# Patient Record
Sex: Female | Born: 1970 | Race: White | Hispanic: Yes | Marital: Married | Smoking: Never smoker
Health system: Southern US, Community
[De-identification: ages and names within clinical notes are randomized; demographics above are authoritative.]

## PROBLEM LIST (undated history)

## (undated) DIAGNOSIS — J45909 Unspecified asthma, uncomplicated: Secondary | ICD-10-CM

## (undated) DIAGNOSIS — T8859XA Other complications of anesthesia, initial encounter: Secondary | ICD-10-CM

## (undated) DIAGNOSIS — J40 Bronchitis, not specified as acute or chronic: Secondary | ICD-10-CM

## (undated) DIAGNOSIS — N882 Stricture and stenosis of cervix uteri: Secondary | ICD-10-CM

## (undated) DIAGNOSIS — N950 Postmenopausal bleeding: Secondary | ICD-10-CM

## (undated) DIAGNOSIS — R7303 Prediabetes: Secondary | ICD-10-CM

## (undated) DIAGNOSIS — I9581 Postprocedural hypotension: Secondary | ICD-10-CM

## (undated) DIAGNOSIS — D259 Leiomyoma of uterus, unspecified: Secondary | ICD-10-CM

## (undated) DIAGNOSIS — N939 Abnormal uterine and vaginal bleeding, unspecified: Secondary | ICD-10-CM

## (undated) DIAGNOSIS — E559 Vitamin D deficiency, unspecified: Secondary | ICD-10-CM

## (undated) DIAGNOSIS — K2970 Gastritis, unspecified, without bleeding: Secondary | ICD-10-CM

## (undated) DIAGNOSIS — E538 Deficiency of other specified B group vitamins: Secondary | ICD-10-CM

## (undated) DIAGNOSIS — D649 Anemia, unspecified: Secondary | ICD-10-CM

## (undated) HISTORY — PX: TONSILLECTOMY: SUR1361

## (undated) HISTORY — PX: KNEE SURGERY: SHX244

## (undated) HISTORY — PX: CHOLECYSTECTOMY: SHX55

## (undated) HISTORY — PX: OVARY SURGERY: SHX727

## (undated) HISTORY — DX: Vitamin D deficiency, unspecified: E55.9

## (undated) HISTORY — PX: LAPAROSCOPIC APPENDECTOMY: SHX408

## (undated) HISTORY — DX: Gastritis, unspecified, without bleeding: K29.70

## (undated) HISTORY — DX: Anemia, unspecified: D64.9

## (undated) HISTORY — PX: BREAST SURGERY: SHX581

## (undated) HISTORY — DX: Deficiency of other specified B group vitamins: E53.8

---

## 1993-04-02 HISTORY — PX: TONSILLECTOMY: SUR1361

## 1996-04-02 HISTORY — PX: EXCISION OF BREAST BIOPSY: SHX5822

## 1998-07-05 ENCOUNTER — Ambulatory Visit (HOSPITAL_COMMUNITY): Admission: RE | Admit: 1998-07-05 | Discharge: 1998-07-05 | Payer: Self-pay | Admitting: Family Medicine

## 1998-07-27 ENCOUNTER — Ambulatory Visit (HOSPITAL_COMMUNITY): Admission: RE | Admit: 1998-07-27 | Discharge: 1998-07-27 | Payer: Self-pay | Admitting: *Deleted

## 2000-02-19 ENCOUNTER — Ambulatory Visit (HOSPITAL_COMMUNITY): Admission: RE | Admit: 2000-02-19 | Discharge: 2000-02-19 | Payer: Self-pay | Admitting: *Deleted

## 2000-02-19 ENCOUNTER — Encounter: Payer: Self-pay | Admitting: *Deleted

## 2000-06-06 ENCOUNTER — Encounter: Payer: Self-pay | Admitting: Family Medicine

## 2000-06-06 ENCOUNTER — Encounter: Admission: RE | Admit: 2000-06-06 | Discharge: 2000-06-06 | Payer: Self-pay | Admitting: Family Medicine

## 2001-01-02 ENCOUNTER — Emergency Department (HOSPITAL_COMMUNITY): Admission: EM | Admit: 2001-01-02 | Discharge: 2001-01-02 | Payer: Self-pay | Admitting: Emergency Medicine

## 2001-06-06 ENCOUNTER — Encounter: Payer: Self-pay | Admitting: Family Medicine

## 2001-06-06 ENCOUNTER — Ambulatory Visit (HOSPITAL_COMMUNITY): Admission: RE | Admit: 2001-06-06 | Discharge: 2001-06-06 | Payer: Self-pay | Admitting: Family Medicine

## 2001-07-10 LAB — CONVERTED CEMR LAB: Pap Smear: NORMAL

## 2002-09-24 ENCOUNTER — Ambulatory Visit: Admission: EM | Admit: 2002-09-24 | Discharge: 2002-09-24 | Payer: Self-pay | Admitting: Emergency Medicine

## 2003-05-25 ENCOUNTER — Ambulatory Visit (HOSPITAL_COMMUNITY): Admission: RE | Admit: 2003-05-25 | Discharge: 2003-05-25 | Payer: Self-pay | Admitting: Orthopedic Surgery

## 2003-05-25 HISTORY — PX: KNEE ARTHROSCOPY: SUR90

## 2003-08-20 ENCOUNTER — Ambulatory Visit (HOSPITAL_COMMUNITY): Admission: AD | Admit: 2003-08-20 | Discharge: 2003-08-22 | Payer: Self-pay | Admitting: Orthopedic Surgery

## 2003-08-20 HISTORY — PX: KNEE ARTHROSCOPY W/ SYNOVECTOMY: SHX1887

## 2004-06-19 ENCOUNTER — Ambulatory Visit: Payer: Self-pay | Admitting: Family Medicine

## 2004-06-20 ENCOUNTER — Ambulatory Visit: Payer: Self-pay | Admitting: *Deleted

## 2004-07-07 ENCOUNTER — Ambulatory Visit: Payer: Self-pay | Admitting: Family Medicine

## 2004-07-25 ENCOUNTER — Ambulatory Visit: Payer: Self-pay | Admitting: Family Medicine

## 2004-09-12 ENCOUNTER — Ambulatory Visit: Payer: Self-pay | Admitting: Family Medicine

## 2004-12-20 ENCOUNTER — Ambulatory Visit: Payer: Self-pay | Admitting: Family Medicine

## 2005-05-04 ENCOUNTER — Ambulatory Visit: Payer: Self-pay | Admitting: Family Medicine

## 2005-06-04 ENCOUNTER — Ambulatory Visit: Payer: Self-pay | Admitting: Family Medicine

## 2005-06-06 ENCOUNTER — Ambulatory Visit: Payer: Self-pay | Admitting: Family Medicine

## 2005-06-07 ENCOUNTER — Ambulatory Visit (HOSPITAL_COMMUNITY): Admission: RE | Admit: 2005-06-07 | Discharge: 2005-06-07 | Payer: Self-pay | Admitting: Family Medicine

## 2005-11-01 ENCOUNTER — Ambulatory Visit: Payer: Self-pay | Admitting: Family Medicine

## 2005-11-06 ENCOUNTER — Encounter: Admission: RE | Admit: 2005-11-06 | Discharge: 2005-11-06 | Payer: Self-pay | Admitting: Family Medicine

## 2005-11-06 DIAGNOSIS — N63 Unspecified lump in unspecified breast: Secondary | ICD-10-CM

## 2006-06-18 ENCOUNTER — Ambulatory Visit: Payer: Self-pay | Admitting: Internal Medicine

## 2006-08-02 ENCOUNTER — Ambulatory Visit: Payer: Self-pay | Admitting: Internal Medicine

## 2006-08-09 ENCOUNTER — Ambulatory Visit (HOSPITAL_COMMUNITY): Admission: RE | Admit: 2006-08-09 | Discharge: 2006-08-09 | Payer: Self-pay | Admitting: Internal Medicine

## 2006-10-18 ENCOUNTER — Encounter (INDEPENDENT_AMBULATORY_CARE_PROVIDER_SITE_OTHER): Payer: Self-pay | Admitting: General Surgery

## 2006-10-18 ENCOUNTER — Ambulatory Visit (HOSPITAL_COMMUNITY): Admission: RE | Admit: 2006-10-18 | Discharge: 2006-10-19 | Payer: Self-pay | Admitting: General Surgery

## 2006-10-18 HISTORY — PX: CHOLECYSTECTOMY, LAPAROSCOPIC: SHX56

## 2006-12-18 ENCOUNTER — Encounter (INDEPENDENT_AMBULATORY_CARE_PROVIDER_SITE_OTHER): Payer: Self-pay | Admitting: *Deleted

## 2006-12-24 ENCOUNTER — Telehealth (INDEPENDENT_AMBULATORY_CARE_PROVIDER_SITE_OTHER): Payer: Self-pay | Admitting: *Deleted

## 2006-12-30 DIAGNOSIS — J45909 Unspecified asthma, uncomplicated: Secondary | ICD-10-CM

## 2006-12-30 DIAGNOSIS — M25569 Pain in unspecified knee: Secondary | ICD-10-CM

## 2006-12-30 DIAGNOSIS — K219 Gastro-esophageal reflux disease without esophagitis: Secondary | ICD-10-CM

## 2007-01-17 ENCOUNTER — Encounter (INDEPENDENT_AMBULATORY_CARE_PROVIDER_SITE_OTHER): Payer: Self-pay | Admitting: Internal Medicine

## 2007-01-17 ENCOUNTER — Ambulatory Visit: Payer: Self-pay | Admitting: Internal Medicine

## 2007-01-17 DIAGNOSIS — J309 Allergic rhinitis, unspecified: Secondary | ICD-10-CM | POA: Insufficient documentation

## 2007-01-17 LAB — CONVERTED CEMR LAB
Bilirubin Urine: NEGATIVE
Blood in Urine, dipstick: NEGATIVE
Glucose, Urine, Semiquant: NEGATIVE
Ketones, urine, test strip: NEGATIVE
pH: 7

## 2007-01-23 ENCOUNTER — Encounter (INDEPENDENT_AMBULATORY_CARE_PROVIDER_SITE_OTHER): Payer: Self-pay | Admitting: Internal Medicine

## 2007-01-23 ENCOUNTER — Encounter (INDEPENDENT_AMBULATORY_CARE_PROVIDER_SITE_OTHER): Payer: Self-pay | Admitting: *Deleted

## 2007-01-23 LAB — CONVERTED CEMR LAB
CO2: 22 meq/L (ref 19–32)
Chlamydia, DNA Probe: NEGATIVE
Cholesterol: 89 mg/dL (ref 0–200)
GC Probe Amp, Genital: NEGATIVE
Glucose, Bld: 84 mg/dL (ref 70–99)
HDL: 31 mg/dL — ABNORMAL LOW (ref >39)
Lymphocytes Relative: 42 % (ref 12–46)
Lymphs Abs: 1.8 10*3/uL (ref 0.7–3.3)
Neutrophils Relative %: 37 % — ABNORMAL LOW (ref 43–77)
Platelets: 260 10*3/uL (ref 150–400)
Potassium: 4.5 meq/L (ref 3.5–5.3)
Sodium: 140 meq/L (ref 135–145)
Total CHOL/HDL Ratio: 2.9
VLDL: 12 mg/dL (ref 0–40)
WBC: 4.2 10*3/uL (ref 4.0–10.5)

## 2007-02-02 DIAGNOSIS — D509 Iron deficiency anemia, unspecified: Secondary | ICD-10-CM

## 2007-02-06 LAB — CONVERTED CEMR LAB
Iron: 26 ug/dL — ABNORMAL LOW (ref 42–145)
UIBC: 388 ug/dL

## 2007-08-12 ENCOUNTER — Ambulatory Visit: Payer: Self-pay | Admitting: Internal Medicine

## 2007-08-12 DIAGNOSIS — R5383 Other fatigue: Secondary | ICD-10-CM

## 2007-08-12 DIAGNOSIS — R5381 Other malaise: Secondary | ICD-10-CM

## 2007-08-17 LAB — CONVERTED CEMR LAB
AST: 17 units/L (ref 0–37)
Alkaline Phosphatase: 51 units/L (ref 39–117)
BUN: 12 mg/dL (ref 6–23)
Basophils Relative: 1 % (ref 0–1)
Creatinine, Ser: 0.51 mg/dL (ref 0.40–1.20)
Eosinophils Absolute: 0.2 10*3/uL (ref 0.0–0.7)
Eosinophils Relative: 5 % (ref 0–5)
Glucose, Bld: 99 mg/dL (ref 70–99)
HCT: 41 % (ref 36.0–46.0)
MCHC: 31.2 g/dL (ref 30.0–36.0)
MCV: 92.1 fL (ref 78.0–100.0)
Monocytes Relative: 14 % — ABNORMAL HIGH (ref 3–12)
Neutrophils Relative %: 41 % — ABNORMAL LOW (ref 43–77)
Potassium: 4.4 meq/L (ref 3.5–5.3)
RBC: 4.45 M/uL (ref 3.87–5.11)
Total Bilirubin: 0.6 mg/dL (ref 0.3–1.2)

## 2007-08-18 ENCOUNTER — Encounter (INDEPENDENT_AMBULATORY_CARE_PROVIDER_SITE_OTHER): Payer: Self-pay | Admitting: *Deleted

## 2007-09-02 ENCOUNTER — Telehealth (INDEPENDENT_AMBULATORY_CARE_PROVIDER_SITE_OTHER): Payer: Self-pay | Admitting: Internal Medicine

## 2007-11-18 ENCOUNTER — Telehealth (INDEPENDENT_AMBULATORY_CARE_PROVIDER_SITE_OTHER): Payer: Self-pay | Admitting: Internal Medicine

## 2007-11-19 ENCOUNTER — Ambulatory Visit: Payer: Self-pay | Admitting: Family Medicine

## 2007-11-19 DIAGNOSIS — M940 Chondrocostal junction syndrome [Tietze]: Secondary | ICD-10-CM

## 2008-05-24 ENCOUNTER — Telehealth (INDEPENDENT_AMBULATORY_CARE_PROVIDER_SITE_OTHER): Payer: Self-pay | Admitting: Internal Medicine

## 2008-05-25 ENCOUNTER — Ambulatory Visit: Payer: Self-pay | Admitting: Internal Medicine

## 2008-05-25 DIAGNOSIS — J069 Acute upper respiratory infection, unspecified: Secondary | ICD-10-CM | POA: Insufficient documentation

## 2008-08-11 ENCOUNTER — Ambulatory Visit: Payer: Self-pay | Admitting: Nurse Practitioner

## 2008-08-11 DIAGNOSIS — J020 Streptococcal pharyngitis: Secondary | ICD-10-CM | POA: Insufficient documentation

## 2008-08-11 LAB — CONVERTED CEMR LAB: Rapid Strep: POSITIVE

## 2009-04-22 ENCOUNTER — Emergency Department (HOSPITAL_COMMUNITY): Admission: EM | Admit: 2009-04-22 | Discharge: 2009-04-22 | Payer: Self-pay | Admitting: Emergency Medicine

## 2009-04-26 ENCOUNTER — Ambulatory Visit: Payer: Self-pay | Admitting: Internal Medicine

## 2009-05-26 ENCOUNTER — Ambulatory Visit: Payer: Self-pay | Admitting: Internal Medicine

## 2009-05-26 DIAGNOSIS — L0293 Carbuncle, unspecified: Secondary | ICD-10-CM

## 2009-05-26 DIAGNOSIS — L0292 Furuncle, unspecified: Secondary | ICD-10-CM | POA: Insufficient documentation

## 2009-05-26 LAB — CONVERTED CEMR LAB
Bilirubin Urine: NEGATIVE
Glucose, Urine, Semiquant: NEGATIVE
KOH Prep: NEGATIVE
Ketones, urine, test strip: NEGATIVE
Pap Smear: NEGATIVE
Protein, U semiquant: NEGATIVE
Whiff Test: NEGATIVE
pH: 7.5

## 2009-06-04 LAB — CONVERTED CEMR LAB
AST: 17 units/L (ref 0–37)
Alkaline Phosphatase: 51 units/L (ref 39–117)
BUN: 11 mg/dL (ref 6–23)
Basophils Absolute: 0 10*3/uL (ref 0.0–0.1)
Basophils Relative: 1 % (ref 0–1)
Chlamydia, DNA Probe: NEGATIVE
Eosinophils Relative: 3 % (ref 0–5)
GC Probe Amp, Genital: NEGATIVE
Glucose, Bld: 86 mg/dL (ref 70–99)
Hemoglobin: 10.8 g/dL — ABNORMAL LOW (ref 12.0–15.0)
Lymphocytes Relative: 42 % (ref 12–46)
MCHC: 30.3 g/dL (ref 30.0–36.0)
Monocytes Absolute: 0.7 10*3/uL (ref 0.1–1.0)
Neutro Abs: 2.3 10*3/uL (ref 1.7–7.7)
Platelets: 287 10*3/uL (ref 150–400)
Potassium: 4.3 meq/L (ref 3.5–5.3)
RDW: 14.2 % (ref 11.5–15.5)
Sodium: 139 meq/L (ref 135–145)
Total Bilirubin: 0.4 mg/dL (ref 0.3–1.2)

## 2009-12-19 ENCOUNTER — Ambulatory Visit: Payer: Self-pay | Admitting: Internal Medicine

## 2009-12-19 DIAGNOSIS — R21 Rash and other nonspecific skin eruption: Secondary | ICD-10-CM

## 2009-12-30 ENCOUNTER — Ambulatory Visit: Payer: Self-pay | Admitting: Internal Medicine

## 2010-05-02 NOTE — Progress Notes (Signed)
Summary: Office Visit/DEPRESSION SCREENING  Office Visit/DEPRESSION SCREENING   Imported By: Arta Bruce 07/19/2009 14:20:20  _____________________________________________________________________  External Attachment:    Type:   Image     Comment:   External Document

## 2010-05-02 NOTE — Assessment & Plan Note (Signed)
Summary: rash///cns   Vital Signs:  Patient profile:   40 year old female Menstrual status:  regular LMP:     12/05/2009 Weight:      182.6 pounds Temp:     97.6 degrees F Pulse rate:   78 / minute BP sitting:   114 / 78  (left arm) Cuff size:   large  Vitals Entered By: Michelle Nasuti (December 30, 2009 11:55 AM) CC: rash on breast. seen last friday. pt states rash is doing much better. c/o L leg pain Pain Assessment Patient in pain? yes      Intensity: 8 LMP (date): 12/05/2009 LMP - Character: heavy Menarche (age onset years): 15   Menses interval (days): 28 Menstrual flow (days): 5 Enter LMP: 12/05/2009 Last PAP Result NEGATIVE FOR INTRAEPITHELIAL LESIONS OR MALIGNANCY.   Primary Care Silvester Reierson:  Julieanne Manson MD  CC:  rash on breast. seen last friday. pt states rash is doing much better. c/o L leg pain.  History of Present Illness: 1.  Rash:  much better.  Was much improved after 2-3 days of treatment.  2.  Left leg pain:  Started 3 days ago.  Feels like the pain is in her muscle.  Sore to press on quadriceps area and when she walks.  Does believe she overdid with squatting down, bending too much before started.  Also has some discomfort in gluteal and lateral thigh area as well.  Not really making her change her gait.  Took Naproxen -- 2 of OTC brand.  Helped only a bit.  No back pain.    Allergies: No Known Drug Allergies  Physical Exam  Extremities:  Full ROM of hip and knee, but some anterior discomfort with knee flexion.  Quadriceps group tender on palpation.  No overlying swelling, erythema or significant contusion.  No palpable abnormality. Minimal tenderness over greater trochanter  Skin:  Rash about chest resolved   Impression & Recommendations:  Problem # 1:  SKIN RASH (ICD-782.1) Resolved  Problem # 2:  QUADRICEPS STRAIN (ICD-844.8)-left Warm packs and gentle stretching. Naproxen 500 mg two times a day with food as needed   Complete  Medication List: 1)  Albuterol Sulfate (2.5 Mg/13ml) 0.083% Nebu (Albuterol sulfate) .... 2 puffs q4 h as needed wheezing 2)  Advair Diskus 100-50 Mcg/dose Misc (Fluticasone-salmeterol) .Marland Kitchen.. 1 inhalation two times a day 3)  Nasacort Aq 55 Mcg/act Aers (Triamcinolone acetonide(nasal)) .... 2 sprays each nostril daily 4)  Singulair 10 Mg Tabs (Montelukast sodium) .Marland Kitchen.. 1 tab by mouth daily 5)  Ferrous Sulfate 325 (65 Fe) Mg Tabs (Ferrous sulfate) .Marland Kitchen.. 1 tab by mouth daily 6)  Fexofenadine Hcl 180 Mg Tabs (Fexofenadine hcl) .Marland Kitchen.. 1 tab by mouth daily as needed allergies. 7)  Naproxen 500 Mg Tabs (Naproxen) .Marland Kitchen.. 1 tab by mouth two times a day with food as needed pain 8)  Hydroxyzine Hcl 25 Mg Tabs (Hydroxyzine hcl) .... Take 1 tablet by mouth two times a day as needed for severe itching  Patient Instructions: 1)  Warm compresses or soaks for 20 minutes two times a day to leg 2)  Call if it does not improve in next week--gentle stretching as we discussed Prescriptions: NAPROXEN 500 MG TABS (NAPROXEN) 1 tab by mouth two times a day with food as needed pain  #60 x 1   Entered and Authorized by:   Julieanne Manson MD   Signed by:   Julieanne Manson MD on 12/30/2009   Method used:   Electronically to  [facility] [address] [phone]* (retail)       [address]       [place], [place]  [address]       Ph: [phone]       Fax: [phone]   RxID:   [id number]

## 2010-05-02 NOTE — Assessment & Plan Note (Signed)
Summary: BLISTER UNDER LEFT FOOT///KT   Vital Signs:  Patient profile:   40 year old female Menstrual status:  regular Weight:      185.2 pounds BMI:     31.90 Temp:     97.9 degrees F oral Pulse rate:   71 / minute Pulse rhythm:   regular Resp:     18 per minute BP sitting:   116 / 81  (left arm) Cuff size:   regular  Vitals Entered By: Geanie Cooley (April 26, 2009 4:27 PM) CC: Wart under left big toe for about 2 months smaller one forming on same foot. Is Patient Diabetic? No Pain Assessment Patient in pain? yes     Location: foot Intensity: 10 Type: stinging Onset of pain  two months  Does patient need assistance? Functional Status Self care Ambulation Normal   CC:  Wart under left big toe for about 2 months smaller one forming on same foot..  Allergies (verified): No Known Drug Allergies  Physical Exam  Skin:  1 cm calloused area with depressed center and black speckling in center on plantar aspect of left great toe.  Smaller similar lesion on ball of foot .   Impression & Recommendations:  Problem # 1:  PLANTAR WARTS, LEFT (ICD-078.12) Salicylic acid plasters--may use up to 12 weeks.  Complete Medication List: 1)  Albuterol Sulfate (2.5 Mg/35ml) 0.083% Nebu (Albuterol sulfate) .... 2 puffs q4 h as needed wheezing 2)  Advair Diskus 100-50 Mcg/dose Misc (Fluticasone-salmeterol) .Marland Kitchen.. 1 inhalation two times a day 3)  Nasacort Aq 55 Mcg/act Aers (Triamcinolone acetonide(nasal)) .... 2 sprays each nostril daily 4)  Singulair 10 Mg Tabs (Montelukast sodium) .Marland Kitchen.. 1 tab by mouth daily 5)  Ferrous Sulfate 325 (65 Fe) Mg Tabs (Ferrous sulfate) .Marland Kitchen.. 1 tab by mouth two times a day 6)  Fexofenadine Hcl 180 Mg Tabs (Fexofenadine hcl) .Marland Kitchen.. 1 tab by mouth daily as needed allergies. 7)  Naprosyn 500 Mg Tabs (Naproxen) .... Take one tablet every 12 hours for chest wall pain. take with food  Patient Instructions: 1)  Salicylic acid 40% plasters--not the discs on  bandaids--cut to fit wart--replace each day after shower and pumice stone to remove the soft white skin 2)  Pt. brought up heavy periods on way out--she will make appt. for CPP next available.  Prevention & Chronic Care Immunizations   Influenza vaccine: Fluvax 3+  (01/17/2007)    Tetanus booster: 06/18/2006: Td    Pneumococcal vaccine: Pneumovax  (01/17/2007)  Other Screening   Pap smear: normal  (07/10/2001)   Smoking status: never  (01/17/2007)  Lipids   Total Cholesterol: 89  (01/17/2007)   LDL: 46  (01/17/2007)   LDL Direct: Not documented   HDL: 31  (01/17/2007)   Triglycerides: 58  (01/17/2007)    Vital Signs:  Patient profile:   40 year old female Menstrual status:  regular Weight:      185.2 pounds BMI:     31.90 Temp:     97.9 degrees F oral Pulse rate:   71 / minute Pulse rhythm:   regular Resp:     18 per minute BP sitting:   116 / 81  (left arm) Cuff size:   regular  Vitals Entered By: Geanie Cooley (April 26, 2009 4:27 PM)

## 2010-05-02 NOTE — Assessment & Plan Note (Signed)
Summary: CPP/////RJP   Vital Signs:  Patient profile:   40 year old female Menstrual status:  regular LMP:     05/20/2009 Weight:      187.3 pounds Temp:     97.8 degrees F oral Pulse rate:   75 / minute Pulse rhythm:   regular Resp:     18 per minute BP sitting:   106 / 71  (left arm) Cuff size:   regular  Vitals Entered By: Geanie Cooley  (May 26, 2009 11:45 AM) CC: Pt here for CPP.  Pt states she has a big pimple under left butt cheek. Pt states she still has  pain under her left foot from the wart. Is Patient Diabetic? No Pain Assessment Patient in pain? yes     Location: bottom of left foot Intensity: 8 Type: stinging  Does patient need assistance? Functional Status Self care Ambulation Normal LMP (date): 05/20/2009 LMP - Character: heavy Menarche (age onset years): 15   Menses interval (days): 28 Menstrual flow (days): 5 Enter LMP: 05/20/2009 Last PAP Result normal                                          Primary Care Provider:  nd no SOB.  CC:  Pt here for CPP.  Pt states she has a big pimple under left butt cheek. Pt states she still has  pain under her left foot from the wart..  History of Present Illness: 40 yo female here for CPP  Concerns:  1.  Pimple on left buttock--has gradually enlarged over 5 days time.  3 days ago, she expressed a lot of pus from it and has enlarged and with much more discomfort.  Expressed pus again today in the shower.  2.  Plantar Wart:  Did get the plasters, but is leaving the wart open to air at times instead of reapplying a new plaster--discussed it would take longer.  Habits & Providers  Alcohol-Tobacco-Diet     Alcohol drinks/day: 0     Tobacco Status: never  Exercise-Depression-Behavior     Drug Use: never  Allergies (verified): No Known Drug Allergies  Past History:  Past Medical History: Reviewed history from 01/17/2007 and no changes required. ALLERGIC RHINITIS (ICD-477.9) ANEMIA, IRON  DEFICIENCY, HX OF (ICD-V12.3) WELL ADULT EXAM (ICD-V70.0) POSITIVE PPD (ICD-795.5) LUMP OR MASS IN BREAST (ICD-611.72) PATELLO-FEMORAL SYNDROME (ICD-719.46) GERD (ICD-530.81) ASTHMA (ICD-493.90)  Past Surgical History: Reviewed history from 01/17/2007 and no changes required. 1. 1997 (last) Csection  x 3 2.  1995  Tonsillectomy 3.  Age 13   Benign ovarian tumor resection--[place] 4.  1998  Left breast biopsy-benign--[place] 5.  1999 Left breast biopsy --benign 6.  05/2003  Tendon Repair Right Knee 7.  09/2003  Arthroscopic surgery right Knee. 8.  10/18/06  Laparoscopic Cholecystectomy.  Family History: Mother, 46, hypertension Father, 54, BPH 8 Sisters--one with Diabetes 4 Brothers--all healthy 3 children, ages 25, 12,13--19 yo son with allergies and asthma  Social History: Lives at home with 3 sons and husband. Works at [employer] job Moved to [place]. last in 2001Drug Use:  never  Review of Systems General:  Most of the time okay.  . Eyes:  Denies blurring. ENT:  Denies decreased hearing. CV:  Denies chest pain or discomfort and palpitations. Resp:  Occasionally problems with allergies and breathing. GI:  Denies bloody stools and dark tarry stools; Epigastric  pain sometimes:  Feels bloated.  If belches or passes flatus or stool, feels better.  Does have constipation and is definitely worse when she has the abdominal pain--Uses Metamucil and helps, but does not use daily.. GU:  Denies discharge, dysuria, and urinary frequency. MS:  Denies joint pain, joint redness, and joint swelling. Derm:  Denies lesion(s) and rash; Other than what noted in HPI. Neuro:  Denies numbness, tingling, and weakness. Psych:  Denies anxiety, depression, and suicidal thoughts/plans; Depression questionairre scored a "0".  Physical Exam  General:  overweight ,in no acute distress; alert,appropriate and cooperative throughout examination Head:  Normocephalic and atraumatic without  obvious abnormalities. No apparent alopecia or balding. Eyes:  No corneal or conjunctival inflammation noted. EOMI. Perrla. Funduscopic exam benign, without hemorrhages, exudates or papilledema. Vision grossly normal. Ears:  External ear exam shows no significant lesions or deformities.  Otoscopic examination reveals clear canals, tympanic membranes are intact bilaterally without bulging, retraction, inflammation or discharge. Hearing is grossly normal bilaterally. Nose:  External nasal examination shows no deformity or inflammation. Nasal mucosa are pink and moist without lesions or exudates. Mouth:  Oral mucosa and oropharynx without lesions or exudates.  Teeth in good repair. Neck:  No deformities, masses, or tenderness noted. Breasts:  No mass, nodules, thickening, tenderness, bulging, retraction, inflamation, nipple discharge or skin changes noted.   Lungs:  Normal respiratory effort, chest expands symmetrically. Lungs are clear to auscultation, no crackles or wheezes. Heart:  Normal rate and regular rhythm. S1 and S2 normal without gallop, murmur, click, rub or other extra sounds. Abdomen:  Bowel sounds positive,abdomen soft and non-tender without masses, organomegaly or hernias noted.  Surgical scars well healed Genitalia:  Pelvic Exam:        External: normal female genitalia without lesions or masses        Vagina: normal without lesions or masses        Cervix: normal without lesions or masses        Adnexa: normal bimanual exam without masses or fullness        Uterus: normal by palpation        Pap smear: performed Msk:  No deformity or scoliosis noted of thoracic or lumbar spine.   Pulses:  R and L carotid,radial,femoral,dorsalis pedis and posterior tibial pulses are full and equal bilaterally Extremities:  No clubbing, cyanosis, edema, or deformity noted with normal full range of motion of all joints.   Plantar aspect of left great toe with softening, white appearance about wart  where has been applying salicylic acid plaster. Neurologic:  No cranial nerve deficits noted. Station and gait are normal. Plantar reflexes are down-going bilaterally. DTRs are symmetrical throughout. Sensory, motor and coordinative functions appear intact. Skin:  small furuncles, left buttock--no current fluctuance. Cervical Nodes:  No lymphadenopathy noted Axillary Nodes:  No palpable lymphadenopathy Inguinal Nodes:  No significant adenopathy Psych:  Cognition and judgment appear intact. Alert and cooperative with normal attention span and concentration. No apparent delusions, illusions, hallucinations   Impression & Recommendations:  Problem # 1:  ROUTINE GYNECOLOGICAL EXAMINATION (ICD-V72.31)  Orders: T- GC Chlamydia ([address]109) T-HIV Antibody  (Reflex) ([id number]) T-Pap Smear, Thin Prep (91478) T-Syphilis Test (RPR) [phone]) KOH/ WET Mount [phone]) UA Dipstick w/o Micro (manual) (81002) Pap Smear, Thin Prep ( Collection of) (N6295)  Problem # 2:  HEALTH MAINTENANCE EXAM (ICD-V70.0) flu shot today Orders: T-Comprehensive Metabolic Panel ([id number])  Problem # 3:  PLANTAR WARTS, LEFT (ICD-078.12) To use salicylic acid plasters continuously untlil can  remove wart--no longer than 12 weeks.  Problem # 4:  FURUNCLE (ICD-680.9) warm packs--call if worsen--avoid squeezing. Cephalexin for short course.  Complete Medication List: 1)  Albuterol Sulfate (2.5 Mg/72ml) 0.083% Nebu (Albuterol sulfate) .... 2 puffs q4 h as needed wheezing 2)  Advair Diskus 100-50 Mcg/dose Misc (Fluticasone-salmeterol) .Marland Kitchen.. 1 inhalation two times a day 3)  Nasacort Aq 55 Mcg/act Aers (Triamcinolone acetonide(nasal)) .... 2 sprays each nostril daily 4)  Singulair 10 Mg Tabs (Montelukast sodium) .Marland Kitchen.. 1 tab by mouth daily 5)  Ferrous Sulfate 325 (65 Fe) Mg Tabs (Ferrous sulfate) .Marland Kitchen.. 1 tab by mouth daily 6)  Fexofenadine Hcl 180 Mg Tabs (Fexofenadine hcl) .Marland Kitchen.. 1 tab by mouth daily as needed allergies. 7)   Naprosyn 500 Mg Tabs (Naproxen) .... Take one tablet every 12 hours for chest wall pain. take with food  Other Orders: T-CBC w/Diff ([id number])  Patient Instructions: 1)  1 year for CPP with Dr. Smitty Pluck for appt. 2)  Get your flu shot in October or November of each year Prescriptions: CEPHALEXIN 250 MG CAPS (CEPHALEXIN) 1 cap by mouth 4 times daily for 5 days  #20 x 0   Entered and Authorized by:   Julieanne Manson MD   Signed by:   Julieanne Manson MD on 05/26/2009   Method used:   Print then Give to Patient   RxID:   [id number]    Preventive Care Screening  Pap Smear:    Date:  01/17/2007    Results:  normal   Prior Values:    Pap Smear:  normal (07/10/2001)    Last Tetanus Booster:  Td (06/18/2006)    Last Flu Shot:  Fluvax 3+ (01/17/2007)    Last Pneumovax:  Pneumovax (01/17/2007)     Immunizations:  has not had flu vaccine Guiac Cards/Colonoscopy:  never Mammogram:  has had three times before--left breast mass--benign SBE:  never. LMP:  05/20/09 Osteoprevention:  2 servings of dairy daily.  No exercise.   Vital Signs:  Patient profile:   40 year old female Menstrual status:  regular LMP:     05/20/2009 Weight:      187.3 pounds Temp:     97.8 degrees F oral Pulse rate:   75 / minute Pulse rhythm:   regular Resp:     18 per minute BP sitting:   106 / 71  (left arm) Cuff size:   regular  Vitals Entered By: Geanie Cooley  (May 26, 2009 11:45 AM)   Laboratory Results   Urine Tests  Date/Time Received: May 26, 2009 12:10 PM   Routine Urinalysis   Color: lt. yellow Glucose: negative   (Normal Range: Negative) Bilirubin: negative   (Normal Range: Negative) Ketone: negative   (Normal Range: Negative) Spec. Gravity: <1.005   (Normal Range: 1.003-1.035) Blood: trace-lysed   (Normal Range: Negative) pH: 7.5   (Normal Range: 5.0-8.0) Protein: negative   (Normal Range: Negative) Urobilinogen: 0.2   (Normal Range: 0-1) Nitrite:  negative   (Normal Range: Negative) Leukocyte Esterace: negative   (Normal Range: Negative)      Wet Mount Source: vaginal WBC/hpf: 1-5 Bacteria/hpf: 1+ Clue cells/hpf: none  Negative whiff Yeast/hpf: none Wet Mount KOH: Negative Trichomonas/hpf: none

## 2010-05-02 NOTE — Assessment & Plan Note (Signed)
Summary: Rash   Vital Signs:  Patient profile:   40 year old female Menstrual status:  regular Height:      64 inches Weight:      186 pounds BMI:     32.04 Temp:     98.0 degrees F oral Pulse rate:   72 / minute Pulse rhythm:   regular Resp:     18 per minute BP sitting:   107 / 72  (left arm)  Vitals Entered By: CMA Student Linzie Collin  CC: office visit for rash in left breast area, itches,onset five days, patient has tried OTC Calamin topical solution, wal-zyr for allergy and Cortaid,medications verified  patient not currently taking any prescribed medications Is Patient Diabetic? No Pain Assessment Patient in pain? no       Does patient need assistance? Functional Status Self care Ambulation Normal   Primary Care Provider:  Julieanne Manson MD  CC:  office visit for rash in left breast area, itches, onset five days, patient has tried OTC Calamin topical solution, wal-zyr for allergy and Cortaid, and medications verified  patient not currently taking any prescribed medications.  History of Present Illness: Rash under left breast x 5 days.   +pruritic Not spreading. No contact with plants or new detergent.  No new soap.  No new lotions or perfumes. Has never had before. Taking calamine lotion and generic zyrtec.  Helps a little.  Has gotten smaller with it. No fevers or chills. No n/v/d.  Has a cough from allergies. Admits she tried on some new clothes the day before she broke out.  No new medicines.  No other new clothes.  Has worked at [employer] for 1 year.  Notes there is a heater on her left side.   Problems Prior to Update: 1)  Furuncle  (ICD-680.9) 2)  Routine Gynecological Examination  (ICD-V72.31) 3)  Health Maintenance Exam  (ICD-V70.0) 4)  Plantar Warts, Left  (ICD-078.12) 5)  Pharyngitis, Streptococcal  (ICD-034.0) 6)  Viral Uri  (ICD-465.9) 7)  Costochondritis, Left  (ICD-733.6) 8)  Fatigue  (ICD-780.79) 9)  Anemia, Iron Deficiency   (ICD-280.9) 10)  Allergic Rhinitis  (ICD-477.9) 11)  Well Adult Exam  (ICD-V70.0) 12)  Positive Ppd  (ICD-795.5) 13)  Lump or Mass in Breast  (ICD-611.72) 14)  Patello-femoral Syndrome  (ICD-719.46) 15)  Gerd  (ICD-530.81) 16)  Asthma  (ICD-493.90)  Allergies (verified): No Known Drug Allergies  Past History:  Past Medical History: Last updated: 01/17/2007 ALLERGIC RHINITIS (ICD-477.9) ANEMIA, IRON DEFICIENCY, HX OF (ICD-V12.3) WELL ADULT EXAM (ICD-V70.0) POSITIVE PPD (ICD-795.5) LUMP OR MASS IN BREAST (ICD-611.72) PATELLO-FEMORAL SYNDROME (ICD-719.46) GERD (ICD-530.81) ASTHMA (ICD-493.90)  Past Surgical History: Last updated: 01/17/2007 1. 1997 (last) Csection  x 3 2.  1995  Tonsillectomy 3.  Age 26   Benign ovarian tumor resection--[place] 4.  1998  Left breast biopsy-benign--[place] 5.  1999 Left breast biopsy --benign 6.  05/2003  Tendon Repair Right Knee 7.  09/2003  Arthroscopic surgery right Knee. 8.  10/18/06  Laparoscopic Cholecystectomy.  Physical Exam  General:  alert, well-developed, and well-nourished.   Head:  normocephalic and atraumatic.   Neck:  supple.   Breasts:  left breast without masses, nipple d/c and no axillary adenopathy Lungs:  normal breath sounds.   Heart:  normal rate and regular rhythm.   Neurologic:  alert & oriented X3 and cranial nerves II-XII intact.   Skin:  diffuse macular rash on breast with some excoriation noted has some spreading to  chest above and below and has some spreading to sternal area  Psych:  normally interactive.     Impression & Recommendations:  Problem # 1:  SKIN RASH (ICD-782.1) contact dermatitis ? from new clothes she tried on fairly widespread on breast and fairly pruritic will give prednisone taper will give atarax as needed  follow up in one week  Complete Medication List: 1)  Albuterol Sulfate (2.5 Mg/71ml) 0.083% Nebu (Albuterol sulfate) .... 2 puffs q4 h as needed wheezing 2)  Advair Diskus 100-50  Mcg/dose Misc (Fluticasone-salmeterol) .Marland Kitchen.. 1 inhalation two times a day 3)  Nasacort Aq 55 Mcg/act Aers (Triamcinolone acetonide(nasal)) .... 2 sprays each nostril daily 4)  Singulair 10 Mg Tabs (Montelukast sodium) .Marland Kitchen.. 1 tab by mouth daily 5)  Ferrous Sulfate 325 (65 Fe) Mg Tabs (Ferrous sulfate) .Marland Kitchen.. 1 tab by mouth daily 6)  Fexofenadine Hcl 180 Mg Tabs (Fexofenadine hcl) .Marland Kitchen.. 1 tab by mouth daily as needed allergies. 7)  Naprosyn 500 Mg Tabs (Naproxen) .... Take one tablet every 12 hours for chest wall pain. take with food 8)  Prednisone 10 Mg Tabs (Prednisone) .... Take 6 tabs today, 5 on tues, 4 on wed, 3 on thurs., 2 on fri, 2 on sat, 1 on sun., 1 on monday and then stop 9)  Hydroxyzine Hcl 25 Mg Tabs (Hydroxyzine hcl) .... Take 1 tablet by mouth two times a day as needed for severe itching  Patient Instructions: 1)  You can either take the Cetirizine (the pills you bought) once daily as needed for itching OR you can get the Hydroxyzine 25 mg filled and take two times a day as needed for itching.  The hydroxyzine is much stronger and will make you drowsy.  Do not take these 2 medicines together on the same day. 2)  Take the prednisone until all gone. 3)  Schedule follow up in one week to recheck your rash with Dr. Delrae Alfred (or Lorin Picket if not available). Prescriptions: HYDROXYZINE HCL 25 MG TABS (HYDROXYZINE HCL) Take 1 tablet by mouth two times a day as needed for severe itching  #20 x 0   Entered and Authorized by:   Tereso Newcomer PA-C   Signed by:   Tereso Newcomer PA-C on 12/19/2009   Method used:   Print then Give to Patient   RxID:   [id number] PREDNISONE 10 MG TABS (PREDNISONE) Take 6 tabs today, 5 on Tues, 4 on Wed, 3 on Thurs., 2 on Fri, 2 on Sat, 1 on Sun., 1 on Monday and then stop  #24 x 0   Entered and Authorized by:   Tereso Newcomer PA-C   Signed by:   Tereso Newcomer PA-C on 12/19/2009   Method used:   Print then Give to Patient   RxID:   [id number]

## 2010-06-18 LAB — URINALYSIS, ROUTINE W REFLEX MICROSCOPIC
Glucose, UA: NEGATIVE mg/dL
Leukocytes, UA: NEGATIVE
Nitrite: NEGATIVE
Specific Gravity, Urine: 1.014 (ref 1.005–1.030)
pH: 8 (ref 5.0–8.0)

## 2010-06-18 LAB — DIFFERENTIAL
Eosinophils Absolute: 0.1 10*3/uL (ref 0.0–0.7)
Lymphs Abs: 2 10*3/uL (ref 0.7–4.0)
Monocytes Absolute: 0.9 10*3/uL (ref 0.1–1.0)
Monocytes Relative: 15 % — ABNORMAL HIGH (ref 3–12)
Neutrophils Relative %: 47 % (ref 43–77)

## 2010-06-18 LAB — WET PREP, GENITAL

## 2010-06-18 LAB — CBC
Hemoglobin: 11.5 g/dL — ABNORMAL LOW (ref 12.0–15.0)
MCV: 86 fL (ref 78.0–100.0)
RBC: 3.97 MIL/uL (ref 3.87–5.11)
WBC: 5.8 10*3/uL (ref 4.0–10.5)

## 2010-06-18 LAB — URINE MICROSCOPIC-ADD ON

## 2010-06-18 LAB — GC/CHLAMYDIA PROBE AMP, GENITAL: Chlamydia, DNA Probe: NEGATIVE

## 2010-06-18 LAB — BASIC METABOLIC PANEL
Chloride: 107 mEq/L (ref 96–112)
Creatinine, Ser: 0.42 mg/dL (ref 0.4–1.2)
GFR calc Af Amer: >60 mL/min (ref >60)
Potassium: 3.8 mEq/L (ref 3.5–5.1)
Sodium: 136 mEq/L (ref 135–145)

## 2010-08-15 NOTE — Op Note (Signed)
NAMEADI, SEALES           ACCOUNT NO.:  [id number]   MEDICAL RECORD NO.:  [id number]          PATIENT TYPE:  OIB   LOCATION:  6737                         FACILITY:  [facility]   PHYSICIAN:  Gabrielle Dare. Janee Morn, M.D.DATE OF BIRTH:  1970/09/21   DATE OF PROCEDURE:  10/18/2006  DATE OF DISCHARGE:                               OPERATIVE REPORT   PREOPERATIVE DIAGNOSIS:  Symptomatic cholelithiasis.   POSTOPERATIVE DIAGNOSIS:  Symptomatic cholelithiasis.   PROCEDURE:  Laparoscopic cholecystectomy with intraoperative  cholangiogram.   SURGEON:  Gabrielle Dare. Janee Morn, M.D.   ASSISTANT:  Clovis Pu. Cornett, M.D.   ANESTHESIA:  General.   HISTORY OF PRESENT ILLNESS:  Miss Chieffo is a 40 year old Hispanic  female, whom I evaluated in the office for episodic right upper quadrant  pain.  Workup had demonstrated a large gallstone.  She presents today  for elective cholecystectomy.   PROCEDURE IN DETAIL:  Informed consent was obtained by myself in  Spanish.  The patient received intravenous antibiotics.  She was brought  to the operating room and general anesthesia was administered by the  anesthesia staff.  Her abdomen was prepped and draped in a sterile  fashion.  Marcaine 0.25% was injected in the infraumbilical region.  An  infraumbilical incision was made.  Subcutaneous tissues were dissected  down, revealing the anterior fascia.  This was divided sharply.  The  peritoneal cavity was entered under direct vision without difficulty.  A  0 Vicryl pursestring suture was placed around the fascial opening and  the Hasson trocar was inserted into the abdomen.  The abdomen was  insufflated with carbon dioxide in the standard fashion.  Under direct  vision, an 11-mm epigastric and two 5-mm lateral ports were placed.  Marcaine 0.25% with epinephrine was used at all the port sites.  The  dome of the gallbladder was retracted superomedially.  Some filmy  adhesions from the infundibulum were  swept away.  The infundibulum was  retracted inferolaterally.  Dissection began laterally and progressed  medially, easily identifying the cystic duct.  There was a large lymph  node of Calot.  The cystic duct was  dissected until a large window was  created between the cystic duct, the infundibulum and the liver.  Once  this was done, with excellent visualization, a clip was placed on the  infundibulum-cystic duct junction.  A small nick was made in the cystic  duct, and a Reddick cholangiogram catheter was inserted.  An  intraoperative cholangiogram was obtained, demonstrating no common bile  duct filling defects and good flow of contrast into the duodenum.  There  was a good length of cystic duct present.  The cholangiogram catheter  was removed.  Three clips were placed proximally on the cystic duct and  it was divided.  Further dissection revealed the cystic artery.  This  was clipped 3 times proximally, once distally and divided.  The  gallbladder was taken off the liver bed with the Bovie cautery, getting  excellent hemostasis.  One other small vessel was clipped once  proximally and divided distally with cautery.  The gallbladder was  placed in an  EndoCatch bag.  The liver bed was cauterized to get  excellent hemostasis.  The clips were inspected and were in good  position.  The abdomen was irrigated.  The irrigation fluid returned  clear.  The gallbladder was taken out of the abdomen via the  infraumbilical port site.  The infraumbilical fascia was closed by tying  the 0 Vicryl suture under direct vision, with care not to trap any intra-  abdominal contents.  The liver bed was rechecked and remained dry.  Clips remained in good position.  The residual irrigation fluid was  evacuated and it was clear.  The ports were removed under direct vision.  The pneumoperitoneum was released.  All 4 wounds were copiously  irrigated and the skin of each was closed with a running 4-0 Vicryl   subcuticular stitch.  Sponge, needle and instrument counts were correct.  Benzoin and Steri-Strips and sterile dressings were applied.  The  patient tolerated the procedure well without apparent complication, and  was taken to the recovery room in stable condition.      Gabrielle Dare Janee Morn, M.D.  Electronically Signed     BET/MEDQ  D:  10/18/2006  T:  10/18/2006  Job:  [id number]   cc:   Marcene Duos, M.D.

## 2010-08-15 NOTE — Discharge Summary (Signed)
NAMEKRISTIANN, NOYCE           ACCOUNT NO.:  [id number]   MEDICAL RECORD NO.:  [id number]          PATIENT TYPE:  OIB   LOCATION:  6737                         FACILITY:  [facility]   PHYSICIAN:  Gabrielle Dare. Janee Morn, M.D.DATE OF BIRTH:  1970/05/24   DATE OF ADMISSION:  10/18/2006  DATE OF DISCHARGE:  10/19/2006                               DISCHARGE SUMMARY   DISCHARGE DIAGNOSIS:  Status post laparoscopic cholecystectomy with  intraoperative cholangiogram.   HISTORY OF PRESENT ILLNESS:  The patient is a 40 year old Hispanic  female who presented for elective cholecystectomy.   HOSPITAL COURSE:  The patient underwent an uncomplicated laparoscopic  cholecystectomy.  Cholangiogram was negative.  Postoperatively, she went  to short-stay but had one vagal episode, so she was admitted overnight.  Vital signs were stable throughout the night.  She tolerated liquids.  She was discharged home on postoperative day #1 in stable condition.   DISCHARGE DIET:  Low-fat.   DISCHARGE ACTIVITY:  No lifting.   DISCHARGE MEDICATIONS:  1. Percocet 5/325 one to two p.o. q.6h. p.r.n. pain.  2. Albuterol inhaler as needed.   Follow up is with myself in 3 weeks.      Gabrielle Dare Janee Morn, M.D.  Electronically Signed     BET/MEDQ  D:  10/19/2006  T:  10/20/2006  Job:  [id number]

## 2010-08-18 NOTE — Op Note (Signed)
NAME:  Brenda Bell, Brenda Bell                      ACCOUNT NO.:  [id number]   MEDICAL RECORD NO.:  [id number]                   PATIENT TYPE:  OIB   LOCATION:  5020                                 FACILITY:  [facility]   PHYSICIAN:  Madlyn Frankel. Charlann Boxer, M.D.               DATE OF BIRTH:  Aug 25, 1970   DATE OF PROCEDURE:  08/20/2003  DATE OF DISCHARGE:  08/22/2003                                 OPERATIVE REPORT   PREOPERATIVE DIAGNOSIS:  Postoperative arthrofibrosis, status post  patellofemoral repair and patellar chondroplasty.   POSTOPERATIVE DIAGNOSES:  1. Postoperative arthrofibrosis, status post patellofemoral repair and     patellar chondroplasty.  2. Initial examination under anesthesia revealed range of motion from 0 to     70 degrees.  Postoperative following arthroscopic lysis of adhesions and     manipulation range of motion improved to 120 degrees with pressure.   OPERATION PERFORMED:  1. Examination under anesthesia.  2. Manipulation under anesthesia.  3. Arthroscopic lysis of adhesions/synovectomy.   SURGEON:  Madlyn Frankel. Charlann Boxer, M.D.   ASSISTANT:  None.   ANESTHESIA:  LMA.   COMPLICATIONS:  None apparent.   INDICATIONS FOR PROCEDURE:  Brenda Bell is a 40 year old Hispanic  female who is status post a traumatic patella dislocation. After several  months of attempted rehabilitation, she failed conservative measures.  She  had continued pain and episodes of subluxation.  For that reason, she was  set up for patellofemoral ligament repair.  She underwent this successfully;  however, postoperative rehab was complicated by normal postoperative pain  which limited her rehab.  She presents with decreased range of motion.  After discussing risks and benefits of manipulation under anesthesia and  lysis of adhesions, she consents for this procedure.   DESCRIPTION OF PROCEDURE:  The patient was brought to the operating theater.  Once adequate anesthesia and preoperative  antibiotics were administered, the  patient was positioned supine on the operating table.  Initially an  examination under anesthesia revealed the above findings where she came to  full extension; however, had flexion of only about 70 to 80 degrees.  Under  anesthesia, her hip was flexed and gentle pressure applied against the  proximal tibia and the knee flexed.  Audible lysis and breaking up of  adhesions was carried out in a controlled fashion without complication.  Following this manipulation which improved the range of motion to over 100  degrees, we proceeded with arthroscopic procedure.  The right lower  extremity was then prepped and draped in sterile fashion.  Standard  __________ were utilized.  Inferomedial portal was utilized for an  introduction of a 4.2 shaver.  Extensive lysis of adhesions and synovectomy  was carried out in the suprapatellar and medial pouches.  I did switch the  portals and use the inferolateral portals, a working portal in order to  perform the lateral gutter debridement.  No lateral release was carried out.  Following an  extensive debridement of the knee, the knee was irrigated.  Please note, too, that a patellofemoral chondroplasty was carried out to  evaluate the defect.  She has a significant defect of the medial patellar  facet which was noted previously.  The patella seemed to track nicely and  had good mobility postoperatively.  Following arthroscopic evaluation, the  portals were reapproximated using a 4-0 nylon.  The knee was injected with a  combination of 0.25% Marcaine with epinephrine.  The knee was dressed  sterilely in a bulky Jones dressing and a repeat manipulation was carried  out.  Further motion was created getting it from 0 down to about 120 degrees  with applied pressure.  At this point she was awakened from anesthesia.  She  will be transferred to the recovery room where she will be placed in CPM to  work on her improved range of  motion.                                               Madlyn Frankel Charlann Boxer, M.D.    MDO/MEDQ  D:  08/20/2003  T:  08/23/2003  Job:  [id number]

## 2010-08-18 NOTE — Op Note (Signed)
NAME:  Brenda Bell, Brenda Bell                      ACCOUNT NO.:  [id number]   MEDICAL RECORD NO.:  [id number]                   PATIENT TYPE:  AMB   LOCATION:  DAY                                  [facility]AC[facility]   PHYSICIAN:  Madlyn Frankel. Charlann Boxer, M.D.               DATE OF BIRTH:  11/22/1970   DATE OF PROCEDURE:  DATE OF DISCHARGE:                                 OPERATIVE REPORT   Audio too short to transcribe (less than 5 seconds)                                               Madlyn Frankel. Charlann Boxer, M.D.    MDO/MEDQ  D:  05/25/2003  T:  05/25/2003  Job:  [id number]

## 2010-08-18 NOTE — Op Note (Signed)
NAME:  Brenda Bell, Brenda Bell                      ACCOUNT NO.:  [id number]   MEDICAL RECORD NO.:  [id number]                   PATIENT TYPE:  AMB   LOCATION:  DAY                                  FACILITY:  [facility]   PHYSICIAN:  Madlyn Frankel. Charlann Boxer, M.D.               DATE OF BIRTH:  [birth year]   DATE OF PROCEDURE:  05/25/2003  DATE OF DISCHARGE:                                 OPERATIVE REPORT   PREOPERATIVE DIAGNOSES:  Status post right knee traumatic patella  dislocation.   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   PROCEDURE:  1. Diagnostic and operative arthroscopy with patellar chondroplasty removal     of loose body.  2. Medial parapatellar arthrotomy for further patellar chondroplasty, I&D of     the knee and medial retinacular plication and primary repair of medial     patellofemoral ligament.   SURGEON:  Madlyn Frankel. Charlann Boxer, M.D.   ASSISTANT:  Clarene Reamer, P.A.-C.   ANESTHESIA:  General.   ESTIMATED BLOOD LOSS:  Minimal.   TOURNIQUET TIME:  79 minutes at 300 mmHg.   COMPLICATIONS:  None.   DRAINS:  None.   DISPOSITION:  Extubated stable to the recovery room.   INDICATIONS FOR PROCEDURE:  Brenda Bell is a 40 year old Hispanic  female who sustained a traumatic patellar dislocation at work.  This  happened in September of 2004. She had been followed conservatively in  clinics with attempt at trying to have her rehabilitate her right knee  particularly medial structures including vastus medialis plica which was  unsuccessful. The patient had persistent symptoms of swelling, pain and  periods of instability. Based on her failure of months of attempted rehab to  strengthen and an attempt to return to work, it was decided to undergo  arthroscopic surgery and open repair of the medial structures.  After  discussing the risks and benefits, she consented for the procedure.   DESCRIPTION OF PROCEDURE:  The patient was brought to the operative theatre.  Once adequate anesthesia and preop antibiotics, 1 g of Ancef were  administered, the patient was positioned supine.  A proximal thigh  tourniquet was placed. The right lower extremity was then prepped and draped  in sterile fashion.  Initially, arthroscopy was carried out. The  inferolateral, superolateral, inferomedial portals were utilized.  Diagnostic evaluation of the knee revealed a very loose knee in the  patellofemoral joint primarily.  There did not seem to be much restraint  particularly on the medial side with regards to visualization of the knee  with pump pressured fluid. There was noted to be significant chondromalacia  and large defect noted through primarily the lateral facet of the patella.  It was an area of denuded bone which corresponded to at least two large  loose bodies identified within the knee.  Further evaluation of the knee  revealed these floating loose bodies as well as an intact lateral and medial  compartments with no evidence of chondral or meniscal damage, an intact ACL.  Inferomedial portal was utilized for diagnostic purposes as well as 3.5  shaver for attempted chondroplasty.  A portion of this lateral facet was  able to be debrided using the shaver; however, there were large fragments  that were unable to be debrided with no ream curettes available to assist in  this debridement. Based on our intention to open the knee anyway for repair  of the medial structures, the decision was made to debride this area through  an open arthrotomy.   Following arthroscopic evaluation as noted above, arthoscopic instruments  were removed, previously identified medial parapatellar incision was  identified to anatomic landmarks.  Soft dissection was  carried down to the  extensor mechanism. The medial retinacular tissues were noted to be markedly  abnormal. There was no identifiable retinacular tissue basically distal to  the VMO which included the patellofemoral ligament, medial parapatellar  femoral ligament.  Based on the appearance here and the appearance of the  patella arthroscopically, a decision was made to make a medial parapatellar  arthrotomy to allow for open debridement of the patella as well as for a  more profound plication of the medial structures.  Medial parapatellar  arthrotomy was carried out and with the patella subluxated and slightly  everted in a vertical position, a 15 blade was used to debride these large  fragments of cartilage that were identified arthroscopically. The  arthroscopic shaver was then used to further debride this area to smooth out  the surface. From the gross inspection, this represented a very large defect  in the lateral facet of the patella that measured probably 5 x 8 mm.  Following this debridement, the knee was copiously irrigated out with normal  saline and with bacitracin-laden fluid. This was done in an attempt to  irrigated any potential loose fragments or cartilage.  The grossly large  fragments were easily removed.  At this point, attention was directed to the  reconstruction of the medial side.  The medial parapatellar arthrotomy was  reapproximated using a #1 Ethibond suture.  The proximal portion of the VMO  was easily repaired near anatomic with slight plication.  After the area  distal to the VMO, a structure which was just distal to the VMO was  identified at the medial patellofemoral ligament, tissue was reapproximated  to the extensor mechanism using this #1 Ethibond.  The remainder of this  retinacular tissue distal to the VMO was then reapproximated well. Retinacular tissue was repaired laterally or medially basically extending  from the VMO tissue to the medial  patellofemoral ligament with using a #1  Vicryl. At this point, the patellofemoral joint appeared to be much more  stable than the initial arthrotomy.  Following repair of this, diagnostic  evaluation of the knee was then carried out again with the arthroscope.  There was no evidence of any loose fragments of cartilage following this  debridement.  Though not available secondary to technical difficulties,  final arthroscopic pictures were obtained of the knee joint. In comparison,  there was a significant lateral tilt and instability of the patellofemoral  joint post reconstruction of the medial side. The patella tracked within the  trochlea in a near anatomic position.  The large patellofemoral defect on  the lateral facet was  noted as well and seemed to be closely associated with  the lateral femoral condyle.  Following this repeat evaluation and  diagnostic review of the knee to assure there were no further loose bodies,  the arthroscope was removed from the knee.  The medial knee incision was  reapproximated first with 2-0 Vicryl followed by 4-0 Monocryl. The 4-0  Monocryl was used to reapproximate the inferolateral and superolateral  portals. The patient's knee was cleaned, dried and wrapped thoroughly with  Steri-Strips, dressing, sponges and bulky __________ dressing.  The patient  tolerated the procedure without complications and was transferred to the  recovery room and extubated.                                               Madlyn Frankel Charlann Boxer, M.D.    MDO/MEDQ  D:  05/25/2003  T:  05/25/2003  Job:  [id number]

## 2011-01-15 LAB — CBC
HCT: 30 — ABNORMAL LOW
MCHC: 31.5
MCV: 74 — ABNORMAL LOW
Platelets: 237
RBC: 4.05
WBC: 5.8

## 2012-12-25 ENCOUNTER — Emergency Department (INDEPENDENT_AMBULATORY_CARE_PROVIDER_SITE_OTHER)
Admission: EM | Admit: 2012-12-25 | Discharge: 2012-12-25 | Disposition: A | Discharge status: Home / Self Care | Payer: Self-pay | Source: Home / Self Care | Attending: Family Medicine | Admitting: Family Medicine

## 2012-12-25 ENCOUNTER — Encounter (HOSPITAL_COMMUNITY): Payer: Self-pay | Admitting: Emergency Medicine

## 2012-12-25 DIAGNOSIS — Z23 Encounter for immunization: Secondary | ICD-10-CM

## 2012-12-25 DIAGNOSIS — H01009 Unspecified blepharitis unspecified eye, unspecified eyelid: Secondary | ICD-10-CM

## 2012-12-25 DIAGNOSIS — L03213 Periorbital cellulitis: Secondary | ICD-10-CM

## 2012-12-25 DIAGNOSIS — H01003 Unspecified blepharitis right eye, unspecified eyelid: Secondary | ICD-10-CM

## 2012-12-25 DIAGNOSIS — H00039 Abscess of eyelid unspecified eye, unspecified eyelid: Secondary | ICD-10-CM

## 2012-12-25 MED ORDER — TETANUS-DIPHTH-ACELL PERTUSSIS 5-2.5-18.5 LF-MCG/0.5 IM SUSP
INTRAMUSCULAR | Status: AC
  Filled 2012-12-25: qty 0.5

## 2012-12-25 MED ORDER — SULFAMETHOXAZOLE-TRIMETHOPRIM 800-160 MG PO TABS: 1.0000 | ORAL_TABLET | Freq: Two times a day (BID) | ORAL | Status: DC

## 2012-12-25 MED ORDER — TETANUS-DIPHTH-ACELL PERTUSSIS 5-2.5-18.5 LF-MCG/0.5 IM SUSP
0.5000 mL | Freq: Once | INTRAMUSCULAR | Status: AC
  Administered 2012-12-25: 0.5 mL via INTRAMUSCULAR

## 2012-12-25 MED ORDER — POLYMYXIN B-TRIMETHOPRIM 10000-0.1 UNIT/ML-% OP SOLN: 1.0000 [drp] | OPHTHALMIC | Status: DC

## 2012-12-25 NOTE — ED Provider Notes (Signed)
CSN: [id number]     Arrival date & time 12/25/12  4098 History   First MD Initiated Contact with Patient 12/25/12 1031     Chief Complaint  Patient presents with  . Facial Swelling   (Consider location/radiation/quality/duration/timing/severity/associated sxs/prior Treatment) HPI Comments: 42 year old female presents complaining of right upper eyelid swelling for the past 2 days. She has had consistently worsening swelling and redness as well as pain and tenderness in the right upper eyelid. She denies any discharge, crusting of the, or blurry vision. Then no pain with extraocular movements. She does have a mild subjective fever.  She is also requesting a tetanus shot   History reviewed. No pertinent past medical history. History reviewed. No pertinent past surgical history. No family history on file. History  Substance Use Topics  . Smoking status: Never Smoker   . Smokeless tobacco: Not on file  . Alcohol Use: No   OB History   Grav Para Term Preterm Abortions TAB SAB Ect Mult Living                 Review of Systems  Constitutional: Negative for fever and chills.  Eyes: Negative for photophobia, pain, discharge, redness and visual disturbance.       Right eyelid swelling and pain  Respiratory: Negative for cough and shortness of breath.   Cardiovascular: Negative for chest pain, palpitations and leg swelling.  Gastrointestinal: Negative for nausea, vomiting and abdominal pain.  Endocrine: Negative for polydipsia and polyuria.  Genitourinary: Negative for dysuria, urgency and frequency.  Musculoskeletal: Negative for myalgias and arthralgias.  Skin: Negative for rash.  Neurological: Negative for dizziness, weakness and light-headedness.    Allergies  Review of patient's allergies indicates no known allergies.  Home Medications   Current Outpatient Rx  Name  Route  Sig  Dispense  Refill  . sulfamethoxazole-trimethoprim (SEPTRA DS) 800-160 MG per tablet   Oral   Take  1 tablet by mouth every 12 (twelve) hours.   20 tablet   0   . trimethoprim-polymyxin b (POLYTRIM) ophthalmic solution   Right Eye   Place 1 drop into the right eye every 4 (four) hours.   10 mL   0    BP 118/74  Pulse 64  Temp(Src) 98.1 F (36.7 C) (Oral)  Resp 18  SpO2 100%  LMP 12/05/2012 Physical Exam  Nursing note and vitals reviewed. Constitutional: She is oriented to person, place, and time. Vital signs are normal. She appears well-developed and well-nourished. No distress.  HENT:  Head: Normocephalic and atraumatic.  Eyes:  Significant erythema, swelling, TTP of upper right eyelid.  No pain with EOMs.  Visual acuity normal   Neck: Normal range of motion. Neck supple.  Pulmonary/Chest: Effort normal. No respiratory distress.  Lymphadenopathy:    She has no cervical adenopathy.  Neurological: She is alert and oriented to person, place, and time. She has normal strength. Coordination normal.  Skin: Skin is warm and dry. No rash noted. She is not diaphoretic.  Psychiatric: She has a normal mood and affect. Judgment normal.    ED Course  Procedures (including critical care time) Labs Review Labs Reviewed - No data to display Imaging Review No results found.  MDM   1. Blepharitis, right   2. Preseptal cellulitis of right eye    Blepharitis with possible early preseptal cellulitis. Treat with antibiotic drops and oral antibiotics, she will followup in 2 days or sooner if worsening.   Meds ordered this encounter  Medications  .  sulfamethoxazole-trimethoprim (SEPTRA DS) 800-160 MG per tablet    Sig: Take 1 tablet by mouth every 12 (twelve) hours.    Dispense:  20 tablet    Refill:  0  . trimethoprim-polymyxin b (POLYTRIM) ophthalmic solution    Sig: Place 1 drop into the right eye every 4 (four) hours.    Dispense:  10 mL    Refill:  0  . TDaP (BOOSTRIX) injection 0.5 mL    Sig:        Graylon Good, PA-C 12/25/12 1114

## 2012-12-25 NOTE — ED Notes (Signed)
Right eyelids, upper and lower lids with swelling, redness and pain.  Noticed yesterday the onset of symptoms.  No known injury.  Denies itching

## 2012-12-25 NOTE — ED Provider Notes (Signed)
Medical screening examination/treatment/procedure(s) were performed by non-physician practitioner and as supervising physician I was immediately available for consultation/collaboration.   [facility]; MD  Sharin Grave, MD 12/25/12 [phone]

## 2013-01-02 ENCOUNTER — Ambulatory Visit: Payer: Self-pay | Attending: Internal Medicine

## 2013-01-29 ENCOUNTER — Encounter: Payer: Self-pay | Admitting: Internal Medicine

## 2013-01-29 ENCOUNTER — Ambulatory Visit: Payer: Self-pay | Attending: Internal Medicine | Admitting: Internal Medicine

## 2013-01-29 VITALS — BP 113/71 | HR 68 | Temp 98.8°F | Resp 16 | Ht 64.57 in | Wt 177.0 lb

## 2013-01-29 DIAGNOSIS — Z0000 Encounter for general adult medical examination without abnormal findings: Secondary | ICD-10-CM | POA: Insufficient documentation

## 2013-01-29 LAB — BASIC METABOLIC PANEL
BUN: 8 mg/dL (ref 6–23)
Calcium: 8.9 mg/dL (ref 8.4–10.5)
Creat: 0.44 mg/dL — ABNORMAL LOW (ref 0.50–1.10)
Glucose, Bld: 83 mg/dL (ref 70–99)

## 2013-01-29 LAB — LIPID PANEL: Cholesterol: 105 mg/dL (ref 0–200)

## 2013-01-29 NOTE — Progress Notes (Unsigned)
Pt is here to establish care. Pt is requesting a physical. Pt states that she has chills at night and that during the winter she gets a rash on her face. Pt reports that she has arthritis and she has constant pain in all her joints for 3 years now.

## 2013-01-29 NOTE — Progress Notes (Unsigned)
Patient ID: Brenda [name], female   DOB: 02/19/1971, 42 y.o.   MRN: [id number] Patient Demographics  Brenda [name], is a 42 y.o. female  WUJ:[id number]  [id number]  DOB - Aug 05, 1970  Chief Complaint  Patient presents with  . Establish [name]        Subjective:   Brenda [name]. Patient is a 42 year old Hispanic female, history obtained with the interpreter has no major medical problems, here to establish [name]. Brenda [name], wanted physical and labs.  Patient has No headache, No chest pain, No abdominal pain - No Nausea, No new weakness tingling or numbness, No Cough - SOB  Objective:    Filed Vitals:   01/29/13 1654  BP: 113/71  Pulse: 68  Temp: 98.8 F (37.1 C)  TempSrc: Oral  Resp: 16  Height: 5' 4.57" (1.64 m)  Weight: 177 lb (80.287 kg)  SpO2: 100%     ALLERGIES:  No Known Allergies  PAST MEDICAL HISTORY: History reviewed. No pertinent past medical history.  PAST SURGICAL HISTORY: Past Surgical History  Procedure Laterality Date  . Cesarean section    . Tonsillectomy Bilateral   . Ovary surgery Right     benign tumor of right ovary removed, when Brenda was 42 years of age  . Breast surgery Left     cyst removed  . Knee surgery Right     tendon repair    FAMILY HISTORY: Family History  Problem Relation Age of Onset  . Hypertension Mother   . Diabetes Father     MEDICATIONS AT HOME: Prior to Admission medications   Medication Sig Start Date End Date Taking? Authorizing Provider  sulfamethoxazole-trimethoprim (SEPTRA DS) 800-160 MG per tablet Take 1 tablet by mouth every 12 (twelve) hours. 12/25/12   Graylon Good, PA-C  trimethoprim-polymyxin b (POLYTRIM) ophthalmic solution Place 1 drop into the right eye every 4 (four) hours. 12/25/12   Graylon Good, PA-C    REVIEW OF SYSTEMS:  Constitutional:   No   Fevers, chills, fatigue.  HEENT:    No headaches, Sore  throat,   Cardio-vascular: No chest pain,  Orthopnea, swelling in lower extremities, anasarca, palpitations  GI:  No abdominal pain, nausea, vomiting, diarrhea  Resp: No shortness of breath,  No coughing up of blood.No cough.No wheezing.  Skin:  no rash or lesions.  GU:  no dysuria, change in color of urine, no urgency or frequency.  No flank pain.  Musculoskeletal: No joint pain or swelling.  No decreased range of motion.  No back pain.  Psych: No change in mood or affect. No depression or anxiety.  No memory loss.   Exam  General appearance :Awake, alert, NAD, Speech Clear. HEENT: Atraumatic and Normocephalic, PERLA Neck: supple, no JVD. No cervical lymphadenopathy.  Chest: clear to auscultation bilaterally, no wheezing, rales or rhonchi CVS: S1 S2 regular, no murmurs.  Abdomen: soft, NBS, NT, ND, no gaurding, rigidity or rebound. Extremities: No cyanosis, clubbing, B/L Lower Ext shows no edema,  Neurology: Awake alert, and oriented X 3, CN II-XII intact, Non focal Skin:No Rash or lesions Wounds: N/A    Data Review   Basic Metabolic Panel: No results found for this basename: NA, K, CL, CO2, GLUCOSE, BUN, CREATININE, CALCIUM, MG, PHOS,  in the last 168 hours Liver Function Tests: No results found for this basename: AST, ALT, ALKPHOS, BILITOT, PROT, ALBUMIN,  in the last 168 hours  CBC: No results  found for this basename: WBC, NEUTROABS, HGB, HCT, MCV, PLT,  in the last 168 hours ------------------------------------------------------------------------------------------------------------------ No results found for this basename: HGBA1C,  in the last 72 hours ------------------------------------------------------------------------------------------------------------------ No results found for this basename: CHOL, HDL, LDLCALC, TRIG, CHOLHDL, LDLDIRECT,  in the last 72  hours ------------------------------------------------------------------------------------------------------------------ No results found for this basename: TSH, T4TOTAL, FREET3, T3FREE, THYROIDAB,  in the last 72 hours ------------------------------------------------------------------------------------------------------------------ No results found for this basename: VITAMINB12, FOLATE, FERRITIN, TIBC, IRON, RETICCTPCT,  in the last 72 hours  Coagulation profile  No results found for this basename: INR, PROTIME,  in the last 168 hours    Assessment & Plan   Active Problems: Health screening - Flu shot [name] - Check CBC, BMET, vitamin D level, lipid panel Ambulatory referral to OB/GYN for Pap smear - Screening mammogram ordered -Patient reports that somebody had told her in the past that Brenda had lupus, then it was checked it was normal, Brenda also wants be confirmed, ordered ANA ,Lupus anticoagulant panel     Recommendations:Followup on labs, if abnormal, will schedule appointment earlier   Follow-up in 3 months   Brenda [name] M.D. 01/29/2013, 5:07 PM

## 2013-01-30 LAB — CBC WITH DIFFERENTIAL/PLATELET
Basophils Relative: 1 % (ref 0–1)
Eosinophils Absolute: 0.1 10*3/uL (ref 0.0–0.7)
HCT: 26.3 % — ABNORMAL LOW (ref 36.0–46.0)
Hemoglobin: 7.6 g/dL — ABNORMAL LOW (ref 12.0–15.0)
MCH: 19.6 pg — ABNORMAL LOW (ref 26.0–34.0)
MCHC: 28.9 g/dL — ABNORMAL LOW (ref 30.0–36.0)
Monocytes Absolute: 0.6 10*3/uL (ref 0.1–1.0)
Monocytes Relative: 14 % — ABNORMAL HIGH (ref 3–12)

## 2013-01-30 LAB — ANA: Anti Nuclear Antibody(ANA): NEGATIVE

## 2013-02-02 LAB — LUPUS ANTICOAGULANT PANEL

## 2013-02-09 ENCOUNTER — Ambulatory Visit: Payer: Self-pay | Admitting: Internal Medicine

## 2013-03-09 ENCOUNTER — Encounter: Payer: Self-pay | Admitting: Internal Medicine

## 2013-03-09 ENCOUNTER — Ambulatory Visit: Payer: Self-pay | Attending: Internal Medicine | Admitting: Internal Medicine

## 2013-03-09 VITALS — BP 93/58 | HR 76 | Temp 98.8°F | Resp 16

## 2013-03-09 DIAGNOSIS — J069 Acute upper respiratory infection, unspecified: Secondary | ICD-10-CM

## 2013-03-09 DIAGNOSIS — D649 Anemia, unspecified: Secondary | ICD-10-CM

## 2013-03-09 LAB — BASIC METABOLIC PANEL
BUN: 12 mg/dL (ref 6–23)
CO2: 23 mEq/L (ref 19–32)
Calcium: 8.6 mg/dL (ref 8.4–10.5)
Chloride: 106 mEq/L (ref 96–112)
Creat: 0.44 mg/dL — ABNORMAL LOW (ref 0.50–1.10)
Glucose, Bld: 87 mg/dL (ref 70–99)
Potassium: 4.1 mEq/L (ref 3.5–5.3)
Sodium: 135 mEq/L (ref 135–145)

## 2013-03-09 MED ORDER — DOXYCYCLINE HYCLATE 100 MG PO TABS: 100.0000 mg | ORAL_TABLET | Freq: Two times a day (BID) | ORAL | Status: DC

## 2013-03-09 NOTE — Progress Notes (Unsigned)
Patient ID: Brenda Bell, female   DOB: 04/09/1970, 42 y.o.   MRN: [id number]   HPI: This is a 42 year old female comes in for followup and also was found to have a cough. The patient states that she's been coughing for 3 weeks now she thinks she has been having fevers but has not been checking her temperature. She has a small amount of phlegm which is blood tinged. She does not complaining of any chest pain or significant shortness of breath. She has been using over-the-counter cough medicine and has finished the entire bottle. No complaint of sinus pain postnasal drip or earache. She has a mild dry scratchy throat. In addition it is noted that she is anemic and upon reviewing her labs it does reveal that she has a history of iron deficiency anemia. Further history obtained in regards to her menstrual cycle. She does state that she has very heavy prolonged periods but has never seen an OB/GYN for this issue.  No Known Allergies History reviewed. No pertinent past medical history.  Family History  Problem Relation Age of Onset  . Hypertension Mother   . Diabetes Father    History   Social History  . Marital Status: Married    Spouse Name: N/A    Number of Children: N/A  . Years of Education: N/A   Occupational History  . Not on file.   Social History Main Topics  . Smoking status: Never Smoker   . Smokeless tobacco: Not on file  . Alcohol Use: No  . Drug Use: No  . Sexual Activity: Not on file   Other Topics Concern  . Not on file   Social History Narrative  . No narrative on file      Objective:   Filed Vitals:   03/09/13 0921  BP: 93/58  Pulse: 76  Temp: 98.8 F (37.1 C)  Resp: 16   There were no vitals filed for this visit.  Physical Exam ______ Constitutional: Appears well-developed and well-nourished. No distress. ____ HENT: Normocephalic. External right and left ear normal. Oropharynx is clear and moist. ____ Eyes: Conjunctivae and EOM are normal.  PERRLA, no scleral icterus. ____ Neck: Normal ROM. Neck supple. No JVD. No tracheal deviation. No thyromegaly. ____ CVS: RRR, S1/S2 +, no murmurs, no gallops, no carotid bruit.  Pulmonary: Constant dry cough in the office- breath sounds normal, no stridor, rhonchi, wheezes, rales.  Abdominal: Soft. BS +,  no distension, tenderness, rebound or guarding. ________ Musculoskeletal: Normal range of motion. No edema and no tenderness. ____ Neuro: Alert. Normal reflexes, muscle tone coordination. No cranial nerve deficit. Skin: Skin is warm and dry. No rash noted. Not diaphoretic. No erythema. No pallor. ____ Psychiatric: Normal mood and affect. Behavior, judgment, thought content normal. __  Lab Results  Component Value Date   WBC 4.4 01/29/2013   HGB 7.6* 01/29/2013   HCT 26.3* 01/29/2013   MCV 68.0* 01/29/2013   PLT 373 01/29/2013   Lab Results  Component Value Date   CREATININE 0.44* 01/29/2013   BUN 8 01/29/2013   NA 136 01/29/2013   K 3.9 01/29/2013   CL 106 01/29/2013   CO2 26 01/29/2013    No results found for this basename: HGBA1C   Lipid Panel     Component Value Date/Time   CHOL 105 01/29/2013 1706   TRIG 62 01/29/2013 1706   HDL 34* 01/29/2013 1706   CHOLHDL 3.1 01/29/2013 1706   VLDL 12 01/29/2013 1706   LDLCALC 59  01/29/2013 1706        Patient Active Problem List   Diagnosis Date Noted  . Periodic health assessment, general screening, adult 01/29/2013  . SKIN RASH 12/19/2009  . FURUNCLE 05/26/2009  . PHARYNGITIS, STREPTOCOCCAL 08/11/2008  . VIRAL URI 05/25/2008  . COSTOCHONDRITIS, LEFT 11/19/2007  . FATIGUE 08/12/2007  . ANEMIA, IRON DEFICIENCY 02/02/2007  . ALLERGIC RHINITIS 01/17/2007  . ASTHMA 12/30/2006  . GERD 12/30/2006  . 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 LUMP OR MASS IN BREAST 11/06/2005  . 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

## 2013-03-09 NOTE — Patient Instructions (Signed)
Continue over the counter Cough syrup. Check your temperature and take Tylenol or Ibuprofen as directed on the bottle when your have a fever > 100.1 Drink plenty of liquids and maintain a clear urine.    Infeccin de las vas areas superiores en los adultos (Upper Respiratory Infection, Adult)  La infeccin de las vas areas superiores tambin se conoce como resfro comn. La causa es un tipo de germen (virus). Los resfros se diseminan fcilmente (son contagiosos). Puede transmitirlo a los dems al besar, toser, estornudar o beber en el mismo vaso. Generalmente se cura en 1 a 2 semanas.  CUIDADOS EN EL HOGAR   Tome la medicacin segn las indicaciones.  Use un humidificador caliente o respire el vapor en una ducha caliente.  Beba gran cantidad de lquido para mantener la orina de tono claro o color amarillo plido.  Debe hacer reposo.  Regrese a su trabajo cuando la temperatura se haya normalizado, o cuando el mdico se lo indique. Puede usar un barbijo y Customer service manager las manos para evitar que el resfro se contagie. SOLICITE AYUDA DE INMEDIATO SI:   Luego de los primeros das siente que empeora en vez de [place].  Tiene dudas relacionadas con los medicamentos.  Siente escalofros, le falta el aire o escupe moco de color marrn o rojo.  Tiene una secrecin nasal de color amarillo o marrn, o siente dolor en el rostro, especialmente cuando se inclina hacia adelante.  Tiene fiebre, siente el cuello hinchado, tiene dolor al tragar u observa manchas blancas en el fondo de la garganta.  Comienza a sentir Herbalist de cabeza intenso o persistente, dolor de odos, en el seno nasal o en el pecho.  Al respirar emite un sonido similar a un silbido (sibilancias).  Siente falta de aire o escupe sangre al toser.  Tiene dolores musculares o rigidez en el cuello. ASEGRESE DE QUE:   Comprende estas instrucciones.  Controlar su enfermedad.  Solicitar ayuda de inmediato si no mejora o si  empeora. Document Released: 08/21/2010 Document Revised: 06/11/2011 [facility] Patient Information 2014 [place], [place].   Anemia inespecfica (Anemia, Nonspecific) La anemia es una enfermedad en la que la concentracin de glbulos rojos o el nivel de hemoglobina en la sangre estn por debajo de lo normal. La hemoglobina es la sustancia de los glbulos rojos que lleva el oxgeno a todo el cuerpo. La anemia da como resultado que los tejidos no reciban la cantidad suficiente de oxgeno.  CAUSAS  Las causas ms frecuentes de anemia son:   Sharlyne Pacas. El sangrado puede ser interno o externo. Incluye sangrado excesivo debido al perodo (en las mujeres) o por los intestinos.   Dficit nutricional.   Enfermedad renal, tiroidea o heptica crnicas.  Enfermedades de la mdula sea que disminuyen la produccin de glbulos rojos.  Cncer y tratamientos para Management consultant.  VIH, sida y sus tratamientos.  Trastornos del bazo que aumentan la destruccin de glbulos rojos.  Enfermedades de [employer].  Destruccin excesiva de glbulos rojos debido a una infeccin, a medicamentos y a Chartered loss adjuster. SIGNOS Y SNTOMAS   Debilidad leve.   Mareos.   Dolor de [place].  Palpitaciones.   Falta de aire, especialmente con el ejercicio.   Palidez.  Sensibilidad al fro.  Indigestin.  Nuseas.  Dificultad para dormir.  Dificultad para concentrarse. Los sntomas pueden ocurrir repentinamente o pueden Medical illustrator.  DIAGNSTICO  Con frecuencia es necesario realizar anlisis de [employer]. Estos ayudan al profesional a Futures trader. Su  mdico controlar la materia fecal para Engineer, manufacturing la presencia de [place] y buscar otras causas de prdida de [place].  TRATAMIENTO  El tratamiento vara segn la causa de la anemia. Las opciones de tratamiento son:   Suplementos de hierro, vitamina B12, o cido flico.   Medicamentos con hormonas.    Transfusin de [place]. Ser necesaria en los casos de prdida de [place] grave.   Hospitalizacin. Ser necesaria si la prdida de sangre es continua y significativa.   Cambios en la dieta.  Extirpacin del bazo. INSTRUCCIONES PARA EL CUIDADO EN EL HOGAR Cumpla con todas las visitas de control. Generalmente demora varias semanas corregir la anemia, y es muy importante que el mdico controle su enfermedad y su respuesta al [place]. SOLICITE ATENCIN MDICA DE INMEDIATO SI:   Siente debilidad extrema, falta de aire o dolor en el pecho.   Se siente mareado o tiene dificultad para concentrarse.  Tiene una hemorragia vaginal abundante.   Aparece una erupcin cutnea.   La materia fecal es negra, de aspecto alquitranado.   Se desmaya.   Vomita sangre.   Vomita repetidas veces.   Siente dolor abdominal.  Tiene fiebre o sntomas persistentes durante ms de 2 - 3 das.   Tiene fiebre y los sntomas empeoran repentinamente.   Se deshidrata.  ASEGRESE DE QUE:  Comprende estas instrucciones.  Controlar su afeccin.  Recibir ayuda de inmediato si no mejora o si empeora. Document Released: 03/19/2005 Document Revised: 11/19/2012 [facility] Patient Information 2014 [place], [place].

## 2013-03-09 NOTE — Progress Notes (Unsigned)
Pt here with c/o URI worsening with chest tightness and sob. Sx started 2 weeks ago. Hypotension C/o left abdominal pain

## 2013-03-10 LAB — CBC WITH DIFFERENTIAL/PLATELET
Eosinophils Absolute: 0.1 10*3/uL (ref 0.0–0.7)
Eosinophils Relative: 3 % (ref 0–5)
HCT: 27.7 % — ABNORMAL LOW (ref 36.0–46.0)
Hemoglobin: 8 g/dL — ABNORMAL LOW (ref 12.0–15.0)
Lymphocytes Relative: 46 % (ref 12–46)
Lymphs Abs: 1.9 10*3/uL (ref 0.7–4.0)
MCH: 19.3 pg — ABNORMAL LOW (ref 26.0–34.0)
MCV: 66.7 fL — ABNORMAL LOW (ref 78.0–100.0)
Monocytes Relative: 15 % — ABNORMAL HIGH (ref 3–12)
Neutrophils Relative %: 35 % — ABNORMAL LOW (ref 43–77)
Platelets: 271 10*3/uL (ref 150–400)
RBC: 4.15 MIL/uL (ref 3.87–5.11)
WBC: 4.2 10*3/uL (ref 4.0–10.5)

## 2013-03-10 LAB — ANEMIA PANEL
%SAT: 3 % — ABNORMAL LOW (ref 20–55)
ABS Retic: 62.3 10*3/uL (ref 19.0–186.0)
Ferritin: 2 ng/mL — ABNORMAL LOW (ref 10–291)
Iron: 15 ug/dL — ABNORMAL LOW (ref 42–145)
TIBC: 449 ug/dL (ref 250–470)
Vitamin B-12: 342 pg/mL (ref 211–911)

## 2013-03-13 ENCOUNTER — Emergency Department (INDEPENDENT_AMBULATORY_CARE_PROVIDER_SITE_OTHER)
Admission: EM | Admit: 2013-03-13 | Discharge: 2013-03-13 | Disposition: A | Discharge status: Home / Self Care | Payer: Self-pay | Source: Home / Self Care | Attending: Family Medicine | Admitting: Family Medicine

## 2013-03-13 ENCOUNTER — Emergency Department (INDEPENDENT_AMBULATORY_CARE_PROVIDER_SITE_OTHER): Payer: Self-pay

## 2013-03-13 ENCOUNTER — Encounter (HOSPITAL_COMMUNITY): Payer: Self-pay | Admitting: Emergency Medicine

## 2013-03-13 DIAGNOSIS — J069 Acute upper respiratory infection, unspecified: Secondary | ICD-10-CM

## 2013-03-13 MED ORDER — IPRATROPIUM BROMIDE 0.02 % IN SOLN
RESPIRATORY_TRACT | Status: AC
  Filled 2013-03-13: qty 2.5

## 2013-03-13 MED ORDER — ALBUTEROL SULFATE HFA 108 (90 BASE) MCG/ACT IN AERS: 1.0000 | INHALATION_SPRAY | Freq: Four times a day (QID) | RESPIRATORY_TRACT | Status: DC | PRN

## 2013-03-13 MED ORDER — METHYLPREDNISOLONE ACETATE 40 MG/ML IJ SUSP
80.0000 mg | Freq: Once | INTRAMUSCULAR | Status: AC
  Administered 2013-03-13: 80 mg via INTRAMUSCULAR

## 2013-03-13 MED ORDER — SODIUM CHLORIDE 0.9 % IN NEBU
INHALATION_SOLUTION | RESPIRATORY_TRACT | Status: AC
  Filled 2013-03-13: qty 3

## 2013-03-13 MED ORDER — METHYLPREDNISOLONE ACETATE 80 MG/ML IJ SUSP
INTRAMUSCULAR | Status: AC
  Filled 2013-03-13: qty 1

## 2013-03-13 MED ORDER — IPRATROPIUM BROMIDE 0.02 % IN SOLN
0.5000 mg | Freq: Once | RESPIRATORY_TRACT | Status: AC
  Administered 2013-03-13: 0.5 mg via RESPIRATORY_TRACT

## 2013-03-13 MED ORDER — ALBUTEROL SULFATE (5 MG/ML) 0.5% IN NEBU
INHALATION_SOLUTION | RESPIRATORY_TRACT | Status: AC
  Filled 2013-03-13: qty 1

## 2013-03-13 MED ORDER — HYDROCOD POLST-CHLORPHEN POLST 10-8 MG/5ML PO LQCR: 5.0000 mL | Freq: Two times a day (BID) | ORAL | Status: DC | PRN

## 2013-03-13 MED ORDER — ALBUTEROL SULFATE (5 MG/ML) 0.5% IN NEBU
5.0000 mg | INHALATION_SOLUTION | Freq: Once | RESPIRATORY_TRACT | Status: AC
  Administered 2013-03-13: 5 mg via RESPIRATORY_TRACT

## 2013-03-13 NOTE — ED Notes (Signed)
Seen 12-08 for cough, Rx minocycline, advised to use delsym; c/o she feels tight in her chest, esp w any exertion ; concerned she may have asthma

## 2013-03-13 NOTE — ED Provider Notes (Signed)
CSN: [id number]     Arrival date & time 03/13/13  1614 History   First MD Initiated Contact with Patient 03/13/13 1648     Chief Complaint  Patient presents with  . Cough   (Consider location/radiation/quality/duration/timing/severity/associated sxs/prior Treatment) Patient is a 42 y.o. female presenting with cough. The history is provided by the patient.  Cough Cough characteristics:  Non-productive, dry and hacking Severity:  Moderate Onset quality:  Gradual Duration:  4 days Timing:  Constant Progression:  Worsening Chronicity:  New Smoker: no   Context: upper respiratory infection   Relieved by:  Nothing Ineffective treatments:  Cough suppressants and beta-agonist inhaler Associated symptoms: rhinorrhea and shortness of breath   Associated symptoms: no fever and no rash     History reviewed. No pertinent past medical history. Past Surgical History  Procedure Laterality Date  . Cesarean section    . Tonsillectomy Bilateral   . Ovary surgery Right     benign tumor of right ovary removed, when she was 42 years of age  . Breast surgery Left     cyst removed  . Knee surgery Right     tendon repair   Family History  Problem Relation Age of Onset  . Hypertension Mother   . Diabetes Father    History  Substance Use Topics  . Smoking status: Never Smoker   . Smokeless tobacco: Not on file  . Alcohol Use: No   OB History   Grav Para Term Preterm Abortions TAB SAB Ect Mult Living                 Review of Systems  Constitutional: Negative.  Negative for fever.  HENT: Positive for postnasal drip and rhinorrhea.   Respiratory: Positive for cough and shortness of breath.   Gastrointestinal: Negative.   Skin: Negative for rash.    Allergies  Review of patient's allergies indicates no known allergies.  Home Medications   Current Outpatient Rx  Name  Route  Sig  Dispense  Refill  . albuterol (PROVENTIL HFA;VENTOLIN HFA) 108 (90 BASE) MCG/ACT inhaler  Inhalation   Inhale 1-2 puffs into the lungs every 6 (six) hours as needed for wheezing or shortness of breath.   1 Inhaler   0   . chlorpheniramine-HYDROcodone (TUSSIONEX PENNKINETIC ER) 10-8 MG/5ML LQCR   Oral   Take 5 mLs by mouth every 12 (twelve) hours as needed for cough.   115 mL   0   . doxycycline (VIBRA-TABS) 100 MG tablet   Oral   Take 1 tablet (100 mg total) by mouth 2 (two) times daily.   14 tablet   0    BP 111/56  Pulse 78  Temp(Src) 98.6 F (37 C) (Oral)  Resp 20  SpO2 100%  LMP 02/26/2013 Physical Exam  Nursing note and vitals reviewed. Constitutional: She is oriented to person, place, and time. She appears well-developed and well-nourished.  HENT:  Head: Normocephalic.  Right Ear: External ear normal.  Left Ear: External ear normal.  Mouth/Throat: Oropharynx is clear and moist.  Eyes: Pupils are equal, round, and reactive to light.  Neck: Normal range of motion. Neck supple.  Cardiovascular: Normal rate, regular rhythm, normal heart sounds and intact distal pulses.   Pulmonary/Chest: Effort normal and breath sounds normal.  Musculoskeletal: She exhibits no edema.  Lymphadenopathy:    She has no cervical adenopathy.  Neurological: She is alert and oriented to person, place, and time.  Skin: Skin is warm and dry.  ED Course  Procedures (including critical care time) Labs Review Labs Reviewed - No data to display Imaging Review Dg Chest 2 View  03/13/2013   CLINICAL DATA:  Nonproductive cough.  EXAM: CHEST  2 VIEW  COMPARISON:  10/17/2006  FINDINGS: The heart size and mediastinal contours are within normal limits. Both lungs are clear. The visualized skeletal structures are unremarkable.  IMPRESSION: No active cardiopulmonary disease.   Electronically Signed   By: Myles Rosenthal M.D.   On: 03/13/2013 18:00    EKG Interpretation    Date/Time:    Ventricular Rate:    PR Interval:    QRS Duration:   QT Interval:    QTC Calculation:   R Axis:      Text Interpretation:              MDM  X-rays reviewed and report per radiologist.     Linna Hoff, MD 03/13/13 [id number]

## 2013-04-06 ENCOUNTER — Ambulatory Visit: Payer: Self-pay

## 2013-05-01 ENCOUNTER — Ambulatory Visit: Payer: Self-pay | Attending: Internal Medicine | Admitting: Internal Medicine

## 2013-05-01 ENCOUNTER — Encounter: Payer: Self-pay | Admitting: Internal Medicine

## 2013-05-01 VITALS — BP 106/73 | HR 68 | Temp 98.0°F | Resp 16 | Wt 178.8 lb

## 2013-05-01 DIAGNOSIS — D509 Iron deficiency anemia, unspecified: Secondary | ICD-10-CM | POA: Insufficient documentation

## 2013-05-01 DIAGNOSIS — Z09 Encounter for follow-up examination after completed treatment for conditions other than malignant neoplasm: Secondary | ICD-10-CM

## 2013-05-01 DIAGNOSIS — Z139 Encounter for screening, unspecified: Secondary | ICD-10-CM

## 2013-05-01 DIAGNOSIS — N920 Excessive and frequent menstruation with regular cycle: Secondary | ICD-10-CM

## 2013-05-01 DIAGNOSIS — E559 Vitamin D deficiency, unspecified: Secondary | ICD-10-CM

## 2013-05-01 MED ORDER — VITAMIN D (ERGOCALCIFEROL) 1.25 MG (50000 UNIT) PO CAPS: 50000.0000 [IU] | ORAL_CAPSULE | ORAL | Status: DC

## 2013-05-01 MED ORDER — FERROUS SULFATE 325 (65 FE) MG PO TABS: 325.0000 mg | ORAL_TABLET | Freq: Three times a day (TID) | ORAL | Status: DC

## 2013-05-01 NOTE — Progress Notes (Signed)
MRN: [id number] Name: Brenda Bell  Sex: female Age: 43 y.o. DOB: 02/17/1971  Allergies: Review of patient's allergies indicates no known allergies.  Chief Complaint  Patient presents with  . lab results    HPI: Patient is 43 y.o. female who comes today for followup on her test results, blood work reviewed her patient has anemia panel done and no desire deficiency anemia, she reported to have heavy menstrual periods, she takes iron supplement but not regularly, also noticed vitamin D deficiency, she denies any acute symptoms had a mammogram done one year ago.  History reviewed. No pertinent past medical history.  Past Surgical History  Procedure Laterality Date  . Cesarean section    . Tonsillectomy Bilateral   . 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 Breast surgery Left     cyst removed  . Knee surgery Right     tendon repair      Medication List       This list is accurate as of: 05/01/13  3:02 PM.  Always use your most recent med list.               albuterol 108 (90 BASE) MCG/ACT inhaler  Commonly known as:  PROVENTIL HFA;VENTOLIN HFA  Inhale 1-2 puffs into the lungs every 6 (six) hours as needed for wheezing or shortness of breath.     chlorpheniramine-HYDROcodone 10-8 MG/5ML Lqcr  Commonly known as:  TUSSIONEX PENNKINETIC ER  Take 5 mLs by mouth every 12 (twelve) hours as needed for cough.     doxycycline 100 MG tablet  Commonly known as:  VIBRA-TABS  Take 1 tablet (100 mg total) by mouth 2 (two) times daily.     ferrous sulfate 325 (65 FE) MG tablet  Commonly known as:  FERROUSUL  Take 1 tablet (325 mg total) by mouth 3 (three) times daily with meals.     Vitamin D (Ergocalciferol) 50000 UNITS Caps capsule  Commonly known as:  DRISDOL  Take 1 capsule (50,000 Units total) by mouth every 7 (seven) days.        Meds ordered this encounter  Medications  . ferrous sulfate (FERROUSUL) 325 (65  FE) MG tablet    Sig: Take 1 tablet (325 mg total) by mouth 3 (three) times daily with meals.    Dispense:  90 tablet    Refill:  3  . Vitamin D, Ergocalciferol, (DRISDOL) 50000 UNITS CAPS capsule    Sig: Take 1 capsule (50,000 Units total) by mouth every 7 (seven) days.    Dispense:  12 capsule    Refill:  0    Immunization History  Administered Date(s) Administered  . Influenza Split 01/29/2013  . Influenza Whole 01/17/2007  . Pneumococcal Polysaccharide-23 01/17/2007  . Td 06/01/2006, 06/18/2006  . Tdap 12/25/2012    Family History  Problem Relation Age of Onset  . Hypertension Mother   . Diabetes Father     History  Substance Use Topics  . Smoking status: Never Smoker   . Smokeless tobacco: Not on file  . Alcohol Use: No    Review of Systems  As noted in HPI  Filed Vitals:   05/01/13 1446  BP: 106/73  Pulse: 68  Temp: 98 F (36.7 C)  Resp: 16    Physical Exam  Physical Exam  Constitutional: No distress.  Eyes: EOM are normal. Pupils are equal, round, and reactive to light.  Conjunctivae were pale  Cardiovascular: Normal rate and regular rhythm.   Pulmonary/Chest: No respiratory distress. She has no wheezes.    CBC    Component Value Date/Time   WBC 4.2 03/09/2013 0943   RBC 4.15 03/09/2013 0943   RBC 4.15 03/09/2013 0943   HGB 8.0* 03/09/2013 0943   HCT 27.7* 03/09/2013 0943   PLT 271 03/09/2013 0943   MCV 66.7* 03/09/2013 0943   LYMPHSABS 1.9 03/09/2013 0943   MONOABS 0.6 03/09/2013 0943   EOSABS 0.1 03/09/2013 0943   BASOSABS 0.0 03/09/2013 0943    CMP     Component Value Date/Time   NA 135 03/09/2013 0943   K 4.1 03/09/2013 0943   CL 106 03/09/2013 0943   CO2 23 03/09/2013 0943   GLUCOSE 87 03/09/2013 0943   BUN 12 03/09/2013 0943   CREATININE 0.44* 03/09/2013 0943   CREATININE 0.48 05/26/2009 2151   CALCIUM 8.6 03/09/2013 0943   PROT 7.4 05/26/2009 2151   ALBUMIN 4.2 05/26/2009 2151   AST 17 05/26/2009 2151   ALT 16 05/26/2009 2151   ALKPHOS 51  05/26/2009 2151   BILITOT 0.4 05/26/2009 2151   GFRNONAA >60 04/22/2009 1145   GFRAA  Value: >60        The eGFR has been calculated using the MDRD equation. This calculation has not been validated in all clinical situations. eGFR's persistently <60 mL/min signify possible Chronic Kidney Disease. 04/22/2009 1145    Lab Results  Component Value Date/Time   CHOL 105 01/29/2013  5:06 PM    No components found with this basename: hga1c    Lab Results  Component Value Date/Time   AST 17 05/26/2009  9:51 PM    Assessment and Plan  Follow up  Anemia, iron deficiency - Plan: Advised patient to be compliant and take and supplements regularly ferrous sulfate (FERROUSUL) 325 (65 FE) MG tablet  Unspecified vitamin D deficiency - Plan: Vitamin D, Ergocalciferol, (DRISDOL) 50000 UNITS CAPS capsule  Menorrhagia - Plan: Ambulatory referral to Gynecology  Screening - Plan: MM DIGITAL SCREENING BILATERAL  Health Maintenance  -Pap Smear: Referred to GYN -Mammogram: Ordered  Return in about 3 months (around 07/30/2013).  Lorayne Marek, MD

## 2013-05-01 NOTE — Progress Notes (Signed)
Patient here for follow up on her joint pain Would like lab results Still has not gone for her mammogram

## 2013-06-29 ENCOUNTER — Ambulatory Visit (INDEPENDENT_AMBULATORY_CARE_PROVIDER_SITE_OTHER): Payer: 59 | Admitting: Family Medicine

## 2013-06-29 VITALS — BP 108/68 | HR 77 | Temp 98.8°F | Ht 65.0 in | Wt 177.8 lb

## 2013-06-29 DIAGNOSIS — Z1239 Encounter for other screening for malignant neoplasm of breast: Secondary | ICD-10-CM

## 2013-06-29 DIAGNOSIS — N920 Excessive and frequent menstruation with regular cycle: Secondary | ICD-10-CM

## 2013-06-29 DIAGNOSIS — Z3202 Encounter for pregnancy test, result negative: Secondary | ICD-10-CM

## 2013-06-29 DIAGNOSIS — Z3049 Encounter for surveillance of other contraceptives: Secondary | ICD-10-CM

## 2013-06-29 LAB — CBC
HCT: 33.9 % — ABNORMAL LOW (ref 36.0–46.0)
Hemoglobin: 10.7 g/dL — ABNORMAL LOW (ref 12.0–15.0)
MCH: 25.2 pg — AB (ref 26.0–34.0)
MCHC: 31.6 g/dL (ref 30.0–36.0)
MCV: 79.8 fL (ref 78.0–100.0)
PLATELETS: 247 10*3/uL (ref 150–400)
RBC: 4.25 MIL/uL (ref 3.87–5.11)
RDW: 20.2 % — AB (ref 11.5–15.5)
WBC: 5.1 10*3/uL (ref 4.0–10.5)

## 2013-06-29 LAB — POCT PREGNANCY, URINE: Preg Test, Ur: NEGATIVE

## 2013-06-29 MED ORDER — LEVONORGESTREL 20 MCG/24HR IU IUD
INTRAUTERINE_SYSTEM | Freq: Once | INTRAUTERINE | Status: AC
  Administered 2013-06-29: 16:00:00 via INTRAUTERINE

## 2013-06-29 MED ORDER — LEVONORGESTREL 20 MCG/24HR IU IUD: 1.0000 | INTRAUTERINE_SYSTEM | Freq: Once | INTRAUTERINE | Status: DC

## 2013-06-29 NOTE — Progress Notes (Signed)
Subjective:     Patient ID: Sharon SellerMaria A Lopezflores, female   DOB: 1970/09/12, 43 y.o.   MRN: [id number]  HPI 43 y.o. F here for heavy periods. Regular 28d, 4-5d. First 2 days very heavy - 4-5 pads heavy bleeding with clots- golf balls. Pt reports 4-5 years. Pt has not tried anything for them at this time. Tried OCPs but did not tolerate side effects.   Review of Systems No SOB, cp, light headedness n/v, d/c , or other complaints at this time    Objective:   Physical Exam Filed Vitals:   06/29/13 1453  BP: 108/68  Pulse: 77  Temp: 98.8 F (37.1 C)   VSS NAD No abdominal tenderness. No organomegaly Minimal discharge. Normal cervix No c/c/e     Assessment:     Sharon SellerMaria A Lopezflores is a 43 y.o. here with ovulatory menorhagia. Discussed treatment options and agreed to trial Mirena for treatment. No indication for EMB. CBC collected today. No further eval at time for regular heavy periods.   Antony SalmonMaria A Lopezflores is a 43 y.o. year old Hispanic female who presents for placement of a Mirena IUD.   Neg Pregnancy test  The risks and benefits of the method and placement have been thouroughly reviewed with the patient and all questions were answered.  Specifically the patient is aware of failure rate of 04/998, expulsion of the IUD and of possible perforation.  The patient is aware of irregular bleeding due to the method and understands the incidence of irregular bleeding diminishes with time.  Time out was performed.  A Graves speculum was placed.  The cervix was prepped using Betadine. The cervix was then grasped with a tenaculum and the uterus was sounded to 8 cm. The IUD was inserted to 8 cm.  It was pulled back 1 cm and the IUD was disengaged.  The strings were trimmed to 3 cm.  The patient was instructed on signs and symptoms of infection and to check for the strings after each menses or each month. The patient is to refrain from intercourse for 3 days.  Pt to follow up for string check  and evaluate period in 1-642months  Tawana ScaleMichael Ryan Vikki Gains, MD [facility]

## 2013-06-29 NOTE — Patient Instructions (Signed)
Levonorgestrel intrauterine device (IUD) Qu es este medicamento? El LEVONORGESTREL (DIU) es un dispositivo anticonceptivo (control de natalidad). El dispositivo se coloca dentro del tero por un profesional de la salud. Se utiliza para evitar el embarazo y tambin se puede utilizar para tratar el sangrado abundante que ocurre durante su perodo. Dependiendo del dispositivo, se puede utilizar por 3 a 5 aos. Este medicamento puede ser utilizado para otros usos; si tiene alguna pregunta consulte con su proveedor de atencin mdica o con su farmacutico. MARCAS COMERCIALES DISPONIBLES: Mirena, Skyla Qu le debo informar a mi profesional de la salud antes de tomar este medicamento? Necesita saber si usted presenta alguno de los siguientes problemas o situaciones: -exmen de Papanicolaou anormal -cncer de mama, cuello del tero o tero -diabetes -endometritis -si tiene una infeccin plvica o genital actual o en el pasado -tiene ms de una pareja sexual o si su pareja tiene ms de una pareja -enfermedad cardiaca -antecedente de embarazo tubrico o ectpico -problemas del sistema inmunolgico -DIU colocado -enfermedad heptica o tumor del hgado -problemas con la coagulacin o si toma diluyentes sanguneos -usa medicamentos intravenoso -forma inusual del tero -sangrado vaginal que no tiene explicacin -una reaccin alrgica o inusual al levonorgestrel, a otras hormonas, a la silicona o polietilenos, a otros medicamentos, alimentos, colorantes o conservantes -si est embarazada o buscando quedar embarazada -si est amamantando a un beb Cmo debo utilizar este medicamento? Un profesional de la salud coloca este dispositivo en el tero. Hable con su pediatra para informarse acerca del uso de este medicamento en nios. Puede requerir atencin especial. Sobredosis: Pngase en contacto inmediatamente con un centro toxicolgico o una sala de urgencia si usted cree que haya tomado demasiado  medicamento. ATENCIN: Este medicamento es solo para usted. No comparta este medicamento con nadie. Qu sucede si me olvido de una dosis? No se aplica en este caso. Qu puede interactuar con este medicamento? No tome esta medicina con ninguno de los siguientes medicamentos: -amprenavir -bosentano -fosamprenavir Esta medicina tambin puede interactuar con los siguientes medicamentos: -aprepitant -barbitricos para producir el sueo o para el tratamiento de convulsiones -bexaroteno -griseofulvina -medicamentos para tratar los convulsiones, tales como carbamazepina, etotona, felbamato, oxcarbazepina, fenitona, topiramato -modafinilo -pioglitazona -rifabutina -rifampicina -rifapentina -algunos medicamentos para tratar el virus VIH, tales como atazanavir, indinavir, lopinavir, nelfinavir, tipranavir, ritonavir -hierba de [place] -warfarina Puede ser que esta lista no menciona todas las posibles interacciones. Informe a su profesional de la salud de todos los productos a base de hierbas, medicamentos de venta libre o suplementos nutritivos que est tomando. Si usted fuma, consume bebidas alcohlicas o si utiliza drogas ilegales, indqueselo tambin a su profesional de la salud. Algunas sustancias pueden interactuar con su medicamento. A qu debo estar atento al usar este medicamento? Visite a su mdico o a su profesional de la salud para chequear su evolucin peridicamente. Visite a su mdico si usted o su pareja tiene relaciones sexuales con otras personas, se vuelve VIH positivo o contrae una enfermedad de transmisin sexual. Este medicamento no la protege de la infeccin por VIH (SIDA) ni de ninguna otra enfermedad de transmisin sexual. Puede controlar la ubicacin del DIU usted misma palpando con sus dedos limpios los hilos en la parte anterior de la vagina. No tire de los hilos. Es un buen hbito controlar la ubicacin del dispositivo despus de cada perodo menstrual. Si no slo  siente los hilos sino que adems siente otra parte ms del DIU o si no puede sentir los hilos, consulte a su mdico   inmediatamente. El DIU puede salirse por s solo. Puede quedar embarazada si el dispositivo se sale de su lugar. Utilice un mtodo anticonceptivo adicional, como preservativos, y consulte a su proveedor de atencin mdica s observa que el DIU se sali de su lugar. La utilizacin de tampones no cambia la posicin del DIU y no hay inconvenientes en usarlos durante su perodo. Qu efectos secundarios puedo tener al utilizar este medicamento? Efectos secundarios que debe informar a su mdico o a su profesional de la salud tan pronto como sea posible: -reacciones alrgicas como erupcin cutnea, picazn o urticarias, hinchazn de la cara, labios o lengua -fiebre, sntomas gripales -llagas genitales -alta presin sangunea -ausencia de un perodo menstrual durante 6 semanas mientras lo utiliza -dolor, hinchazn o calor en las piernas -dolor o sensibilidad del plvico -dolor de cabeza repentino o severo -signos de embarazo -calambres estomacales -falta de aliento repentina -problemas de coordinacin, del habla, al caminar -sangrado, flujo vaginal inusual -color amarillento de los ojos o la piel Efectos secundarios que, por lo general, no requieren atencin mdica (debe informarlos a su mdico o a su profesional de la salud si persisten o si son molestos): -acn -dolor de pecho -cambios en el deseo sexual o capacidad -cambios de peso -calambres, mareos o sensacin de desmayo mientras se introduce el dispositivo -dolor de cabeza -sangrado menstruales irregulares en los primeros 3 a 6 meses de usar -nuseas Puede ser que esta lista no menciona todos los posibles efectos secundarios. Comunquese a su mdico por asesoramiento mdico sobre los efectos secundarios. Usted puede informar los efectos secundarios a la FDA por telfono al 1-800-FDA-1088. Dnde debo guardar mi medicina? No  se aplica en este caso. ATENCIN: Este folleto es un resumen. Puede ser que no cubra toda la posible informacin. Si usted tiene preguntas acerca de esta medicina, consulte con su mdico, su farmacutico o su profesional de la salud.  2014, [employer]. (2011-05-08 16:57:41)  

## 2013-06-30 LAB — WET PREP, GENITAL
Trich, Wet Prep: NONE SEEN
WBC, Wet Prep HPF POC: NONE SEEN
Yeast Wet Prep HPF POC: NONE SEEN

## 2013-07-02 ENCOUNTER — Ambulatory Visit (HOSPITAL_COMMUNITY)
Admission: RE | Admit: 2013-07-02 | Discharge: 2013-07-02 | Disposition: A | Discharge status: Home / Self Care | Payer: 59 | Source: Ambulatory Visit | Attending: Family Medicine | Admitting: Family Medicine

## 2013-07-02 DIAGNOSIS — Z3049 Encounter for surveillance of other contraceptives: Secondary | ICD-10-CM

## 2013-07-02 DIAGNOSIS — Z1239 Encounter for other screening for malignant neoplasm of breast: Secondary | ICD-10-CM

## 2013-07-02 DIAGNOSIS — N920 Excessive and frequent menstruation with regular cycle: Secondary | ICD-10-CM

## 2013-07-02 DIAGNOSIS — Z3202 Encounter for pregnancy test, result negative: Secondary | ICD-10-CM

## 2013-07-06 ENCOUNTER — Telehealth: Payer: Self-pay | Admitting: Family Medicine

## 2013-07-06 ENCOUNTER — Other Ambulatory Visit: Payer: Self-pay | Admitting: Internal Medicine

## 2013-07-06 ENCOUNTER — Telehealth: Payer: Self-pay | Admitting: General Practice

## 2013-07-06 DIAGNOSIS — R2230 Localized swelling, mass and lump, unspecified upper limb: Secondary | ICD-10-CM

## 2013-07-06 MED ORDER — METRONIDAZOLE 500 MG PO TABS: 500.0000 mg | ORAL_TABLET | Freq: Two times a day (BID) | ORAL | Status: DC

## 2013-07-06 NOTE — Telephone Encounter (Signed)
Pt with BV. Call with Flagyl 500 bid x7d

## 2013-07-06 NOTE — Telephone Encounter (Signed)
Per chart review, wet prep came back for BV. Called patient with Brenda Bell for intepreter, no answer- left message that we are trying to get in touch with you with some information, please call us back at the clinics you can also let us know if we can leave information on your voicemail or not

## 2013-07-07 NOTE — Telephone Encounter (Signed)
Called pt with Brenda Bell, spanish interpreter, and called first time pt picked up then hung up the phone after  left message that [facility] [address] and to please pick up and take as prescribed.  Pt returned call to the front desk after pt mistakenly hung up the phone.  I informed pt what is stated above.  Pt stated understanding with no further questions.

## 2013-07-09 ENCOUNTER — Telehealth: Payer: Self-pay | Admitting: General Practice

## 2013-07-09 DIAGNOSIS — N760 Acute vaginitis: Principal | ICD-10-CM

## 2013-07-09 DIAGNOSIS — B9689 Other specified bacterial agents as the cause of diseases classified elsewhere: Secondary | ICD-10-CM

## 2013-07-09 MED ORDER — METRONIDAZOLE 500 MG PO TABS: 500.0000 mg | ORAL_TABLET | Freq: Two times a day (BID) | ORAL | Status: AC

## 2013-07-09 NOTE — Telephone Encounter (Signed)
Per chart review, medication printed. Reordered medication. Called patient at home number, no answer. Called patient at husband's phone per her request, husband answered and stated she was at work. Told him to tell her that her medication is at her pharmacy now. He said we've been there twice and it isn't there. Told him that we have reordered the medication for her and if she has any other questions she can call us back. He verbalized understanding and had no further questions

## 2013-07-09 NOTE — Telephone Encounter (Signed)
Patient called and left message stating she was told [facility]. We can call her back on her husbands phone at [phone].

## 2013-07-27 ENCOUNTER — Encounter: Payer: Self-pay | Admitting: *Deleted

## 2013-07-30 ENCOUNTER — Encounter: Payer: Self-pay | Admitting: Obstetrics & Gynecology

## 2013-07-30 ENCOUNTER — Ambulatory Visit (INDEPENDENT_AMBULATORY_CARE_PROVIDER_SITE_OTHER): Payer: 59 | Admitting: Obstetrics & Gynecology

## 2013-07-30 VITALS — BP 111/74 | HR 76 | Temp 98.2°F | Ht 64.0 in | Wt 180.1 lb

## 2013-07-30 DIAGNOSIS — Z30431 Encounter for routine checking of intrauterine contraceptive device: Secondary | ICD-10-CM

## 2013-07-30 NOTE — Patient Instructions (Signed)
Levonorgestrel intrauterine device (IUD) Qu es este medicamento? El LEVONORGESTREL (DIU) es un dispositivo anticonceptivo (control de natalidad). El dispositivo se coloca dentro del tero por un profesional de la salud. Se utiliza para evitar el embarazo y tambin se puede utilizar para tratar el sangrado abundante que ocurre durante su perodo. Dependiendo del dispositivo, se puede utilizar por 3 a 5 aos. Este medicamento puede ser utilizado para otros usos; si tiene alguna pregunta consulte con su proveedor de atencin mdica o con su farmacutico. MARCAS COMERCIALES DISPONIBLES: Mirena, Skyla Qu le debo informar a mi profesional de la salud antes de tomar este medicamento? Necesita saber si usted presenta alguno de los siguientes problemas o situaciones: -exmen de Papanicolaou anormal -cncer de mama, cuello del tero o tero -diabetes -endometritis -si tiene una infeccin plvica o genital actual o en el pasado -tiene ms de una pareja sexual o si su pareja tiene ms de una pareja -enfermedad cardiaca -antecedente de embarazo tubrico o ectpico -problemas del sistema inmunolgico -DIU colocado -enfermedad heptica o tumor del hgado -problemas con la coagulacin o si toma diluyentes sanguneos -usa medicamentos intravenoso -forma inusual del tero -sangrado vaginal que no tiene explicacin -una reaccin alrgica o inusual al levonorgestrel, a otras hormonas, a la silicona o polietilenos, a otros medicamentos, alimentos, colorantes o conservantes -si est embarazada o buscando quedar embarazada -si est amamantando a un beb Cmo debo utilizar este medicamento? Un profesional de la salud coloca este dispositivo en el tero. Hable con su pediatra para informarse acerca del uso de este medicamento en nios. Puede requerir atencin especial. Sobredosis: Pngase en contacto inmediatamente con un centro toxicolgico o una sala de urgencia si usted cree que haya tomado demasiado  medicamento. ATENCIN: Este medicamento es solo para usted. No comparta este medicamento con nadie. Qu sucede si me olvido de una dosis? No se aplica en este caso. Qu puede interactuar con este medicamento? No tome esta medicina con ninguno de los siguientes medicamentos: -amprenavir -bosentano -fosamprenavir Esta medicina tambin puede interactuar con los siguientes medicamentos: -aprepitant -barbitricos para producir el sueo o para el tratamiento de convulsiones -bexaroteno -griseofulvina -medicamentos para tratar los convulsiones, tales como carbamazepina, etotona, felbamato, oxcarbazepina, fenitona, topiramato -modafinilo -pioglitazona -rifabutina -rifampicina -rifapentina -algunos medicamentos para tratar el virus VIH, tales como atazanavir, indinavir, lopinavir, nelfinavir, tipranavir, ritonavir -hierba de [place] -warfarina Puede ser que esta lista no menciona todas las posibles interacciones. Informe a su profesional de la salud de todos los productos a base de hierbas, medicamentos de venta libre o suplementos nutritivos que est tomando. Si usted fuma, consume bebidas alcohlicas o si utiliza drogas ilegales, indqueselo tambin a su profesional de la salud. Algunas sustancias pueden interactuar con su medicamento. A qu debo estar atento al usar este medicamento? Visite a su mdico o a su profesional de la salud para chequear su evolucin peridicamente. Visite a su mdico si usted o su pareja tiene relaciones sexuales con otras personas, se vuelve VIH positivo o contrae una enfermedad de transmisin sexual. Este medicamento no la protege de la infeccin por VIH (SIDA) ni de ninguna otra enfermedad de transmisin sexual. Puede controlar la ubicacin del DIU usted misma palpando con sus dedos limpios los hilos en la parte anterior de la vagina. No tire de los hilos. Es un buen hbito controlar la ubicacin del dispositivo despus de cada perodo menstrual. Si no slo  siente los hilos sino que adems siente otra parte ms del DIU o si no puede sentir los hilos, consulte a su mdico   inmediatamente. El DIU puede salirse por s solo. Puede quedar embarazada si el dispositivo se sale de su lugar. Utilice un mtodo anticonceptivo adicional, como preservativos, y consulte a su proveedor de atencin mdica s observa que el DIU se sali de su lugar. La utilizacin de tampones no cambia la posicin del DIU y no hay inconvenientes en usarlos durante su perodo. Qu efectos secundarios puedo tener al utilizar este medicamento? Efectos secundarios que debe informar a su mdico o a su profesional de la salud tan pronto como sea posible: -reacciones alrgicas como erupcin cutnea, picazn o urticarias, hinchazn de la cara, labios o lengua -fiebre, sntomas gripales -llagas genitales -alta presin sangunea -ausencia de un perodo menstrual durante 6 semanas mientras lo utiliza -dolor, hinchazn o calor en las piernas -dolor o sensibilidad del plvico -dolor de cabeza repentino o severo -signos de embarazo -calambres estomacales -falta de aliento repentina -problemas de coordinacin, del habla, al caminar -sangrado, flujo vaginal inusual -color amarillento de los ojos o la piel Efectos secundarios que, por lo general, no requieren atencin mdica (debe informarlos a su mdico o a su profesional de la salud si persisten o si son molestos): -acn -dolor de pecho -cambios en el deseo sexual o capacidad -cambios de peso -calambres, mareos o sensacin de desmayo mientras se introduce el dispositivo -dolor de cabeza -sangrado menstruales irregulares en los primeros 3 a 6 meses de usar -nuseas Puede ser que esta lista no menciona todos los posibles efectos secundarios. Comunquese a su mdico por asesoramiento mdico sobre los efectos secundarios. Usted puede informar los efectos secundarios a la FDA por telfono al 1-800-FDA-1088. Dnde debo guardar mi medicina? No  se aplica en este caso. ATENCIN: Este folleto es un resumen. Puede ser que no cubra toda la posible informacin. Si usted tiene preguntas acerca de esta medicina, consulte con su mdico, su farmacutico o su profesional de la salud.  2014, [employer]. (2011-05-08 16:57:41)  

## 2013-07-30 NOTE — Progress Notes (Signed)
   Subjective:    Patient ID: Brenda SellerMaria A Bell, female    DOB: February 15, 1971, 43 y.o.   MRN: [id number]  [id number] No LMP recorded. Continues bleeding after IUD (Mirena) inserted 1 mo ago. No pain.    Review of Systems  Constitutional: Negative.   Genitourinary: Positive for vaginal bleeding and menstrual problem. Negative for vaginal discharge and pelvic pain.       Objective:   Physical Exam  Constitutional: She appears well-developed. No distress.  Abdominal: Soft. There is no tenderness.  Genitourinary: Uterus normal.  String 3 cm, minimal tenderness no mass  Skin: Skin is warm and dry.  Psychiatric: She has a normal mood and affect. Her behavior is normal.          Assessment & Plan:  DUB with Mirena RTC if not improved  Adam PhenixJames G Shaila Gilchrest, MD 07/30/2013

## 2013-08-04 ENCOUNTER — Ambulatory Visit: Payer: Self-pay | Admitting: Internal Medicine

## 2013-08-19 ENCOUNTER — Encounter: Payer: Self-pay | Admitting: Internal Medicine

## 2013-08-19 ENCOUNTER — Ambulatory Visit: Payer: 59 | Attending: Internal Medicine | Admitting: Internal Medicine

## 2013-08-19 VITALS — BP 110/73 | HR 78 | Temp 98.7°F | Resp 16 | Wt 184.0 lb

## 2013-08-19 DIAGNOSIS — D509 Iron deficiency anemia, unspecified: Secondary | ICD-10-CM | POA: Insufficient documentation

## 2013-08-19 DIAGNOSIS — Z139 Encounter for screening, unspecified: Secondary | ICD-10-CM

## 2013-08-19 DIAGNOSIS — Z79899 Other long term (current) drug therapy: Secondary | ICD-10-CM | POA: Insufficient documentation

## 2013-08-19 LAB — CBC WITH DIFFERENTIAL/PLATELET
BASOS ABS: 0 10*3/uL (ref 0.0–0.1)
Basophils Relative: 0 % (ref 0–1)
EOS ABS: 0.2 10*3/uL (ref 0.0–0.7)
Eosinophils Relative: 5 % (ref 0–5)
HEMATOCRIT: 30.1 % — AB (ref 36.0–46.0)
HEMOGLOBIN: 9.7 g/dL — AB (ref 12.0–15.0)
Lymphocytes Relative: 37 % (ref 12–46)
Lymphs Abs: 1.8 10*3/uL (ref 0.7–4.0)
MCH: 25 pg — AB (ref 26.0–34.0)
MCHC: 32.2 g/dL (ref 30.0–36.0)
MCV: 77.6 fL — ABNORMAL LOW (ref 78.0–100.0)
MONO ABS: 0.7 10*3/uL (ref 0.1–1.0)
MONOS PCT: 15 % — AB (ref 3–12)
NEUTROS ABS: 2.1 10*3/uL (ref 1.7–7.7)
Neutrophils Relative %: 43 % (ref 43–77)
Platelets: 265 10*3/uL (ref 150–400)
RBC: 3.88 MIL/uL (ref 3.87–5.11)
RDW: 16.8 % — AB (ref 11.5–15.5)
WBC: 4.8 10*3/uL (ref 4.0–10.5)

## 2013-08-19 MED ORDER — FERROUS SULFATE 325 (65 FE) MG PO TABS: 325.0000 mg | ORAL_TABLET | Freq: Three times a day (TID) | ORAL | Status: DC

## 2013-08-19 NOTE — Progress Notes (Signed)
MRN: [id number] Name: Brenda Bell  Sex: female Age: 43 y.o. DOB: [birth year]  Allergies: Review of patient's allergies indicates no known allergies.  Chief Complaint  Patient presents with  . Follow-up    HPI: Patient is 43 y.o. female who has to of iron deficiency anemia comes today for followup, she denies any acute symptoms has been taking her iron pills 3 times a day denies any nausea vomiting change in bowel habits. Patient has not had recent mammogram done, she follows up with the gynecologist and had IUD placed recently.  History reviewed. No pertinent past medical history.  Past Surgical History  Procedure Laterality Date  . Cesarean section    . Tonsillectomy Bilateral   . 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 Breast surgery Left     cyst removed  . Knee surgery Right     tendon repair      Medication List       This list is accurate as of: 08/19/13  5:23 PM.  Always use your most recent med list.               albuterol 108 (90 BASE) MCG/ACT inhaler  Commonly known as:  PROVENTIL HFA;VENTOLIN HFA  Inhale 1-2 puffs into the lungs every 6 (six) hours as needed for wheezing or shortness of breath.     chlorpheniramine-HYDROcodone 10-8 MG/5ML Lqcr  Commonly known as:  TUSSIONEX PENNKINETIC ER  Take 5 mLs by mouth every 12 (twelve) hours as needed for cough.     ferrous sulfate 325 (65 FE) MG tablet  Commonly known as:  FERROUSUL  Take 1 tablet (325 mg total) by mouth 3 (three) times daily with meals.     levonorgestrel 20 MCG/24HR IUD  Commonly known as:  MIRENA  1 Intra Uterine Device (1 each total) by Intrauterine route once.     Vitamin D (Ergocalciferol) 50000 UNITS Caps capsule  Commonly known as:  DRISDOL  Take 1 capsule (50,000 Units total) by mouth every 7 (seven) days.        Meds ordered this encounter  Medications  . ferrous sulfate (FERROUSUL) 325 (65 FE) MG tablet   Sig: Take 1 tablet (325 mg total) by mouth 3 (three) times daily with meals.    Dispense:  90 tablet    Refill:  3    Immunization History  Administered Date(s) Administered  . Influenza Split 01/29/2013  . Influenza Whole 01/17/2007  . Pneumococcal Polysaccharide-23 01/17/2007  . Td 06/01/2006, 06/18/2006  . Tdap 12/25/2012    Family History  Problem Relation Age of Onset  . Hypertension Mother   . Diabetes Father     History  Substance Use Topics  . Smoking status: Never Smoker   . Smokeless tobacco: Never Used  . Alcohol Use: No    Review of Systems   As noted in HPI  Filed Vitals:   08/19/13 1702  BP: 110/73  Pulse: 78  Temp: 98.7 F (37.1 C)  Resp: 16    Physical Exam  Physical Exam  Constitutional: No distress.  Eyes: EOM are normal. Pupils are equal, round, and reactive to light.  Cardiovascular: Normal rate and regular rhythm.   Pulmonary/Chest: Breath sounds normal. No respiratory distress. She has no wheezes. She has no rales.  Musculoskeletal: She exhibits no edema.    CBC    Component Value Date/Time   WBC 5.1  06/29/2013 1532   RBC 4.25 06/29/2013 1532   RBC 4.15 03/09/2013 0943   HGB 10.7* 06/29/2013 1532   HCT 33.9* 06/29/2013 1532   PLT 247 06/29/2013 1532   MCV 79.8 06/29/2013 1532   LYMPHSABS 1.9 03/09/2013 0943   MONOABS 0.6 03/09/2013 0943   EOSABS 0.1 03/09/2013 0943   BASOSABS 0.0 03/09/2013 0943    CMP     Component Value Date/Time   NA 135 03/09/2013 0943   K 4.1 03/09/2013 0943   CL 106 03/09/2013 0943   CO2 23 03/09/2013 0943   GLUCOSE 87 03/09/2013 0943   BUN 12 03/09/2013 0943   CREATININE 0.44* 03/09/2013 0943   CREATININE 0.48 05/26/2009 2151   CALCIUM 8.6 03/09/2013 0943   PROT 7.4 05/26/2009 2151   ALBUMIN 4.2 05/26/2009 2151   AST 17 05/26/2009 2151   ALT 16 05/26/2009 2151   ALKPHOS 51 05/26/2009 2151   BILITOT 0.4 05/26/2009 2151   GFRNONAA >60 04/22/2009 1145   GFRAA  Value: >60        The eGFR has been calculated using the  MDRD equation. This calculation has not been validated in all clinical situations. eGFR's persistently <60 mL/min signify possible Chronic Kidney Disease. 04/22/2009 1145    Lab Results  Component Value Date/Time   CHOL 105 01/29/2013  5:06 PM    No components found with this basename: hga1c    Lab Results  Component Value Date/Time   AST 17 05/26/2009  9:51 PM    Assessment and Plan  Anemia, iron deficiency - Plan: Will repeat CBC with Differential, continue with her ferrous sulfate (FERROUSUL) 325 (65 FE) MG tablet  Screening - Plan: MM DIGITAL SCREENING BILATERAL   Health Maintenance  -Mammogram: ordered    Return in about 3 months (around 11/19/2013) for anemia.  Lorayne Marek, MD

## 2013-08-19 NOTE — Progress Notes (Signed)
Patient here for follow up Would like her lab results Recently seen by ob-gyn and had an IUD inserted

## 2013-08-20 ENCOUNTER — Telehealth: Payer: Self-pay

## 2013-08-20 DIAGNOSIS — D509 Iron deficiency anemia, unspecified: Secondary | ICD-10-CM

## 2013-08-20 HISTORY — DX: Iron deficiency anemia, unspecified: D50.9

## 2013-08-20 NOTE — Telephone Encounter (Signed)
Interpreter line used Patient not available Message left on voice mail to return our call 

## 2013-08-20 NOTE — Telephone Encounter (Signed)
Message copied by Lestine MountJUAREZ, Vickye Astorino L on Thu Aug 20, 2013 12:35 PM ------      Message from: Doris CheadleADVANI, DEEPAK      Created: Thu Aug 20, 2013  9:23 AM       Blood work reviewed her hemoglobin is dropped as compared to last month, advise patient take iron supplement 3 times a day, will repeat CBC on the next visit. ------

## 2013-11-20 ENCOUNTER — Ambulatory Visit: Payer: 59 | Admitting: Internal Medicine

## 2013-12-24 ENCOUNTER — Ambulatory Visit: Payer: 59 | Admitting: Obstetrics & Gynecology

## 2014-02-01 ENCOUNTER — Encounter: Payer: Self-pay | Admitting: Internal Medicine

## 2014-03-20 ENCOUNTER — Ambulatory Visit (INDEPENDENT_AMBULATORY_CARE_PROVIDER_SITE_OTHER): Payer: BC Managed Care – PPO | Admitting: Family Medicine

## 2014-03-20 VITALS — BP 98/80 | HR 99 | Temp 98.4°F | Resp 17 | Ht 65.0 in | Wt 187.0 lb

## 2014-03-20 DIAGNOSIS — R059 Cough, unspecified: Secondary | ICD-10-CM

## 2014-03-20 DIAGNOSIS — R05 Cough: Secondary | ICD-10-CM

## 2014-03-20 MED ORDER — AMOXICILLIN 875 MG PO TABS
875.0000 mg | ORAL_TABLET | Freq: Two times a day (BID) | ORAL | Status: DC
Start: 2014-03-20 — End: 2015-01-20

## 2014-03-20 MED ORDER — HYDROCODONE-HOMATROPINE 5-1.5 MG/5ML PO SYRP: 5.0000 mL | ORAL_SOLUTION | Freq: Three times a day (TID) | ORAL | Status: DC | PRN

## 2014-03-20 NOTE — Progress Notes (Signed)
      Patient ID: Brenda Bell MRN: [id number], DOB: [birth year], 43 y.o. Date of Encounter: 03/20/2014, 4:30 PM  Primary Physician: Doris CheadleADVANI, DEEPAK, MD  Chief Complaint:  Chief Complaint  Patient presents with  . Sore Throat  . Headache    HPI: 43 y.o. year old female presents with 2 day history of sore throat. Subjective fever and chills. No cough, congestion, rhinorrhea, sinus pressure, otalgia, or headache. Normal hearing. No GI complaints. Able to swallow saliva, but hurts to do so. Decreased appetite secondary to sore throat.   Packing medical products.  No past medical history on file.   Home Meds: Prior to Admission medications   Medication Sig Start Date End Date Taking? Authorizing Provider  albuterol (PROVENTIL HFA;VENTOLIN HFA) 108 (90 BASE) MCG/ACT inhaler Inhale 1-2 puffs into the lungs every 6 (six) hours as needed for wheezing or shortness of breath. 03/13/13  Yes Linna HoffJames D Kindl, MD  levonorgestrel (MIRENA) 20 MCG/24HR IUD 1 Intra Uterine Device (1 each total) by Intrauterine route once. 06/29/13  Yes Minta BalsamMichael R Odom, MD    Allergies: No Known Allergies  History   Social History  . Marital Status: Married    Spouse Name: N/A    Number of Children: N/A  . Years of Education: N/A   Occupational History  . Not on file.   Social History Main Topics  . Smoking status: Never Smoker   . Smokeless tobacco: Never Used  . Alcohol Use: No  . Drug Use: No  . Sexual Activity: Yes    Birth Control/ Protection: IUD   Other Topics Concern  . Not on file   Social History Narrative     Review of Systems: Constitutional: negative for chills, fever, night sweats or weight changes HEENT: see above Cardiovascular: negative for chest pain or palpitations Respiratory: negative for hemoptysis, wheezing, or shortness of breath Abdominal: negative for abdominal pain, nausea, vomiting or diarrhea Dermatological: negative for rash Neurologic: negative for  headache   Physical Exam: Blood pressure 98/80, pulse 99, temperature 98.4 F (36.9 C), temperature source Oral, resp. rate 17, height 5\' 5"  (1.651 m), weight 187 lb (84.823 kg), last menstrual period 03/02/2014, SpO2 98 %., Body mass index is 31.12 kg/(m^2). General: Well developed, well nourished, in no acute distress. Head: Normocephalic, atraumatic, eyes without discharge, sclera non-icteric, nares are patent. Bilateral auditory canals clear, TM's are without perforation, pearly grey with reflective cone of light bilaterally. No sinus TTP. Oral cavity moist, dentition normal. Posterior pharynx with post nasal drip and mild erythema. No peritonsillar abscess or tonsillar exudate. Neck: Supple. No thyromegaly. Full ROM. No lymphadenopathy. Lungs: Clear bilaterally to auscultation without wheezes, rales, or rhonchi. Breathing is unlabored. Heart: RRR with S1 S2. No murmurs, rubs, or gallops appreciated. Abdomen: Soft, non-tender, non-distended with normoactive bowel sounds. No hepatomegaly. No rebound/guarding. No obvious abdominal masses. Msk:  Strength and tone normal for age. Extremities: No clubbing or cyanosis. No edema. Neuro: Alert and oriented X 3. Moves all extremities spontaneously. CNII-XII grossly in tact. Psych:  Responds to questions appropriately with a normal affect.     ASSESSMENT AND PLAN:  43 y.o. year old female with acute pharyngitis and bronchitis Cough - Plan: HYDROcodone-homatropine (HYCODAN) 5-1.5 MG/5ML syrup, amoxicillin (AMOXIL) 875 MG tablet   - -Tylenol/Motrin prn -Rest/fluids -RTC precautions -RTC 3-5 days if no improvement  Signed, Elvina SidleKurt Alexxa Sabet, MD 03/20/2014 4:30 PM

## 2014-03-20 NOTE — Patient Instructions (Signed)

## 2014-11-22 ENCOUNTER — Ambulatory Visit (INDEPENDENT_AMBULATORY_CARE_PROVIDER_SITE_OTHER): Payer: 59 | Admitting: Internal Medicine

## 2014-11-22 VITALS — BP 112/76 | HR 73 | Temp 99.1°F | Resp 16 | Ht 65.5 in | Wt 184.6 lb

## 2014-11-22 DIAGNOSIS — R05 Cough: Secondary | ICD-10-CM | POA: Diagnosis not present

## 2014-11-22 DIAGNOSIS — J988 Other specified respiratory disorders: Secondary | ICD-10-CM

## 2014-11-22 DIAGNOSIS — R059 Cough, unspecified: Secondary | ICD-10-CM

## 2014-11-22 DIAGNOSIS — J22 Unspecified acute lower respiratory infection: Secondary | ICD-10-CM

## 2014-11-22 DIAGNOSIS — J4520 Mild intermittent asthma, uncomplicated: Secondary | ICD-10-CM

## 2014-11-22 MED ORDER — PREDNISONE 20 MG PO TABS: ORAL_TABLET | ORAL | Status: DC

## 2014-11-22 MED ORDER — AZITHROMYCIN 250 MG PO TABS: ORAL_TABLET | ORAL | Status: DC

## 2014-11-22 MED ORDER — ALBUTEROL SULFATE HFA 108 (90 BASE) MCG/ACT IN AERS: 1.0000 | INHALATION_SPRAY | Freq: Four times a day (QID) | RESPIRATORY_TRACT | Status: DC | PRN

## 2014-11-22 MED ORDER — HYDROCODONE-HOMATROPINE 5-1.5 MG/5ML PO SYRP: 5.0000 mL | ORAL_SOLUTION | Freq: Three times a day (TID) | ORAL | Status: DC | PRN

## 2014-11-22 NOTE — Progress Notes (Signed)
   Subjective:    Patient ID: Brenda Bell, female    DOB: 1970-12-19, 44 y.o.   MRN: [id number]  HPI  Chief Complaint  Patient presents with  . chest congestion    x thursday  . Fatigue    x thursday  . Cough    x thursday  . Wheezing    x thursday   Productive cough of breath with activity History of reactive airway disease requiring an inhaler in the past with infections No rhinorrhea or sore throat since this illness Low-grade fever but no night sweats  See history and chart of iron deficiency anemia No longer [facility]    Review of Systems    noncontributory Objective:   Physical Exam BP 112/76 mmHg  Pulse 73  Temp(Src) 99.1 F (37.3 C) (Oral)  Resp 16  Ht 5' 5.5" (1.664 m)  Wt 184 lb 9.6 oz (83.734 kg)  BMI 30.24 kg/m2  SpO2 99%  LMP 10/25/2014 Coughing throughout the exam TMs clear Conjunctiva slightly injected and watery Nares clear Throat clear Chest with wheezing on forced expiration mildly Rhonchi that clear with coughing Heart regular without murmur       Assessment & Plan:  Lower respiratory infection with reactive airway disease Meds ordered this encounter  Medications  . predniSONE (DELTASONE) 20 MG tablet    Sig: 3/3/2/2/1/1 single daily dose for 6 days    Dispense:  12 tablet    Refill:  0  . azithromycin (ZITHROMAX) 250 MG tablet    Sig: As packaged    Dispense:  6 tablet    Refill:  0  . HYDROcodone-homatropine (HYCODAN) 5-1.5 MG/5ML syrup    Sig: Take 5 mLs by mouth every 8 (eight) hours as needed for cough.    Dispense:  120 mL    Refill:  0  . albuterol (PROVENTIL HFA;VENTOLIN HFA) 108 (90 BASE) MCG/ACT inhaler    Sig: Inhale 1-2 puffs into the lungs every 6 (six) hours as needed for wheezing or shortness of breath.    Dispense:  1 Inhaler    Refill:  1   Set up follow-up exam Spanish-speaking provider

## 2014-11-22 NOTE — Patient Instructions (Signed)
Appointment for Full physical with spanish speaking provider in next 2 months

## 2015-01-20 ENCOUNTER — Encounter: Payer: 59 | Admitting: Urgent Care

## 2015-01-20 ENCOUNTER — Ambulatory Visit (INDEPENDENT_AMBULATORY_CARE_PROVIDER_SITE_OTHER): Payer: 59 | Admitting: Urgent Care

## 2015-01-20 ENCOUNTER — Encounter: Payer: Self-pay | Admitting: Urgent Care

## 2015-01-20 ENCOUNTER — Other Ambulatory Visit: Payer: Self-pay | Admitting: Urgent Care

## 2015-01-20 VITALS — BP 105/72 | HR 65 | Temp 98.3°F | Resp 16 | Ht 65.0 in | Wt 178.6 lb

## 2015-01-20 DIAGNOSIS — R05 Cough: Secondary | ICD-10-CM | POA: Diagnosis not present

## 2015-01-20 DIAGNOSIS — N898 Other specified noninflammatory disorders of vagina: Secondary | ICD-10-CM

## 2015-01-20 DIAGNOSIS — M545 Low back pain: Secondary | ICD-10-CM

## 2015-01-20 DIAGNOSIS — K219 Gastro-esophageal reflux disease without esophagitis: Secondary | ICD-10-CM | POA: Diagnosis not present

## 2015-01-20 DIAGNOSIS — Z23 Encounter for immunization: Secondary | ICD-10-CM | POA: Diagnosis not present

## 2015-01-20 DIAGNOSIS — Z833 Family history of diabetes mellitus: Secondary | ICD-10-CM | POA: Diagnosis not present

## 2015-01-20 DIAGNOSIS — Z0000 Encounter for general adult medical examination without abnormal findings: Secondary | ICD-10-CM

## 2015-01-20 DIAGNOSIS — Z8639 Personal history of other endocrine, nutritional and metabolic disease: Secondary | ICD-10-CM

## 2015-01-20 DIAGNOSIS — Z9889 Other specified postprocedural states: Secondary | ICD-10-CM

## 2015-01-20 DIAGNOSIS — M25462 Effusion, left knee: Secondary | ICD-10-CM | POA: Diagnosis not present

## 2015-01-20 DIAGNOSIS — Z8739 Personal history of other diseases of the musculoskeletal system and connective tissue: Secondary | ICD-10-CM | POA: Diagnosis not present

## 2015-01-20 DIAGNOSIS — Z862 Personal history of diseases of the blood and blood-forming organs and certain disorders involving the immune mechanism: Secondary | ICD-10-CM

## 2015-01-20 DIAGNOSIS — R059 Cough, unspecified: Secondary | ICD-10-CM

## 2015-01-20 LAB — LIPID PANEL
Cholesterol: 95 mg/dL — ABNORMAL LOW (ref 125–200)
HDL: 23 mg/dL — AB (ref >=46)
LDL CALC: 54 mg/dL (ref <130)
TRIGLYCERIDES: 90 mg/dL (ref <150)
Total CHOL/HDL Ratio: 4.1 Ratio (ref <=5.0)
VLDL: 18 mg/dL (ref <30)

## 2015-01-20 LAB — POCT WET + KOH PREP
TRICH BY WET PREP: ABSENT
YEAST BY KOH: ABSENT
YEAST BY WET PREP: ABSENT

## 2015-01-20 LAB — CBC
HCT: 33.5 % — ABNORMAL LOW (ref 36.0–46.0)
Hemoglobin: 10.3 g/dL — ABNORMAL LOW (ref 12.0–15.0)
MCH: 24.9 pg — AB (ref 26.0–34.0)
MCHC: 30.7 g/dL (ref 30.0–36.0)
MCV: 81.1 fL (ref 78.0–100.0)
MPV: 11.2 fL (ref 8.6–12.4)
PLATELETS: 252 10*3/uL (ref 150–400)
RBC: 4.13 MIL/uL (ref 3.87–5.11)
RDW: 17.1 % — ABNORMAL HIGH (ref 11.5–15.5)
WBC: 2.4 10*3/uL — ABNORMAL LOW (ref 4.0–10.5)

## 2015-01-20 LAB — COMPREHENSIVE METABOLIC PANEL
ALBUMIN: 3.4 g/dL — AB (ref 3.6–5.1)
ALT: 13 U/L (ref 6–29)
AST: 13 U/L (ref 10–30)
Alkaline Phosphatase: 33 U/L (ref 33–115)
BUN: 8 mg/dL (ref 7–25)
CHLORIDE: 107 mmol/L (ref 98–110)
CO2: 25 mmol/L (ref 20–31)
Calcium: 8.3 mg/dL — ABNORMAL LOW (ref 8.6–10.2)
Creat: 0.44 mg/dL — ABNORMAL LOW (ref 0.50–1.10)
GLUCOSE: 86 mg/dL (ref 65–99)
Potassium: 4 mmol/L (ref 3.5–5.3)
SODIUM: 138 mmol/L (ref 135–146)
Total Bilirubin: 0.4 mg/dL (ref 0.2–1.2)
Total Protein: 6.4 g/dL (ref 6.1–8.1)

## 2015-01-20 LAB — POCT GLYCOSYLATED HEMOGLOBIN (HGB A1C): Hemoglobin A1C: 5.1

## 2015-01-20 LAB — HEMOGLOBIN A1C: Hgb A1c MFr Bld: 5.1 % (ref 4.0–6.0)

## 2015-01-20 MED ORDER — MELOXICAM 15 MG PO TABS: 7.5000 mg | ORAL_TABLET | Freq: Every day | ORAL | Status: DC

## 2015-01-20 MED ORDER — CYCLOBENZAPRINE HCL 10 MG PO TABS: 5.0000 mg | ORAL_TABLET | Freq: Every day | ORAL | Status: DC

## 2015-01-20 MED ORDER — ESOMEPRAZOLE MAGNESIUM 20 MG PO CPDR: 20.0000 mg | DELAYED_RELEASE_CAPSULE | Freq: Every day | ORAL | Status: DC

## 2015-01-20 NOTE — Progress Notes (Signed)
MRN: [id number]  Subjective:   Brenda Bell is a 44 y.o. female presenting for annual physical exam and joint pain.  Medical care team includes: PCP: Brenda Bell, DEEPAK, MD Specialists: None.  Patient is married, has 3 adult children, has good relationships with her family. She is [place]. Patient admits an unhealthy diet, hardly any fiber. Does not exercise. Patient works in [employer], packing medicines. She stands while at work. Does not exercise.  Joint pain - has several year history of arthritis, joint pain. Has been seen by ortho, surgical repair of patellar tendon (right). Was also told she needed surgery of her left knee. She did not want to pursue this at that time. Work and exercise as above. Patient has intermittent back and knee pain. She is not able to sit while at work and thinks this is a major contributing factor. She denies trauma, erythema, warmth of her knee. No history of back surgeries. She would like a medication for pain and inflammation to take occasionally.  Cough - reports a several month history of cough associated with food. Diet as above. ROS as below.  Brenda Bell has a current medication list which includes the following prescription(s): albuterol, amoxicillin, azithromycin, hydrocodone-homatropine, levonorgestrel, and prednisone. She has No Known Allergies.  Brenda Bell  has no past medical history on file. Also  has past surgical history that includes Cesarean section; Tonsillectomy (Bilateral); Ovary surgery (Right); Breast surgery (Left); and Knee surgery (Right).  Brenda Bell family history includes Diabetes in her father; Hypertension in her mother.  Immunizations: Flu shot today, TDAP 2014  Review of Systems  Constitutional: Negative for fever, chills, weight loss, malaise/fatigue and diaphoresis.  HENT: Negative for congestion, ear discharge, ear pain, hearing loss, nosebleeds, sore throat and tinnitus.   Eyes: Negative for blurred  vision, double vision, photophobia, pain, discharge and redness.  Respiratory: Negative for cough, shortness of breath and wheezing.   Cardiovascular: Negative for chest pain, palpitations and leg swelling.  Gastrointestinal: Negative for nausea, vomiting, abdominal pain, diarrhea, constipation and blood in stool.  Genitourinary: Negative for dysuria, urgency, frequency, hematuria and flank pain.  Musculoskeletal: Positive for back pain and joint pain (knees bilaterally). Negative for myalgias.  Skin: Negative for itching and rash.  Neurological: Negative for dizziness, tingling, seizures, loss of consciousness, weakness and headaches.  Endo/Heme/Allergies: Negative for polydipsia.  Psychiatric/Behavioral: Negative for depression, suicidal ideas, hallucinations, memory loss and substance abuse. The patient is not nervous/anxious and does not have insomnia.     Objective:   Vitals: BP 105/72 mmHg  Pulse 65  Temp(Src) 98.3 F (36.8 C) (Oral)  Resp 16  Ht 5\' 5"  (1.651 m)  Wt 178 lb 9.6 oz (81.012 kg)  BMI 29.72 kg/m2  LMP 12/23/2014  Physical Exam  Constitutional: She is oriented to person, place, and time. She appears well-developed and well-nourished.  HENT:  TM's intact bilaterally, no effusions or erythema. Nares patent, nasal turbinates pink and moist, nasal passages patent. No sinus tenderness. Oropharynx clear, mucous membranes moist, dentition in good repair.  Eyes: Conjunctivae and EOM are normal. Pupils are equal, round, and reactive to light. Right eye exhibits no discharge. Left eye exhibits no discharge. No scleral icterus.  Neck: Normal range of motion. Neck supple. No thyromegaly present.  Cardiovascular: Normal rate, regular rhythm and intact distal pulses.  Exam reveals no gallop and no friction rub.   No murmur heard. Pulmonary/Chest: No respiratory distress. She has no wheezes. She has no rales.  Abdominal: Soft.  Bowel sounds are normal. She exhibits no distension  and no mass. There is no tenderness.  Musculoskeletal: Normal range of motion. She exhibits no edema or tenderness.  Lymphadenopathy:    She has no cervical adenopathy.  Neurological: She is alert and oriented to person, place, and time. She has normal reflexes.  Skin: Skin is warm and dry. No rash noted. No erythema. No pallor.  Psychiatric: She has a normal mood and affect.   Results for orders placed or performed in visit on 01/20/15 (from the past 24 hour(s))  POCT Wet + KOH Prep     Status: Abnormal   Collection Time: 01/20/15  2:33 PM  Result Value Ref Range   Yeast by KOH Absent Present, Absent   Yeast by wet prep Absent Present, Absent   WBC by wet prep None None, Few   Clue Cells Wet Prep HPF POC None None   Trich by wet prep Absent Present, Absent   Bacteria Wet Prep HPF POC Few None, Few   Epithelial Cells By [employer] ([facility]) Moderate (A) None, Few   RBC,UR,HPF,POC None None RBC/hpf  POCT glycosylated hemoglobin (Hb A1C)     Status: None   Collection Time: 01/20/15  2:54 PM  Result Value Ref Range   Hemoglobin A1C 5.1    Assessment and Plan :   1. Annual physical exam - Labs pending - Patient is medically healthy - Discussed healthy lifestyle, diet, exercise, preventative care, vaccinations, and addressed patient's concerns.   2. Vaginal discharge - Labs and physical exam findings reassuring. Patient was worried she had gotten an infection from spending time in a [place]. Continue to monitor.  3. Knee swelling, left 4. History of right knee surgery 5. History of arthritis 6. Low back pain without sciatica, unspecified back pain laterality -  Declined x-rays and referral to ortho. She states that she knows she needs surgery but would like to push this back as much as possible. For now, she would like to manage conservatively with NSAID and Flexeril.  7. History of anemia - Labs pending  8. History of vitamin D deficiency - Vitamin D 1,25 dihydroxy pending  9.  Gastroesophageal reflux disease, esophagitis presence not specified 10. Cough - Start Nexium, counseled on GERD causing foods. - She does not use her albuterol inhaler much because it does not help, would like to start management for GERD and step up evaluation as needed.  11. Influenza vaccine needed - Flu Vaccine QUAD 36+ mos IM  12. Family history of diabetes mellitus - Negative for diabetes today, advised dietary modifications and start exercising to minimize risk factors. Patient agreed.  Wallis Bamberg, PA-C [facility] [phone] 01/20/2015  1:07 PM

## 2015-01-20 NOTE — Patient Instructions (Signed)
Keeping You Healthy  Get These Tests 1. Blood Pressure- Have your blood pressure checked once a year by your health care provider.  Normal blood pressure is 120/80. 2. Weight- Have your body mass index (BMI) calculated to screen for obesity.  BMI is measure of body fat based on height and weight.  You can also calculate your own BMI at [email or web address]://[email or web address]-[email or web address]/www.[email or web address]/. 3. Cholesterol- Have your cholesterol checked every 5 years starting at age 42 then yearly starting at age 42. 4. Chlamydia, HIV, and other sexually transmitted diseases- Get screened every year until age 42, then within three months of each new sexual provider. 5. Pap Test - Every 1-5 years; discuss with your health care provider. 6. Mammogram- Every 1-2 years starting at age 42--50  Take these medicines  Calcium with Vitamin D-Your body needs 1200 mg of Calcium each day and [phone] IU of Vitamin D daily.  Your body can only absorb 500 mg of Calcium at a time so Calcium must be taken in 2 or 3 divided doses throughout the day.  Multivitamin with folic acid- Once daily if it is possible for you to become pregnant.  Get these Immunizations  Gardasil-Series of three doses; prevents HPV related illness such as genital warts and cervical cancer.  Menactra-Single dose; prevents meningitis.  Tetanus shot- Every 10 years.  Flu shot-Every year.  Take these steps 1. Do not smoke-Your healthcare provider can help you quit.  For tips on how to quit go to www.smokefree.gov or call 1-800 QUITNOW. 2. Be physically active- Exercise 5 days a week for at least 30 minutes.  If you are not already physically active, start slow and gradually work up to 30 minutes of moderate physical activity.  Examples of moderate activity include walking briskly, dancing, swimming, bicycling, etc. 3. Breast Cancer- A self breast exam every month is important for early detection of breast cancer.  For more information and instruction on self breast exams, ask your  healthcare provider or [email or web address].eswww.womenshealth.gov/faq/breast-self-exam.cfm. 4. Eat a healthy diet- Eat a variety of healthy foods such as fruits, vegetables, whole grains, low fat milk, low fat cheeses, yogurt, lean meats, poultry and fish, beans, nuts, tofu, etc.  For more information go to www. Thenutritionsource.org 5. Drink alcohol in moderation- Limit alcohol intake to one drink or less per day. Never drink and drive. 6. Depression- Your emotional health is as important as your physical health.  If you're feeling down or losing interest in things you normally enjoy please talk to your healthcare provider about being screened for depression. 7. Dental visit- Brush and floss your teeth twice daily; visit your dentist twice a year. 8. Eye doctor- Get an eye exam at least every 2 years. 9. Helmet use- Always wear a helmet when riding a bicycle, motorcycle, rollerblading or skateboarding. 10. Safe sex- If you may be exposed to sexually transmitted infections, use a condom. 11. Seat belts- Seat belts can save your live; always wear one. 12. Smoke/Carbon Monoxide detectors- These detectors need to be installed on the appropriate level of your home. Replace batteries at least once a year. 13. Skin cancer- When out in the sun please cover up and use sunscreen 15 SPF or higher. 14. Violence- If anyone is threatening or hurting you, please tell your healthcare provider.    Food Choices for Gastroesophageal Reflux Disease, Adult When you have gastroesophageal reflux disease (GERD), the foods you eat and your eating habits are very important. Choosing the right foods can help ease the discomfort of  GERD. WHAT GENERAL GUIDELINES DO I NEED TO FOLLOW?  Choose fruits, vegetables, whole grains, low-fat dairy products, and low-fat meat, fish, and poultry.  Limit fats such as oils, salad dressings, butter, nuts, and avocado.  Keep a food diary to identify foods that cause symptoms.  Avoid foods that cause  reflux. These may be different for different people.  Eat frequent small meals instead of three large meals each day.  Eat your meals slowly, in a relaxed setting.  Limit fried foods.  Cook foods using methods other than frying.  Avoid drinking alcohol.  Avoid drinking large amounts of liquids with your meals.  Avoid bending over or lying down until 2-3 hours after eating. WHAT FOODS ARE NOT RECOMMENDED? The following are some foods and drinks that may worsen your symptoms: Vegetables Tomatoes. Tomato juice. Tomato and spaghetti sauce. Chili peppers. Onion and garlic. Horseradish. Fruits Oranges, grapefruit, and lemon (fruit and juice). Meats High-fat meats, fish, and poultry. This includes hot dogs, ribs, ham, sausage, salami, and bacon. Dairy Whole milk and chocolate milk. Sour cream. Cream. Butter. Ice cream. Cream cheese.  Beverages Coffee and tea, with or without caffeine. Carbonated beverages or energy drinks. Condiments Hot sauce. Barbecue sauce.  Sweets/Desserts Chocolate and cocoa. Donuts. Peppermint and spearmint. Fats and Oils High-fat foods, including [place] fries and potato chips. Other Vinegar. Strong spices, such as black pepper, white pepper, red pepper, cayenne, curry powder, cloves, ginger, and chili powder. The items listed above may not be a complete list of foods and beverages to avoid. Contact your dietitian for more information.   This information is not intended to replace advice given to you by your health care provider. Make sure you discuss any questions you have with your health care provider.   Document Released: 03/19/2005 Document Revised: 04/09/2014 Document Reviewed: 01/21/2013 Elsevier Interactive Patient Education [employer]

## 2015-01-21 LAB — PAP IG W/ RFLX HPV ASCU

## 2015-01-21 LAB — TSH: TSH: 2.388 u[IU]/mL (ref 0.350–4.500)

## 2015-01-23 LAB — VITAMIN D 1,25 DIHYDROXY
VITAMIN D 1, 25 (OH) TOTAL: 56 pg/mL (ref 18–72)
Vitamin D2 1, 25 (OH)2: 39 pg/mL
Vitamin D3 1, 25 (OH)2: 17 pg/mL

## 2015-01-24 ENCOUNTER — Encounter: Payer: Self-pay | Admitting: Family Medicine

## 2015-01-24 LAB — IRON AND TIBC
%SAT: 6 % — AB (ref 11–50)
Iron: 22 ug/dL — ABNORMAL LOW (ref 40–190)
TIBC: 379 ug/dL (ref 250–450)
UIBC: 357 ug/dL (ref 125–400)

## 2015-01-24 LAB — FERRITIN: FERRITIN: 7 ng/mL — AB (ref 10–291)

## 2015-01-24 LAB — VITAMIN B12: VITAMIN B 12: 172 pg/mL — AB (ref 211–911)

## 2015-01-26 ENCOUNTER — Telehealth: Payer: Self-pay

## 2015-01-26 DIAGNOSIS — D509 Iron deficiency anemia, unspecified: Secondary | ICD-10-CM

## 2015-01-26 NOTE — Telephone Encounter (Signed)
Brenda Bell, esomeprazole is not covered under pt's ins. They probably prefer pantoprazole and omeprazole. Do you want to change it to one of these?

## 2015-01-27 MED ORDER — FERROUS SULFATE 325 (65 FE) MG PO TABS: 325.0000 mg | ORAL_TABLET | Freq: Two times a day (BID) | ORAL | Status: DC

## 2015-01-27 MED ORDER — SENNOSIDES-DOCUSATE SODIUM 8.6-50 MG PO TABS: 1.0000 | ORAL_TABLET | Freq: Two times a day (BID) | ORAL | Status: DC | PRN

## 2015-01-27 NOTE — Telephone Encounter (Signed)
Will start iron supplement. Patient will f/u in 1 week. I will do more blood work to evaluate vitamin deficiency.

## 2015-01-27 NOTE — Telephone Encounter (Signed)
Yes please, lets change it to omeprazole. Thank you!

## 2015-01-27 NOTE — Telephone Encounter (Signed)
Called patient to discuss results. She has iron and Vitamin B12 deficiency. I need to work this up further and would like to see patient back for additional labs. Please communicate this to the patient if she calls back. Her TSH, CMET, pap smear was fine. She does have low HDL (good) cholesterol so I would recommend an omega-3 fatty acid supplement or fish or krill oil supplement. If an appointment can be set up I would like this to happen. Thank you!

## 2015-01-28 MED ORDER — OMEPRAZOLE 20 MG PO CPDR: 20.0000 mg | DELAYED_RELEASE_CAPSULE | Freq: Every day | ORAL | Status: DC

## 2015-01-28 NOTE — Addendum Note (Signed)
Addended by: Sheppard PlumberBRIGGS, Calirose Mccance A on: 01/28/2015 03:58 PM   Modules accepted: Orders

## 2015-01-28 NOTE — Telephone Encounter (Signed)
Thank you, Britta MccreedyBarbara. I signed it. She is supposed to f/u with me next week.

## 2015-01-28 NOTE — Addendum Note (Signed)
Addended by: Wallis BambergMANI, Cosmo Tetreault on: 01/28/2015 06:11 PM   Modules accepted: Orders

## 2015-01-28 NOTE — Telephone Encounter (Signed)
Brenda Bell, I pended a Rx for omeprazole, but wanted to check strength you wanted to Rx. I pended the 20 mg since you had Rxd the lower dose esomeprazole originally. Did you talk to the pt about your f/up plan and further work up in a week, or do we need to try to reach her?

## 2015-02-03 ENCOUNTER — Ambulatory Visit (INDEPENDENT_AMBULATORY_CARE_PROVIDER_SITE_OTHER): Payer: 59 | Admitting: Urgent Care

## 2015-02-03 ENCOUNTER — Encounter: Payer: Self-pay | Admitting: Urgent Care

## 2015-02-03 VITALS — BP 122/79 | HR 74 | Temp 98.1°F | Resp 16 | Ht 65.0 in | Wt 181.0 lb

## 2015-02-03 DIAGNOSIS — E559 Vitamin D deficiency, unspecified: Secondary | ICD-10-CM

## 2015-02-03 DIAGNOSIS — D519 Vitamin B12 deficiency anemia, unspecified: Secondary | ICD-10-CM | POA: Diagnosis not present

## 2015-02-03 MED ORDER — CYANOCOBALAMIN 1000 MCG/ML IJ SOLN: 1000.0000 ug | Freq: Once | INTRAMUSCULAR | Status: DC

## 2015-02-03 MED ORDER — VITAMIN D (ERGOCALCIFEROL) 1.25 MG (50000 UNIT) PO CAPS: 50000.0000 [IU] | ORAL_CAPSULE | ORAL | Status: DC

## 2015-02-03 MED ORDER — CYANOCOBALAMIN 1000 MCG/ML IJ SOLN
1000.0000 ug | INTRAMUSCULAR | Status: AC
  Administered 2015-02-03: 1000 ug via INTRAMUSCULAR

## 2015-02-03 NOTE — Patient Instructions (Signed)
Cyanocobalamin, Vitamin B12 injection Qu es este medicamento? La CIANOCOBALAMINA es una forma de vitamina B12 artificial. La vitamina B12 se utiliza para el desarrollo de clulas sanguneas, clulas nerviosas y protenas saludables en el cuerpo. Tambin ayuda con el metabolizacin normal de grasas y carbohidratos. Este medicamento se [place] para tratar [employer] no pueden absorber la vitamina B12. Este medicamento puede ser utilizado para otros usos; si tiene alguna pregunta consulte con su proveedor de atencin mdica o con su farmacutico. Qu le debo informar a mi profesional de la salud antes de tomar este medicamento? Necesita saber si usted presenta alguno de los [employer] o situaciones: -enfermedad renal -enfermedad de [place] -anemia megaloblstica -una reaccin alrgica o inusual a la cianocobalamina, al cobalto, a otros medicamentos, alimentos, colorantes o conservantes -si est embarazada o buscando quedar embarazada -si est amamantando a un beb Cmo debo utilizar este medicamento? Este medicamento se administra mediante inyeccin por va intramuscular o por va subcutnea profunda. Por lo general, lo administra un profesional de Radiographer, therapeutic en un hospital o en un entorno clnico. Sin embargo, su mdico puede ensearle cmo administrarse sus propias inyecciones. Siga todas las instrucciones. Hable con su pediatra para informarse acerca del uso de este medicamento en nios. Puede requerir atencin especial. Sobredosis: Pngase en contacto inmediatamente con un centro toxicolgico o una sala de urgencia si usted cree que haya tomado demasiado medicamento. ATENCIN: [employer] es solo para usted. No comparta este medicamento con nadie. Qu sucede si me olvido de una dosis? Si recibe sus dosis en un entorno clnico o una oficina de mdico, comunquese para programar otra cita. Si se administra las inyecciones usted mismo y Rosine dosis, sela lo antes posible.  Si es casi la hora de la prxima dosis, use slo esa dosis. No use dosis adicionales o dobles. Qu puede interactuar con este medicamento? -colchicina -consumir mucho alcohol Puede ser que esta lista no menciona todas las posibles interacciones. Informe a su profesional de [employer] de [employer] Inc productos a base de hierbas, medicamentos de [place] o suplementos nutritivos que est tomando. Si usted fuma, consume bebidas alcohlicas o si utiliza drogas ilegales, indqueselo tambin a su profesional de [employer]. Algunas sustancias pueden interactuar con su medicamento. A qu debo estar atento al usar PPL Corporation? Visite a su mdico o a su profesional de Designer, jewellery. Tal vez necesitar realizarse ARAM[employer] Corporation de sangre mientras reciba este medicamento. Puede ser necesario seguir una dieta. Consulte a su mdico acerca de esto. Para obtener los Safeco Corporation, limite su consumo de alcohol y evite fumar. Qu efectos secundarios puedo tener al [employer] este medicamento? Efectos secundarios que debe informar a su mdico o a Producer, television/film/video de la salud tan pronto como sea posible: -Therapist, art como erupcin cutnea, picazn o urticarias, hinchazn de la cara, labios o lengua -color azulado de la piel -dolor, opresin en el pecho -dificultad al respirar, sibilancias -mareos -rea enrojecido, hinchazn doloroso de la piel Efectos secundarios que, por lo general, no requieren Psychologist, prison and probation services (debe informarlos a su mdico o a Producer, television/film/video de la salud si persisten o si son molestos): -diarrea -dolor de cabeza Puede ser que esta lista no menciona todos los posibles efectos secundarios. Comunquese a su mdico por asesoramiento mdico [employer]lett[employer]ard. Usted puede informar los efectos secundarios a la FDA por telfono al 1-800-FDA-1088. Dnde debo guardar mi medicina? Mantngala fuera del alcance de los nios. Gurdela a Sanmina-SCI, entre 15 y 30 grados  C (59 y 78 grados F). Protjala de la luz. Deseche todo el medicamento que no haya utilizado, despus de la fecha de vencimiento. ATENCIN: Este folleto es un resumen. Puede ser que no cubra toda la posible informacin. Si usted tiene preguntas acerca de esta medicina, consulte con su mdico, su farmacutico o su profesional de Radiographer, therapeutic.    2016, Elsevier/Gold Standard. (2014-05-11 00:00:00)   Dficit de vitamina B12  (Vitamin B12 Deficiency) Se denomina dficit a no tener la cantidad suficiente de vitamina B 12. La vitamina B12 es importante. El organismo la necesita para:   Producir glbulos rojos.  Hacer el ADN. Este es el material gentico que se Occupational psychologist en el interior de las clulas.  Ayuda a que los nervios funcionen adecuadamente de modo que puedan transmitir los mensajes desde el cerebro al organismo. CAUSAS   No comer suficientes alimentos que contengan vitamina B12.  No tener suficiente cido y [employer] . El organismo los necesita para Environmental health practitioner la vitamina B12 de los alimentos que se consumen.  Sufrir Theatre manager del sistema digestivo que dificultan la absorcin de la vitamina B12. Estas enfermedades incluyen la enfermedad de Crohn, pancreatitis crnica y fibrosis [place].  Tener anemia perniciosa, que es una enfermedad en la que el organismo tiene muy pocos glbulos rojos. Las personas con esta afeccin no producen suficiente cantidad de una protena llamada "factor intrnseco", que es necesaria para absorber la vitamina B12.  Haber pasado por Neomia Dear ciruga en la que se elimina una parte del estmago o el intestino delgado.  Tomar ciertos medicamentos que hacen que sea difcil para el cuerpo para absorber la vitamina B12. Estos medicamentos son:  Medicamentos para la Engineering geologist (anticidos e inhibidores de la bomba de protones).  Ciertos antibiticos como la neomicina, que se utilizan para combatir las infecciones  Algunos medicamentos para  combatir la diabetes, la tuberculosis, la gota y el colesterol elevado. FACTORES DE RIESGO  Los factores de riesgo son los que hacen ms probable que se sufra un dficit de vitamina B12. Ellos son:   Ser mayor de 6 aos.  Ser vegetariano.  Kennon Holter y ser vegetariana o consumir una dieta pobre.  Tomar ciertos frmacos  Ser alcohlico. SNTOMAS  Puede tener dficit de vitamina B12 y no tener sntomas. Sin embargo, el dficit de vitamina B12 puede causar problemas de [place], como anemia y lesiones nerviosas. Estos problemas de salud pueden manifestarse con ciertos sntomas como:   Debilidad.  Fatiga.  Prdida del apetito.  Prdida de peso.  Hormigueos o adormecimiento en las manos o en los pies.  Enrojecimiento y Agricultural engineer.  Confusin o problemas de memoria.  Depresin.  Mareos.  Problemas sensoriales, como prdida del gusto, ceguera para los colores y ruidos Teachers Insurance and Annuity Association odos.  Diarrea o constipacin.  Dificultad para caminar. DIAGNSTICO  Le indicarn varios tipos de pruebas para diagnosticar la causa del dficit de vitamina B12. Estas pruebas son:   Un recuento sanguneo completo (CBC). Esta Medical laboratory scientific officer al mdico una idea general de los componentes de la [place].  Anlisis de sangre para medir el nivel de B12.  Anlisis de sangre para medir el factor intrnseco.  Una endoscopia. Este procedimiento [place] un tubo delgado con una cmara en el extremo para observar el interior del estmago o los intestinos TRATAMIENTO  El tratamiento del dficit de vitamina B12 depende de la causa. Las opciones ms frecuentes son:   Modificar los hbitos alimenticios y bebidas, por ejemplo:  Consumir ms alimentos que contengan vitamina B12.  No beber gran cantidad de alcohol o evitarlo.  Tomar suplementos de vitamina B12. Su mdico le indicar la dosis ms conveniente para usted.  Aplicarse inyecciones de vitamina B12. Algunas personas las reciben ms de una vez  por [place]. Otras una vez al mes. INSTRUCCIONES PARA EL CUIDADO EN EL HOGAR   Tome todos los suplementos segn le indic su mdico. Siga cuidadosamente las indicaciones.  Aplquese las inyecciones que el mdico le recete. No deje de concurrir a las visitas de control.  Consuma gran cantidad de alimentos saludables que contengan vitamina B12. Pregntele a su mdico si debe consultar a un nutricionista. Algunos alimentos buenos para incluir en la dieta son:  Carnes.  Aves.  Pescado.  Huevos.  Cereales fortificados y productos lcteos. Esto significa que se ha agregado vitamina B12 a los alimentos. Verifique la etiqueta en el envase para estar seguro.  No consuma alcohol en exceso.  Cumpla con todas las visitas de control. El mdico necesitar indicar anlisis de sangre para asegurarse que el dficit de vitamina B12 ha mejorado SOLICITE ATENCIN MDICA SI:   Tiene preguntas relacionadas con el tratamiento  Los sntomas vuelven a Research officer, trade unionaparecer. ASEGRESE DE QUE:   Comprende estas instrucciones.  Controlar su enfermedad.  Solicitar ayuda de inmediato si no mejora o si empeora.   Esta informacin no tiene Theme park managercomo fin reemplazar el consejo del mdico. Asegrese de hacerle al mdico cualquier pregunta que tenga.   Document Released: 06/11/2011 Elsevier Interactive Patient Education 2016 [employer]vier [employer].    Deficiencia de vitaminaD (Vitamin D Deficiency) La deficiencia de vitaminaD ocurre cuando el organismo no tiene suficiente vitaminaD. La vitaminaD es importante para el organismo por muchos motivos:  Ayuda al organismo a Insurance underwriterabsorber dos minerales llamados calcio y fsforo.  Desempea un papel en la salud de los [place]huesos.  Puede ayudar a prevenir Dollar Generalalgunas enfermedades, como la diabetes y Chartered loss adjusterla esclerosis mltiple.  Desempea un papel en la funcin muscular, incluida la actividad cardaca. Para obtener vitaminaD, puede hacer lo siguiente:  Consuma alimentos que contengan  naturalmente vitaminaD.  Consuma o beba leche u otros productos lcteos con agregado de vitaminaD.  Tome un suplemento de vitaminaD o un suplemento multivitamnico que la contenga.  Expngase al sol. El organismo produce vitaminaD de forma natural cuando se expone la piel a la luz del sol. El organismo transforma la luz del sol en una forma de vitamina que puede Bogotautilizar. Si la deficiencia de vitaminaD es grave, puede causar una enfermedad que Sanmina-SCIreblandece los huesos. En los adultos, se la conoce como osteomalacia. En los nios, recibe el nombre de raquitismo. CAUSAS La deficiencia de vitaminaD puede deberse a lo siguiente:  No comer suficientes alimentos que contengan vitamina D.  Exposicin insuficiente al sol.  Sufrir Theatre managerciertas enfermedades del sistema digestivo que le dificultan al organismo la absorcin de vitaminaD. Estas enfermedades incluyen la enfermedad de Crohn, la pancreatitis crnica y la fibrosis [place] Islandsqustica.  Someterse a una Edison Internationalciruga en la que se extirpa Neomia Dearuna parte del estmago o del intestino delgado.  Ser obeso.  Tener enfermedad renal crnica o enfermedad heptica. FACTORES DE RIESGO Es ms probable que esta afeccin se manifieste en:  Las personas de edad Landisburgavanzada.  Las Eli Lilly and Companypersonas que no pasan mucho tiempo al OGE Energyaire libre.  Las personas que viven en un centro de atencin de Glass blower/designerlarga estancia.  Las personas que sufrieron fracturas seas.  Las personas que tienen huesos dbiles o delgados (osteoporosis).  Las personas que sufren una enfermedad  o un trastorno que modifica la forma en que el organismo absorbe la vitaminaD.  Las personas de Albertson's.  Las personas que toman algunos medicamentos, como corticoides o determinados anticonvulsivos.  Las personas con sobrepeso u obesidad. SNTOMAS En los casos leves de deficiencia de vitaminaD, puede no haber sntomas. Si el trastorno es grave, los sntomas pueden incluir lo siguiente:  Kerr[employer]McGee.  Dolor  muscular.  Caerse con frecuencia.  Huesos fracturados por Futures trader. DIAGNSTICO Por lo general, este trastorno se diagnostica mediante un anlisis de [place].  TRATAMIENTO El tratamiento de este trastorno puede depender de la causa. Entre las otras opciones de [place], se incluyen las siguientes:  Tomar suplementos de vitamina D.  Tomar un suplemento de calcio. El Social worker cul es la dosis ms [place] para usted. INSTRUCCIONES PARA EL CUIDADO EN EL HOGAR  Tome los medicamentos y los suplementos solamente como se lo haya indicado el mdico.  Consuma alimentos que contengan vitaminaD, como por ejemplo:  Productos lcteos, cereales o jugos fortificados. El trmino fortificado significa que se ha aadido vitaminaD al [employer]. Revise la etiqueta del paquete para estar seguro.  Pescados grasos, como el salmn o la [place].  Huevos.  Ostras.  No utilice camas solares.  Mantenga un peso saludable. Baje de peso, si es necesario.  Concurra a todas las visitas de control como se lo haya indicado el mdico. Esto es importante. SOLICITE ATENCIN MDICA SI:  Los sntomas no desaparecen.  Tiene malestar estomacal (nuseas) o vomita.  Defeca menos que lo habitual o le resulta difcil defecar (estreimiento).   Esta informacin no tiene Theme park manager el consejo del mdico. Asegrese de hacerle al mdico cualquier pregunta que tenga.   Document Released: 06/11/2011 Document Revised: 12/08/2014 Elsevier Interactive Patient Education [employer].

## 2015-02-03 NOTE — Progress Notes (Signed)
    MRN: [id number] DOB: 29-Sep-1970  Subjective:   Brenda Bell is a 44 y.o. female presenting for chief complaint of Follow-up and Anemia  Patient was seen here on 01/26/2015 for an annual physical exam. Her showed significant anemia. She has since started an iron supplement with stool softener as instructed. She admits strong history of anemia. However, I also found her to have Vitamin D and B12 deficiency. She admits longstanding history of irregular bowel movements including loose stools, constipation and diarrhea. She has also had intermittent bony pain, bloating, fatigue, cold intolerance. She has not previously been evaluated by GI for GI bleed. Denies any other aggravating or relieving factors, no other questions or concerns.  Brenda Bell has a current medication list which includes the following prescription(s): albuterol, ferrous sulfate, senna-docusate, cyanocobalamin, and vitamin d (ergocalciferol). Also has No Known Allergies.  Brenda Bell  has no past medical history on file. Also  has past surgical history that includes Cesarean section; Tonsillectomy (Bilateral); Ovary surgery (Right); Breast surgery (Left); and Knee surgery (Right).  Objective:   Vitals: BP 122/79 mmHg  Pulse 74  Temp(Src) 98.1 F (36.7 C)  Resp 16  Ht 5\' 5"  (1.651 m)  Wt 181 lb (82.101 kg)  BMI 30.12 kg/m2  LMP 12/23/2014  Physical Exam  Constitutional: She is oriented to person, place, and time. She appears well-developed and well-nourished.  HENT:  Mouth/Throat: Oropharynx is clear and moist.  Eyes: No scleral icterus.  Neck: Normal range of motion. Neck supple. No thyromegaly present.  Cardiovascular: Normal rate, regular rhythm and intact distal pulses.  Exam reveals no gallop and no friction rub.   No murmur heard. Pulmonary/Chest: No respiratory distress. She has no wheezes. She has no rales.  Abdominal: Soft. Bowel sounds are normal. She exhibits no distension and no mass. There is no tenderness.    Musculoskeletal: She exhibits no edema.  Neurological: She is alert and oriented to person, place, and time.  Skin: Skin is warm and dry. No rash noted. No erythema. No pallor.  Psychiatric: She has a normal mood and affect.    Assessment and Plan :   1. Anemia due to vitamin B12 deficiency 2. Hypocalcemia 3. Vitamin D deficiency - Continue iron supplement and stool softener. Will further evaluation for pernicious anemia, possible hyperparathyroidsim. B12 injections to be done monthly. Vit D supplementation. Consider referral to GI. Follow up with labs by phone.  Wallis BambergMario Shasha Buchbinder, PA-C [facility] [phone] 02/03/2015 4:25 PM

## 2015-02-04 LAB — PTH, INTACT AND CALCIUM
Calcium: 9.4 mg/dL (ref 8.4–10.5)
PTH: 50 pg/mL (ref 14–64)

## 2015-02-08 LAB — INTRINSIC FACTOR ANTIBODIES: INTRINSIC FACTOR: NEGATIVE

## 2015-02-11 ENCOUNTER — Telehealth: Payer: Self-pay | Admitting: Urgent Care

## 2015-02-11 DIAGNOSIS — E538 Deficiency of other specified B group vitamins: Secondary | ICD-10-CM

## 2015-02-11 DIAGNOSIS — E559 Vitamin D deficiency, unspecified: Secondary | ICD-10-CM

## 2015-02-11 DIAGNOSIS — D509 Iron deficiency anemia, unspecified: Secondary | ICD-10-CM

## 2015-02-11 DIAGNOSIS — M2550 Pain in unspecified joint: Secondary | ICD-10-CM

## 2015-02-11 NOTE — Telephone Encounter (Signed)
Reported normal PTH, IF. At this point, I will patient to GI for further evaluation of Vit B12, D and iron deficiency anemia.

## 2015-03-07 ENCOUNTER — Encounter: Payer: Self-pay | Admitting: Internal Medicine

## 2015-04-03 DIAGNOSIS — Z8719 Personal history of other diseases of the digestive system: Secondary | ICD-10-CM

## 2015-04-03 HISTORY — DX: Personal history of other diseases of the digestive system: Z87.19

## 2015-04-07 ENCOUNTER — Encounter: Payer: Self-pay | Admitting: *Deleted

## 2015-05-03 ENCOUNTER — Ambulatory Visit (INDEPENDENT_AMBULATORY_CARE_PROVIDER_SITE_OTHER): Payer: 59 | Admitting: Internal Medicine

## 2015-05-03 ENCOUNTER — Other Ambulatory Visit (INDEPENDENT_AMBULATORY_CARE_PROVIDER_SITE_OTHER): Payer: 59

## 2015-05-03 ENCOUNTER — Encounter: Payer: Self-pay | Admitting: Internal Medicine

## 2015-05-03 VITALS — BP 108/80 | HR 69 | Ht 65.0 in | Wt 186.0 lb

## 2015-05-03 DIAGNOSIS — D509 Iron deficiency anemia, unspecified: Secondary | ICD-10-CM

## 2015-05-03 DIAGNOSIS — E538 Deficiency of other specified B group vitamins: Secondary | ICD-10-CM | POA: Diagnosis not present

## 2015-05-03 DIAGNOSIS — K625 Hemorrhage of anus and rectum: Secondary | ICD-10-CM | POA: Diagnosis not present

## 2015-05-03 DIAGNOSIS — R194 Change in bowel habit: Secondary | ICD-10-CM

## 2015-05-03 DIAGNOSIS — Z8379 Family history of other diseases of the digestive system: Secondary | ICD-10-CM

## 2015-05-03 LAB — CBC WITH DIFFERENTIAL/PLATELET
BASOS PCT: 0.6 % (ref 0.0–3.0)
Basophils Absolute: 0 10*3/uL (ref 0.0–0.1)
EOS ABS: 0.1 10*3/uL (ref 0.0–0.7)
EOS PCT: 2.6 % (ref 0.0–5.0)
HEMATOCRIT: 38.3 % (ref 36.0–46.0)
HEMOGLOBIN: 12.6 g/dL (ref 12.0–15.0)
LYMPHS PCT: 43.2 % (ref 12.0–46.0)
Lymphs Abs: 1.4 10*3/uL (ref 0.7–4.0)
MCHC: 32.8 g/dL (ref 30.0–36.0)
MCV: 83.9 fl (ref 78.0–100.0)
Monocytes Absolute: 0.5 10*3/uL (ref 0.1–1.0)
Monocytes Relative: 15.4 % — ABNORMAL HIGH (ref 3.0–12.0)
NEUTROS ABS: 1.3 10*3/uL — AB (ref 1.4–7.7)
Neutrophils Relative %: 38.2 % — ABNORMAL LOW (ref 43.0–77.0)
PLATELETS: 219 10*3/uL (ref 150.0–400.0)
RBC: 4.57 Mil/uL (ref 3.87–5.11)
RDW: 16.4 % — AB (ref 11.5–15.5)
WBC: 3.3 10*3/uL — AB (ref 4.0–10.5)

## 2015-05-03 LAB — IBC PANEL
Iron: 56 ug/dL (ref 42–145)
Saturation Ratios: 11.8 % — ABNORMAL LOW (ref 20.0–50.0)
Transferrin: 338 mg/dL (ref 212.0–360.0)

## 2015-05-03 LAB — FOLATE: FOLATE: 17.9 ng/mL (ref >5.9)

## 2015-05-03 LAB — IGA: IGA: 302 mg/dL (ref 68–378)

## 2015-05-03 LAB — FERRITIN: Ferritin: 8.6 ng/mL — ABNORMAL LOW (ref 10.0–291.0)

## 2015-05-03 LAB — TSH: TSH: 3.48 u[IU]/mL (ref 0.35–4.50)

## 2015-05-03 LAB — VITAMIN B12: VITAMIN B 12: 214 pg/mL (ref 211–911)

## 2015-05-03 MED ORDER — INTEGRA 62.5-62.5-40-3 MG PO CAPS: ORAL_CAPSULE | ORAL | Status: DC

## 2015-05-03 MED ORDER — NA SULFATE-K SULFATE-MG SULF 17.5-3.13-1.6 GM/177ML PO SOLN: ORAL | Status: DC

## 2015-05-03 NOTE — Patient Instructions (Signed)
You have been scheduled for an endoscopy and colonoscopy. Please follow the written instructions given to you at your visit today. Please pick up your prep supplies at the pharmacy within the next 1-3 days. If you use inhalers (even only as needed), please bring them with you on the day of your procedure. Your physician has requested that you go to www.[email or web address] and enter the access code given to you at your visit today. This web site gives a general overview about your procedure. However, you should still follow specific instructions given to you by our office regarding your preparation for the procedure.  Your physician has requested that you go to the basement for the following lab work before leaving today: CBC, IBC, Celiac, B12, Folate, TSH  Please purchase the following medications over the counter and take as directed: B12 1000 mg daily  We have sent the following medications to your pharmacy for you to pick up at your convenience: Integra 1 capsule daily. Call our office if you are unable to tolerate this.

## 2015-05-03 NOTE — Progress Notes (Signed)
Patient ID: Brenda Bell, female   DOB: 15-Sep-1970, 45 y.o.   MRN: [id number] HPI:  Brenda Bell is a 45 year old female with a past medical history of B12 deficiency, vitamin D deficiency and iron deficiency anemia who seen inconsultation at the request of Jaynee Eagles, PA-C  To evaluate iron deficiency anemia, alternating bowel habits and rectal bleeding. She's here today with her husband and a Spanish interpreter. She reports she has long-standing issues with anemia dating back to the 1990s. She has been on and off of oral iron which often upsets her stomach and leads to discontinuation. She's been back on oral iron since October but stopped it a month ago again due to upset stomach. She reports trouble with alternating diarrhea and constipation. Neither diarrhea nor constipation are predominant for her. She reports issues with rectal bleeding which she describes as bright red. At times this is painful and sharp with passing stool at other times it seems pain less. She was having issue with particular right lower quadrant abdominal pain but this has resolved. She does occasionally have lower bilateral cramping pain alleviated by defecation. She reports a good appetite with no nausea, vomiting, dysphagia or odynophagia. She denies melena. She denies early satiety or weight loss. She does report fatigue and low energy levels. Family history notable for a mother with colitis but denies a family history of colon polyps or cancer. She status post cholecystectomy, 3 previous cesarean sections, and history of removal of an ovarian cyst.  Regarding B12 deficiency she took an IM B12 dose but never returned for subsequent injections  She does endorse heavy menstrual periods lasting 5 days. 2 of her 5 day periods are heavy bleeding. She has a cycle every 23-24 days.  Past Medical History  Diagnosis Date  . B12 deficiency   . Vitamin D deficiency   . Anemia     Past Surgical History  Procedure Laterality Date   . Cesarean section    . Tonsillectomy Bilateral   . Ovary surgery Right     benign tumor of right ovary removed, when she was 46 years of age  . Breast surgery Left     cyst removed  . Knee surgery Right     tendon repair    Outpatient Prescriptions Prior to Visit  Medication Sig Dispense Refill  . albuterol (PROVENTIL HFA;VENTOLIN HFA) 108 (90 BASE) MCG/ACT inhaler Inhale 1-2 puffs into the lungs every 6 (six) hours as needed for wheezing or shortness of breath. 1 Inhaler 1  . ferrous sulfate 325 (65 FE) MG tablet Take 1 tablet (325 mg total) by mouth 2 (two) times daily with a meal. 60 tablet 3  . senna-docusate (SENOKOT-S) 8.6-50 MG tablet Take 1 tablet by mouth 2 (two) times daily as needed for moderate constipation. 60 tablet 2  . Vitamin D, Ergocalciferol, (DRISDOL) 50000 UNITS CAPS capsule Take 1 capsule (50,000 Units total) by mouth every 7 (seven) days. 4 capsule 6   Facility-Administered Medications Prior to Visit  Medication Dose Route Frequency Provider Last Rate Last Dose  . cyanocobalamin ((VITAMIN B-12)) injection 1,000 mcg  1,000 mcg Intramuscular Q30 days Jaynee Eagles, PA-C   1,000 mcg at 02/03/15 1645    No Known Allergies  Family History  Problem Relation Age of Onset  . Hypertension Mother   . Diabetes Father     Social History  Substance Use Topics  . Smoking status: Never Smoker   . Smokeless tobacco: Never Used  . Alcohol Use: No  ROS: As per history of present illness, otherwise negative  BP 108/80 mmHg  Pulse 69  Ht 5' 5"  (1.651 m)  Wt 186 lb (84.369 kg)  BMI 30.95 kg/m2 Constitutional: Well-developed and well-nourished. No distress. HEENT: Normocephalic and atraumatic. Oropharynx is clear and moist. No oropharyngeal exudate. Conjunctivae are normal.  No scleral icterus. Neck: Neck supple. Trachea midline. Cardiovascular: Normal rate, regular rhythm and intact distal pulses. No M/R/G Pulmonary/chest: Effort normal and breath sounds normal.  No wheezing, rales or rhonchi. Abdominal: Soft, nontender, nondistended. Bowel sounds active throughout. There are no masses palpable. No hepatosplenomegaly. Extremities: no clubbing, cyanosis, or edema Lymphadenopathy: No cervical adenopathy noted. Neurological: Alert and oriented to person place and time. Skin: Skin is warm and dry. No rashes noted. Psychiatric: Normal mood and affect. Behavior is normal.  RELEVANT LABS AND IMAGING: CBC    Component Value Date/Time   WBC 2.4* 01/20/2015 1358   RBC 4.13 01/20/2015 1358   RBC 4.15 03/09/2013 0943   HGB 10.3* 01/20/2015 1358   HCT 33.5* 01/20/2015 1358   PLT 252 01/20/2015 1358   MCV 81.1 01/20/2015 1358   MCH 24.9* 01/20/2015 1358   MCHC 30.7 01/20/2015 1358   RDW 17.1* 01/20/2015 1358   LYMPHSABS 1.8 08/19/2013 1723   MONOABS 0.7 08/19/2013 1723   EOSABS 0.2 08/19/2013 1723   BASOSABS 0.0 08/19/2013 1723    CMP     Component Value Date/Time   NA 138 01/20/2015 1358   K 4.0 01/20/2015 1358   CL 107 01/20/2015 1358   CO2 25 01/20/2015 1358   GLUCOSE 86 01/20/2015 1358   BUN 8 01/20/2015 1358   CREATININE 0.44* 01/20/2015 1358   CREATININE 0.48 05/26/2009 2151   CALCIUM 9.4 02/03/2015 1623   PROT 6.4 01/20/2015 1358   ALBUMIN 3.4* 01/20/2015 1358   AST 13 01/20/2015 1358   ALT 13 01/20/2015 1358   ALKPHOS 33 01/20/2015 1358   BILITOT 0.4 01/20/2015 1358   GFRNONAA >60 04/22/2009 1145   GFRAA  04/22/2009 1145    >60        The eGFR has been calculated using the MDRD equation. This calculation has not been validated in all clinical situations. eGFR's persistently <60 mL/min signify possible Chronic Kidney Disease.   Iron/TIBC/Ferritin/ %Sat    Component Value Date/Time   IRON 22* 01/20/2015 1358   TIBC 379 01/20/2015 1358   FERRITIN 7* 01/20/2015 1358   IRONPCTSAT 6* 01/20/2015 1358   IRONPCTSAT 6* 01/23/2007 1626   Lab Results  Component Value Date   VITAMINB12 172* 01/20/2015   Intrinsic factor  antibody negative  ASSESSMENT/PLAN: 45 year old female with a past medical history of B12 deficiency, vitamin D deficiency and iron deficiency anemia who seen inconsultation at the request of Jaynee Eagles, PA-C  To evaluate iron deficiency anemia, alternating bowel habits and rectal bleeding.  1. IDA/B12 def/alternating bowel habits/family history of colitis -- I recommended upper endoscopy and colonoscopy given iron deficiency anemia, alternating bowel habits and family history of colon cancer. Unclear cause of B12 deficiency but intrinsic factor antibodies negative. Upper endoscopy to exclude atrophic gastritis. I recommended oral iron supplementation she has been intolerant to ferrous sulfate. Try Integra 1 capsule daily. Notify me if she cannot tolerate this and if so she may need IV iron. Repeat B12 level today along with CBC, iron studies and TSH. Check celiac panel as this can be a cause for iron deficiency anemia and altered bowel habit. We discussed the risks, benefits and alternatives to  upper and lower endoscopy and she is agreeable to proceed. I've asked that she start B12 1000 g orally daily for now though she may need IM therapy.   YD:XAJOI Brooklyn, Pa-c [place], [place] [id number]

## 2015-05-04 LAB — TISSUE TRANSGLUTAMINASE, IGA: Tissue Transglutaminase Ab, IgA: 1 U/mL (ref <4)

## 2015-06-06 ENCOUNTER — Encounter: Payer: Self-pay | Admitting: Internal Medicine

## 2015-06-06 ENCOUNTER — Ambulatory Visit (AMBULATORY_SURGERY_CENTER): Payer: 59 | Admitting: Internal Medicine

## 2015-06-06 VITALS — BP 100/75 | HR 71 | Temp 97.9°F | Resp 12 | Ht 65.0 in | Wt 186.0 lb

## 2015-06-06 DIAGNOSIS — D509 Iron deficiency anemia, unspecified: Secondary | ICD-10-CM | POA: Diagnosis not present

## 2015-06-06 DIAGNOSIS — K2950 Unspecified chronic gastritis without bleeding: Secondary | ICD-10-CM | POA: Diagnosis not present

## 2015-06-06 DIAGNOSIS — R194 Change in bowel habit: Secondary | ICD-10-CM

## 2015-06-06 HISTORY — PX: COLONOSCOPY WITH ESOPHAGOGASTRODUODENOSCOPY (EGD): SHX5779

## 2015-06-06 MED ORDER — SODIUM CHLORIDE 0.9 % IV SOLN: 500.0000 mL | INTRAVENOUS | Status: DC

## 2015-06-06 NOTE — Patient Instructions (Signed)
YOU HAD AN ENDOSCOPIC PROCEDURE TODAY AT [facility]:   Refer to the procedure report that was given to you for any specific questions about what was found during the examination.  If the procedure report does not answer your questions, please call your gastroenterologist to clarify.  If you requested that your care partner not be given the details of your procedure findings, then the procedure report has been included in a sealed envelope for you to review at your convenience later.  YOU SHOULD EXPECT: Some feelings of bloating in the abdomen. Passage of more gas than usual.  Walking can help get rid of the air that was put into your GI tract during the procedure and reduce the bloating. If you had a lower endoscopy (such as a colonoscopy or flexible sigmoidoscopy) you may notice spotting of blood in your stool or on the toilet paper. If you underwent a bowel prep for your procedure, you may not have a normal bowel movement for a few days.  Please Note:  You might notice some irritation and congestion in your nose or some drainage.  This is from the oxygen used during your procedure.  There is no need for concern and it should clear up in a day or so.  SYMPTOMS TO REPORT IMMEDIATELY:   Following lower endoscopy (colonoscopy or flexible sigmoidoscopy):  Excessive amounts of blood in the stool  Significant tenderness or worsening of abdominal pains  Swelling of the abdomen that is new, acute  Fever of 100F or higher   Following upper endoscopy (EGD)  Vomiting of blood or coffee ground material  New chest pain or pain under the shoulder blades  Painful or persistently difficult swallowing  New shortness of breath  Fever of 100F or higher  Black, tarry-looking stools  For urgent or emergent issues, a gastroenterologist can be reached at any hour by calling [phone].   DIET: Your first meal following the procedure should be a small meal and then it is ok to progress to  your normal diet. Heavy or fried foods are harder to digest and may make you feel nauseous or bloated.  Likewise, meals heavy in dairy and vegetables can increase bloating.  Drink plenty of fluids but you should avoid alcoholic beverages for 24 hours.  ACTIVITY:  You should plan to take it easy for the rest of today and you should NOT DRIVE or use heavy machinery until tomorrow (because of the sedation medicines used during the test).    FOLLOW UP: Our staff will call the number listed on your records the next business day following your procedure to check on you and address any questions or concerns that you may have regarding the information given to you following your procedure. If we do not reach you, we will leave a message.  However, if you are feeling well and you are not experiencing any problems, there is no need to return our call.  We will assume that you have returned to your regular daily activities without incident.  If any biopsies were taken you will be contacted by phone or by letter within the next 1-3 weeks.  Please call us at [phone] if you have not heard about the biopsies in 3 weeks.   SIGNATURES/CONFIDENTIALITY: You and/or your care partner have signed paperwork which will be entered into your electronic medical record.  These signatures attest to the fact that that the information above on your After Visit Summary has been reviewed and  is understood.  Full responsibility of the confidentiality of this discharge information lies with you and/or your care-partner.  Await biopsy results  Please call Dr. Lauro FranklinPyrtle's office in the next few days to set up a follow up office appointment  Continue B12 and Iron replacement therapies  If sore or itchy throat continues, please follow up with your family doctor or an ENT (ear, nose and throat doctor)

## 2015-06-06 NOTE — Op Note (Signed)
[facility] [address] [place], [id number]   ENDOSCOPY PROCEDURE REPORT  PATIENT: Brenda Bell, Brenda Bell  MR#: [id number] BIRTHDATE: 22-Apr-1970 , 44  yrs. old GENDER: female ENDOSCOPIST: Beverley FiedlerJay M Rayvon Dakin, MD REFERRED BY:  Doris Cheadleeepak Advani, MD PROCEDURE DATE:  06/06/2015 PROCEDURE:  EGD, diagnostic and EGD w/ biopsy ASA CLASS:     Class II INDICATIONS:  iron deficiency anemia and change in bowel habits. MEDICATIONS: Monitored anesthesia care and Propofol 300 mg IV TOPICAL ANESTHETIC: none  DESCRIPTION OF PROCEDURE: After the risks benefits and alternatives of the procedure were thoroughly explained, informed consent was obtained.  The LB [id number] endoscope was introduced through the mouth and advanced to the second portion of the duodenum , Without limitations.  The instrument was slowly withdrawn as the mucosa was fully examined.  Mild inflammatory change at the base of the tongue.  ESOPHAGUS: The mucosa of the esophagus appeared normal.   Z-line regular at 40 cm.  STOMACH: The mucosa of the stomach appeared normal.  Cold forcep biopsies were taken at the gastric body, antrum and angularis to evaluate for h.  pylori.  DUODENUM: The duodenal mucosa showed no abnormalities in the bulb and 2nd part of the duodenum.  Cold forcep biopsies were taken in the second portion. Retroflexed views revealed no abnormalities.     The scope was then withdrawn from the patient and the procedure completed.  COMPLICATIONS: There were no immediate complications.  ENDOSCOPIC IMPRESSION: 1.   The mucosa of the esophagus appeared normal 2.   The mucosa of the stomach appeared normal; multiple biopsies 3.   The duodenal mucosa showed no abnormalities in the bulb and 2nd part of the duodenum cold forcep biopsies were taken in the second portion  RECOMMENDATIONS: 1.  Await biopsy results 2.  Proceed with Bell Colonoscopy 3.  If sore or itchy throat continues recommend primary  care or ENT visit   eSigned:  Beverley FiedlerJay M Nussen Pullin, MD 06/06/2015 2:01 PM     CC: the patient, Dr. Orpah CobbAdvani

## 2015-06-06 NOTE — Op Note (Signed)
[facility] [address] [place], [id number]   COLONOSCOPY PROCEDURE REPORT  PATIENT: Brenda Bell, Brenda Bell  MR#: [id number] BIRTHDATE: [birth year] , 44  yrs. old GENDER: female ENDOSCOPIST: Beverley FiedlerJay M Pyrtle, MD REFERRED XB:JYNWGNBY:Deepak Advani, MD PROCEDURE DATE:  06/06/2015 PROCEDURE:   Colonoscopy, diagnostic First Screening Colonoscopy - Avg.  risk and is 50 yrs.  old or older - No.  Prior Negative Screening - Now for repeat screening. N/Bell  History of Adenoma - Now for follow-up colonoscopy & has been > or = to 3 yrs.  N/Bell  Polyps removed today? No Recommend repeat exam, <10 yrs? No ASA CLASS:   Class II INDICATIONS:change in bowel habits, rectal bleeding, and iron deficiency anemia. MEDICATIONS: Monitored anesthesia care, this was the total dose used for all procedures at this session, and Propofol 450 mg IV  DESCRIPTION OF PROCEDURE:   After the risks benefits and alternatives of the procedure were thoroughly explained, informed consent was obtained.  The digital rectal exam revealed no abnormalities of the rectum.   The LB [id number]  endoscope was introduced through the anus and advanced to the terminal ileum. No adverse events experienced.   The quality of the prep was good. The instrument was then slowly withdrawn as the colon was fully examined. Estimated blood loss is zero unless otherwise noted in this procedure report.   COLON FINDINGS: Normal examined terminal ileum.  Bell normal appearing cecum, ileocecal valve, and appendiceal orifice were identified. the ascending, transverse, descending, sigmoid colon, and rectum appeared unremarkable.  Retroflexed views revealed small external hemorrhoids. The time to cecum = 1.5 Withdrawal time = 8.4   The scope was withdrawn and the procedure completed.  COMPLICATIONS: There were no complications.  ENDOSCOPIC IMPRESSION: Normal examined terminal ileum and normal colonoscopy  RECOMMENDATIONS: 1.  You should  continue to follow colorectal cancer screening guidelines for "routine risk" patients with Bell repeat colonoscopy in 10 years. 2.  Continue B12 and iron replacement therapy.  Follow B12 levels and hemoglobin and iron studies to ensure improvement. 3.  Office follow-up next available  eSigned:  Beverley FiedlerJay M Pyrtle, MD 06/06/2015 2:06 PM   cc:  the patient, Dr. Orpah CobbAdvani

## 2015-06-06 NOTE — Progress Notes (Signed)
Called to room to assist during endoscopic procedure.  Patient ID and intended procedure confirmed with present staff. Received instructions for my participation in the procedure from the performing physician.  

## 2015-06-06 NOTE — Progress Notes (Signed)
To pacu vss patent aw report to rn 

## 2015-06-06 NOTE — Progress Notes (Signed)
Interpreter with pt during admission process(,Brenda Bell).

## 2015-06-07 ENCOUNTER — Telehealth: Payer: Self-pay | Admitting: *Deleted

## 2015-06-07 NOTE — Telephone Encounter (Signed)
No answer left message to call if questions or concernds.

## 2015-06-08 ENCOUNTER — Telehealth: Payer: Self-pay

## 2015-06-08 NOTE — Telephone Encounter (Signed)
Pt. Called back again. I told her that Urban GibsonMani was next door with appointments and would call her as soon as he had time. I tried to get more information about her call but she stated [facility] just [facility].   Best call back number is [phone]

## 2015-06-08 NOTE — Telephone Encounter (Signed)
Spoke with pt, I tried to get what she needed but it was hard to understand. [facility]. Please call around 4pm. Thanks

## 2015-06-09 NOTE — Telephone Encounter (Signed)
I called patient back and left message for her. I was unable to return her call yesterday but may be able to talk to her today. Please let me know as soon as she calls back. Thank you!

## 2015-06-10 ENCOUNTER — Encounter: Payer: Self-pay | Admitting: Internal Medicine

## 2015-07-19 ENCOUNTER — Encounter: Payer: Self-pay | Admitting: *Deleted

## 2015-08-02 ENCOUNTER — Ambulatory Visit: Payer: 59 | Admitting: Internal Medicine

## 2015-08-03 ENCOUNTER — Encounter: Payer: Self-pay | Admitting: Internal Medicine

## 2015-09-28 ENCOUNTER — Telehealth: Payer: Self-pay

## 2015-09-28 NOTE — Telephone Encounter (Signed)
[facility]. She was hard to understand.; all she told me is that she just needs to speak with him.

## 2015-10-07 NOTE — Telephone Encounter (Signed)
Patient would like to come in for follow up.

## 2015-12-18 ENCOUNTER — Ambulatory Visit (HOSPITAL_COMMUNITY)
Admission: EM | Admit: 2015-12-18 | Discharge: 2015-12-18 | Disposition: A | Discharge status: Home / Self Care | Payer: 59 | Attending: Family Medicine | Admitting: Family Medicine

## 2015-12-18 ENCOUNTER — Encounter (HOSPITAL_COMMUNITY): Payer: Self-pay | Admitting: Emergency Medicine

## 2015-12-18 DIAGNOSIS — Z833 Family history of diabetes mellitus: Secondary | ICD-10-CM | POA: Diagnosis not present

## 2015-12-18 DIAGNOSIS — Z8249 Family history of ischemic heart disease and other diseases of the circulatory system: Secondary | ICD-10-CM | POA: Diagnosis not present

## 2015-12-18 DIAGNOSIS — J069 Acute upper respiratory infection, unspecified: Secondary | ICD-10-CM | POA: Insufficient documentation

## 2015-12-18 DIAGNOSIS — E559 Vitamin D deficiency, unspecified: Secondary | ICD-10-CM | POA: Insufficient documentation

## 2015-12-18 DIAGNOSIS — E538 Deficiency of other specified B group vitamins: Secondary | ICD-10-CM | POA: Insufficient documentation

## 2015-12-18 LAB — POCT RAPID STREP A: Streptococcus, Group A Screen (Direct): NEGATIVE

## 2015-12-18 MED ORDER — HYDROCODONE-HOMATROPINE 5-1.5 MG/5ML PO SYRP: 5.0000 mL | ORAL_SOLUTION | Freq: Four times a day (QID) | ORAL | 0 refills | Status: AC | PRN

## 2015-12-18 NOTE — ED Provider Notes (Signed)
CSN: [id number]     Arrival date & time 12/18/15  1214 History   First MD Initiated Contact with Patient 12/18/15 1345     Chief Complaint  Patient presents with  . URI   (Consider location/radiation/quality/duration/timing/severity/associated sxs/prior Treatment) Brenda Bell is a well-appearing 45 y.o female, presents today for 4 days of coughing, headache, and sore throat. She also endorses some shortness of breath at time during her coughing, and had left ear pain yesterday but resolved. She also endorses congestion, sneezing, coughing, and myalgia. She have tried OTC cough syrup and OTC cold medicine with no relief.       Past Medical History:  Diagnosis Date  . Anemia   . B12 deficiency   . Gastritis   . Vitamin D deficiency    Past Surgical History:  Procedure Laterality Date  . BREAST SURGERY Left    cyst removed  . CESAREAN SECTION    . CHOLECYSTECTOMY    . KNEE SURGERY Right    tendon repair  . OVARY SURGERY Right    benign tumor of right ovary removed, when she was 45 years of age  . TONSILLECTOMY Bilateral    Family History  Problem Relation Age of Onset  . Hypertension Mother   . Diabetes Father    Social History  Substance Use Topics  . Smoking status: Never Smoker  . Smokeless tobacco: Never Used  . Alcohol use No   OB History    Gravida Para Term Preterm AB Living   3 3 3     3    SAB TAB Ectopic Multiple Live Births                 Review of Systems  Constitutional: Positive for fever. Negative for chills and fatigue.  HENT: Positive for congestion, ear pain, sneezing and sore throat. Negative for rhinorrhea and sinus pressure.   Eyes: Negative for pain, discharge and redness.  Respiratory: Positive for cough. Negative for shortness of breath and wheezing.   Cardiovascular: Negative for chest pain, palpitations and leg swelling.  Gastrointestinal: Negative for abdominal pain, diarrhea, nausea and vomiting.  Musculoskeletal: Positive for  myalgias.  Neurological: Positive for headaches. Negative for dizziness and weakness.    Allergies  Review of patient's allergies indicates no known allergies.  Home Medications   Prior to Admission medications   Medication Sig Start Date End Date Taking? Authorizing Provider  albuterol (PROVENTIL HFA;VENTOLIN HFA) 108 (90 BASE) MCG/ACT inhaler Inhale 1-2 puffs into the lungs every 6 (six) hours as needed for wheezing or shortness of breath. Patient not taking: Reported on 12/18/2015 11/22/14   Tonye Pearsonobert P Doolittle, MD  Fe Fum-FePoly-Vit C-Vit B3 (INTEGRA) 62.5-62.5-40-3 MG CAPS Take 1 capsule by mouth once daily 05/03/15   Beverley FiedlerJay M Pyrtle, MD  ferrous sulfate 325 (65 FE) MG tablet Take 1 tablet (325 mg total) by mouth 2 (two) times daily with a meal. Patient not taking: Reported on 12/18/2015 01/27/15   Wallis BambergMario Mani, PA-C  HYDROcodone-homatropine [facility](HYCODAN) 5-1.5 MG/5ML syrup Take 5 mLs by mouth every 6 (six) hours as needed for cough. 12/18/15 12/23/15  Lucia EstelleFeng Amarilys Lyles, NP  ibuprofen (ADVIL,MOTRIN) 200 MG tablet Take 200 mg by mouth every 6 (six) hours as needed.    Historical Provider, MD  senna-docusate (SENOKOT-S) 8.6-50 MG tablet Take 1 tablet by mouth 2 (two) times daily as needed for moderate constipation. Patient not taking: Reported on 12/18/2015 01/27/15   Wallis BambergMario Mani, PA-C  vitamin B-12 (CYANOCOBALAMIN) 1000 MCG tablet Take  1,000 mcg by mouth daily.    Historical Provider, MD  Vitamin D, Ergocalciferol, (DRISDOL) 50000 UNITS CAPS capsule Take 1 capsule (50,000 Units total) by mouth every 7 (seven) days. Patient not taking: Reported on 12/18/2015 02/03/15   Wallis Bamberg, PA-C   Meds Ordered and Administered this Visit  Medications - No data to display  BP 110/70 (BP Location: Left Arm)   Pulse 74   Temp 99.2 F (37.3 C) (Oral)   Resp 12   SpO2 100%  No data found.   Physical Exam  Constitutional: She is oriented to person, place, and time. She appears well-developed and well-nourished. No  distress.  HENT:  Head: Normocephalic and atraumatic.  Right Ear: External ear normal.  Left Ear: External ear normal.  Nose: Nose normal.  Mouth/Throat: Oropharynx is clear and moist. No oropharyngeal exudate.  Tonsils absent, left TM has a very mild erythema  Eyes: EOM are normal. Pupils are equal, round, and reactive to light.  Neck: Normal range of motion. Neck supple.  Tender to palpate over the left anterior cervical area  Cardiovascular: Normal rate, regular rhythm, normal heart sounds and intact distal pulses.   No murmur heard. Pulmonary/Chest: Effort normal and breath sounds normal.  Abdominal: Soft. Bowel sounds are normal. She exhibits no distension. There is no tenderness.  Musculoskeletal: Normal range of motion.  Neurological: She is alert and oriented to person, place, and time.  Skin: Skin is warm and dry. She is not diaphoretic.  Psychiatric: She has a normal mood and affect.  Nursing note and vitals reviewed.   Urgent Care Course   Clinical Course    Procedures (including critical care time)  Labs Review Labs Reviewed  POCT RAPID STREP A    Imaging Review No results found.    MDM   1. URI (upper respiratory infection)    The patient's signs and symptoms are most consistent with a diagnosis of URI. Patient educated that antibiotic would not be helpful since this is a viral illness. Treatment is supportive. Patient encouraged to rest, drink plenty of fluid, use Motrin/Tylenol as needed at appropriate dose for pain/fever. Rx for cough syrup given.     Lucia Estelle, NP 12/18/15 1402

## 2015-12-18 NOTE — ED Triage Notes (Signed)
Pt here for cold sx onset 4 days associated w/prod cough, HA, ST, fevers, chills  Taking OTC Robitussin for cough w/no relief.   A&O x4... NAD

## 2015-12-21 LAB — CULTURE, GROUP A STREP (THRC)

## 2016-09-01 ENCOUNTER — Ambulatory Visit (INDEPENDENT_AMBULATORY_CARE_PROVIDER_SITE_OTHER): Payer: Self-pay | Admitting: Physician Assistant

## 2016-09-01 ENCOUNTER — Encounter: Payer: Self-pay | Admitting: Physician Assistant

## 2016-09-01 VITALS — BP 111/68 | HR 73 | Temp 98.6°F | Resp 17 | Ht 65.5 in | Wt 187.0 lb

## 2016-09-01 DIAGNOSIS — M2142 Flat foot [pes planus] (acquired), left foot: Secondary | ICD-10-CM

## 2016-09-01 DIAGNOSIS — M79672 Pain in left foot: Secondary | ICD-10-CM

## 2016-09-01 DIAGNOSIS — M722 Plantar fascial fibromatosis: Secondary | ICD-10-CM

## 2016-09-01 DIAGNOSIS — M2141 Flat foot [pes planus] (acquired), right foot: Secondary | ICD-10-CM

## 2016-09-01 MED ORDER — MELOXICAM 7.5 MG PO TABS
7.5000 mg | ORAL_TABLET | Freq: Every day | ORAL | 0 refills | Status: DC
Start: 2016-09-01 — End: 2016-09-28

## 2016-09-01 NOTE — Patient Instructions (Addendum)
Plantar Fasciitis may take 6-12 months to get 100% better. Please see the following information to help you feel better. Please come back in a few months if you are still not better.   You can buy orthotic insoles at [facility] [facility] [address] [place], [place] [address] Phone: [phone]   ?Rest and icing may give initial pain relief. ?Wearing slippers or going barefoot may aggravate symptoms or may result in a recurrence of symptoms. Thus, the first step out of bed should be made wearing a supportive shoe or sandal. ?Performing of stretching exercises for the plantar fascia and calf muscles, which the patient can do at home. ?Avoiding the use of flat shoes and barefoot walking. ?Using prefabricated, over-the-counter, silicone heel shoe inserts (arch supports and/or heel cups). ?Decreasing physical activities that are suggested by the medical history to be causative or aggravating (eg, excessive running, dancing, or jumping). ?Prescribing or recommending a short-term trial (two to three weeks) of nonsteroidal antiinflammatory drugs (NSAIDs). Use of NSAIDs is reasonable, but their long-term use should be reserved for patients with known systemic rheumatic disease. ?Injecting the tender areas of the plantar region with glucocorticoids and a local anesthetic. ?Patients who work or reside in buildings with concrete floors should use cushion- or crepe-soled shoes. Excessive heel impact from jumping or during walking should be avoided. ?Home exercises (see attached paper) include plantar and calf-plantar fascia stretches, foot-ankle circles, toe curls, toe towel curls, and unilateral heel raises with toe dorsiflexion  ? Athletic shoes, arch-supporting shoes (particularly those with an extra-long counter, which is the firm part of the shoe that surrounds the heel), or shoes with rigid shanks (usually a metal insert into the sole of the shoe) may be helpful. Shoes with these features can  be found in stores featuring work shoes or "orthopedic shoes."   Fascitis plantar (Plantar Fasciitis) La fascitis plantar es una afeccin dolorosa que se produce en el taln. Ocurre cuando la banda de tejido que [employer] dedos con el hueso del taln (fascia plantar) se irrita. Esto puede ocurrir despus de Academic librarian ejercicio u otras actividades repetitivas (lesin por uso excesivo). El dolor de la fascitis plantar puede ser de leve (irritacin) a intenso, y en los casos ms agudos puede dificultar que la persona camine o se [place]. Por lo general, el dolor es peor a la maana o despus de Personal assistant sentado o acostado durante un perodo. CAUSAS Este trastorno puede ser causado por:  Estar de pie durante largos perodos.  Usar zapatos que no calcen bien.  Practicar actividades de alto impacto, como correr, o hacer ejercicios [employer] o ballet.  Tener sobrepeso.  Tener una forma de caminar (marcha) anormal.  Tener los msculos de la pantorrilla tensos.  Tener el arco alto en los pies.  Comenzar una nueva actividad fsica. SNTOMAS El sntoma principal de esta afeccin es el dolor en el taln. Otros sntomas pueden ser los siguientes:  Dolor que empeora despus de una actividad o un ejercicio.  Dolor ms intenso a la maana o despus de Lawyer.  Dolor que desaparece despus de caminar durante unos minutos. DIAGNSTICO Esta afeccin se puede diagnosticar en funcin de los signos y los sntomas. El mdico Location manager un examen fsico para controlar si tiene lo siguiente:  Un rea dolorida en la parte inferior del pie.  El arco alto.  Dolor al Doctor, general practice.  Dificultad para mover el pie. Tambin puede necesitar estudios por imgenes para Astronomer  el diagnstico. Estos pueden incluir los siguientes:  Radiografas.  Ecografa.  Resonancia magntica. TRATAMIENTO El tratamiento de la fascitis plantar depende de la gravedad de la afeccin. El tratamiento  puede incluir lo siguiente:  Reposo, hielo y analgsicos de venta libre para Human resources officercontrolar el dolor.  Ejercicios para estirar las pantorrillas y la fascia plantar.  Una frula que UGI Corporationmantiene el pie estirado y [place] arriba mientras usted duerme (frula nocturna).  Fisioterapia para Eastman Kodakaliviar los sntomas y Physiological scientistevitar problemas en el futuro.  Inyecciones de cortisona para Engineer, materialsaliviar el dolor intenso.  Tratamiento con ondas de choque extracorpreas para estimular con impulsos elctricos la fascia plantar lesionada. Esto suele usarse como un ltimo recurso antes de la [place].  Ciruga, si los otros tratamientos no han funcionado despus de 12meses. INSTRUCCIONES PARA EL CUIDADO EN EL HOGAR  Tome los medicamentos solamente como se lo haya indicado el mdico.  Evite las actividades que causan dolor.  Frote la parte inferior del pie sobre una bolsa de hielo o una botella de agua fra. Haga esto durante 20minutos, de 3a 4veces al C.H. Robinson Worldwideda.  Realice estiramientos simples como se lo haya indicado el mdico.  Trate de usar calzado deportivo con amortiguacin de aire o gel, o plantillas blandas.  Si el mdico se lo ha indicado, use una frula nocturna para dormir.  Cumpla con todas las visitas de control, segn le indique su mdico. PREVENCIN  No realice ejercicios ni actividades que le causen dolor en el taln.  Considere la posibilidad de Corporate investment bankerempezar actividades de bajo impacto si sigue teniendo problemas.  Pierda peso si lo necesita. La mejor forma de prevenir la fascitis plantar es evitar las actividades que lesionan ms la fascia plantar. SOLICITE ATENCIN MDICA SI:  Los sntomas no desaparecen despus del tratamiento en su casa.  El dolor Tiffinempeora.  El Artistdolor afecta la capacidad de moverse o de Education officer, environmentalrealizar las actividades diarias. Esta informacin no tiene Theme park managercomo fin reemplazar el consejo del mdico. Asegrese de hacerle al mdico cualquier pregunta que tenga. Document Released: 12/27/2004 Document  Revised: 07/11/2015 Document Reviewed: 01/27/2014 Elsevier Interactive Patient Education  2018 Elsevier Inc.  Fascitis plantar con rehabilitacin Plantar Fasciitis Rehab Consulte al mdico qu ejercicios son seguros para usted. Haga los ejercicios exactamente como se lo haya indicado el mdico y gradelos como se lo hayan indicado. Es normal sentir tirantez, tensin, presin o molestias leves mientras hace estos ejercicios, pero debe detenerse de inmediato si siente dolor repentino o si el dolor empeora. No comience a hacer estos ejercicios hasta que se lo indique el mdico. EJERCICIOS DE Pilgrim's PrideELONGACIN Y AMPLITUD DE MOVIMIENTOS Estos ejercicios calientan los msculos y las articulaciones, y mejoran la movilidad y la flexibilidad del pie. Adems, ayudan a Engineer, materialsaliviar el dolor. EjercicioA: Estiramiento de la fascia plantar 1. Sintese con la pierna izquierda/derecha cruzada sobre la rodilla opuesta. 2. Sostenga el taln con Edison Simonuna mano, con el pulgar cerca del arco. Con la otra mano, sostenga los dedos de los pies y empjelos con New Zealandcuidado hacia arriba. Debe sentir un estiramiento en la parte de abajo de los dedos o del pie, o de [place]ambos. 3. Mantenga esta posicin durante _________ segundos. 4. Afloje lentamente los dedos y vuelva a la posicin inicial. Repita __________ veces. Realice este ejercicio __________ veces al da. EjercicioB: Msculos gemelos, de pie 1. Prese con las UGI Corporationmanos apoyadas sobre la pared. 2. Extienda la pierna izquierda/derecha hacia atrs y flexione ligeramente la rodilla de la pierna de adelante. 3. Mantenga los talones en el suelo y la rodilla  de atrs extendida, y pase el peso hacia la pared, sin arquear la espalda. Debe sentir un estiramiento suave en la pantorrilla izquierda/derecha. 4. Mantenga esta posicin durante ___________ segundos. Repita __________ veces. Realice este ejercicio __________ veces al da. EjercicioC: Msculo sleo, de pie 1. Prese con las [employer] pared. 2. Extienda la pierna izquierda/derecha hacia atrs y flexione ligeramente la rodilla de la pierna de adelante. 3. Mantenga los talones apoyados en el suelo, flexione la rodilla de atrs y lleve el peso ligeramente a la pierna de atrs. Debe sentir un estiramiento suave en la parte profunda de la pantorrilla. 4. Mantenga esta posicin durante ___________ segundos. Repita __________ veces. Realice este ejercicio __________ veces al da. EjercicioD: Msculos gemelos y sleo, de pie 1. Prese sobre un escaln apoyando solo la regin metatarsiana de su pie derecho/izquierdo. La parte metatarsiana de la planta del pie es la superficie sobre la que caminamos, justo debajo de los dedos. 2. Mantenga el otro pie apoyado con firmeza en el mismo escaln. 3. Sostngase de la pared o de una baranda para mantener el equilibrio. 4. Levante lentamente el SCA[employer] Corporation, y permita que el peso del cuerpo presione el taln sobre el borde del escaln. Debe sentir un estiramiento en la pantorrilla izquierda/derecha. 5. Mantenga esta posicin durante ___________ segundos. 6. Vuelva a poner ambos pies sobre el escaln. 7. Repita este ejercicio con una leve flexin en la rodilla izquierda/derecha. Reptalo __________ veces con la rodilla izquierda/derecha extendida y __________ veces con la rodilla izquierda/derecha flexionada. Realice este ejercicio __________ veces al da. EJERCICIO DE EQUILIBRIO Este ejercicio aumenta el equilibrio y el control de la fuerza del arco, para ayudar a reducir la presin sobre la fascia plantar. EjercicioE: Pararse en una sola pierna 1. Sin calzado, prese cerca de una baranda o [place]. Puede sostenerse de la baranda o del marco de la puerta, segn lo necesite. 2. Prese sobre el pie izquierdo/derecho. Sin despegar el dedo gordo del suelo, intente mantener el arco levantado. No deje que el pie se vaya hacia adentro. 3. Mantenga esta posicin durante ___________  segundos. 4. Si este ejercicio es [address], puede intentar [employer] con los ojos cerrados o parado sobre [place]. Repita __________ veces. Realice este ejercicio __________ veces al da. Esta informacin no tiene Theme park manager el consejo del mdico. Asegrese de hacerle al mdico cualquier pregunta que tenga. Document Released: 01/03/2006 Document Revised: 08/03/2014 Document Reviewed: 01/31/2015 Elsevier Interactive Patient Education  2018 [employer].  IF you received an x-ray today, you will receive an invoice from [facility]. Please contact [facility] at [phone] with questions or concerns regarding your invoice.   IF you received labwork today, you will receive an invoice from [place]. Please contact [facility] at [phone] with questions or concerns regarding your invoice.   Our billing staff will not be able to assist you with questions regarding bills from these companies.  You will be contacted with the lab results as soon as they are available. The fastest way to get your results is to activate your My Chart account. Instructions are located on the last page of this paperwork. If you have not heard from [place] regarding the results in 2 weeks, please contact this office.

## 2016-09-01 NOTE — Progress Notes (Signed)
   Brenda Bell  MRN: [id number] DOB: June 20, 1970  PCP: Wallis BambergMani, Mario, PA-C  Subjective:  Pt is a 46 year old female who presents to clinic for left foot pain x 2 weeks. Her pain is located on the bottom of her left foot. Worse in the morning when she first gets out of bed. Pain is better as the day progresses. Pain does not radiate. No heel pain.  No MOI. This has never happened to her before. She does not wear shoes with proper foot support/orthotics.  Denies reduced ROM, red/hot joint, bony tenderness, swelling, fever, chills.   Review of Systems  Constitutional: Negative for chills and fever.  Musculoskeletal: Positive for arthralgias (left foot) and gait problem.  Skin: Negative.     Patient Active Problem List   Diagnosis Date Noted  . Anemia, iron deficiency 08/20/2013  . Periodic health assessment, general screening, adult 01/29/2013  . SKIN RASH 12/19/2009  . FURUNCLE 05/26/2009  . PHARYNGITIS, STREPTOCOCCAL 08/11/2008  . VIRAL URI 05/25/2008  . COSTOCHONDRITIS, LEFT 11/19/2007  . FATIGUE 08/12/2007  . ANEMIA, IRON DEFICIENCY 02/02/2007  . ALLERGIC RHINITIS 01/17/2007  . ASTHMA 12/30/2006  . GERD 12/30/2006  . 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 LUMP OR MASS IN BREAST 11/06/2005  . 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 ibuprofen (ADVIL,MOTRIN) 200 MG tablet Take 200 mg by mouth every 6 (six) hours as needed.    Marland Kitchen. albuterol (PROVENTIL HFA;VENTOLIN HFA) 108 (90 BASE) MCG/ACT inhaler Inhale 1-2 puffs into the lungs every 6 (six) hours as needed for wheezing or shortness of breath. (Patient not taking: Reported on 12/18/2015) 1 Inhaler 1   No current facility-administered medications on file prior to visit.     No Known Allergies   Objective:  BP 111/68   Pulse 73   Temp 98.6 F (37 C) (Oral)   Resp 17   Ht 5' 5.5" (1.664 m)   Wt 187 lb (84.8 kg)   LMP 08/11/2016 (Approximate)    SpO2 98%   BMI 30.65 kg/m   Physical Exam  Constitutional: She is oriented to person, place, and time and well-developed, well-nourished, and in no distress. No distress.  Cardiovascular: Normal rate, regular rhythm and normal heart sounds.   Musculoskeletal:  Pain with dorsiflexion and pressure applied from heel toward forefoot. No pain with palpation to posterior heel. No decreased ROM. Gait is normal. Pes planus.  Neurological: She is alert and oriented to person, place, and time. GCS score is 15.  Skin: Skin is warm and dry.  Psychiatric: Mood, memory, affect and judgment normal.  Vitals reviewed.   Assessment and Plan :  1. Plantar fasciitis 2. Left foot pain 3. Pes planus of both feet - meloxicam (MOBIC) 7.5 MG tablet; Take 1 tablet (7.5 mg total) by mouth daily. Max dose 15 mg/day  Dispense: 60 tablet; Refill: 0 - Aircast Air heel brace applied. Exercises and stretches discussed and printed out for patient. Encouraged ice baths, avoidance of flat shoes and daily exercises. RTC in 1-2 months if pain persists. She agrees with plan.   Marco CollieWhitney Rainee Sweatt, PA-C  [facility] at [facility] 09/01/2016 4:09 PM

## 2016-09-28 ENCOUNTER — Other Ambulatory Visit: Payer: Self-pay | Admitting: Physician Assistant

## 2016-09-28 DIAGNOSIS — M79672 Pain in left foot: Secondary | ICD-10-CM

## 2016-10-26 ENCOUNTER — Observation Stay (HOSPITAL_COMMUNITY)
Admission: EM | Admit: 2016-10-26 | Discharge: 2016-10-27 | Disposition: A | Discharge status: Home / Self Care | Payer: Self-pay | Attending: Surgery | Admitting: Surgery

## 2016-10-26 ENCOUNTER — Encounter (HOSPITAL_COMMUNITY)
Admission: EM | Disposition: A | Discharge status: Home / Self Care | Payer: Self-pay | Source: Home / Self Care | Attending: Emergency Medicine

## 2016-10-26 ENCOUNTER — Emergency Department (HOSPITAL_COMMUNITY): Payer: Self-pay

## 2016-10-26 ENCOUNTER — Observation Stay (HOSPITAL_COMMUNITY): Payer: Self-pay | Admitting: Certified Registered"

## 2016-10-26 ENCOUNTER — Encounter (HOSPITAL_COMMUNITY): Payer: Self-pay

## 2016-10-26 DIAGNOSIS — K353 Acute appendicitis with localized peritonitis, without perforation or gangrene: Secondary | ICD-10-CM

## 2016-10-26 DIAGNOSIS — E669 Obesity, unspecified: Secondary | ICD-10-CM | POA: Insufficient documentation

## 2016-10-26 DIAGNOSIS — Z6832 Body mass index (BMI) 32.0-32.9, adult: Secondary | ICD-10-CM | POA: Insufficient documentation

## 2016-10-26 DIAGNOSIS — K3580 Unspecified acute appendicitis: Secondary | ICD-10-CM | POA: Diagnosis present

## 2016-10-26 DIAGNOSIS — K37 Unspecified appendicitis: Secondary | ICD-10-CM | POA: Diagnosis present

## 2016-10-26 HISTORY — PX: LAPAROSCOPIC APPENDECTOMY: SHX408

## 2016-10-26 LAB — CBC
HCT: 33 % — ABNORMAL LOW (ref 36.0–46.0)
HEMOGLOBIN: 10.1 g/dL — AB (ref 12.0–15.0)
MCH: 24.2 pg — ABNORMAL LOW (ref 26.0–34.0)
MCHC: 30.6 g/dL (ref 30.0–36.0)
MCV: 78.9 fL (ref 78.0–100.0)
Platelets: 287 10*3/uL (ref 150–400)
RBC: 4.18 MIL/uL (ref 3.87–5.11)
RDW: 16.3 % — ABNORMAL HIGH (ref 11.5–15.5)
WBC: 7.8 10*3/uL (ref 4.0–10.5)

## 2016-10-26 LAB — COMPREHENSIVE METABOLIC PANEL
ALBUMIN: 3.6 g/dL (ref 3.5–5.0)
ALK PHOS: 56 U/L (ref 38–126)
ALT: 21 U/L (ref 14–54)
ANION GAP: 5 (ref 5–15)
AST: 31 U/L (ref 15–41)
BILIRUBIN TOTAL: 1 mg/dL (ref 0.3–1.2)
BUN: 6 mg/dL (ref 6–20)
CALCIUM: 8.4 mg/dL — AB (ref 8.9–10.3)
CO2: 24 mmol/L (ref 22–32)
Chloride: 106 mmol/L (ref 101–111)
Creatinine, Ser: 0.47 mg/dL (ref 0.44–1.00)
GLUCOSE: 111 mg/dL — AB (ref 65–99)
Potassium: 3.7 mmol/L (ref 3.5–5.1)
Sodium: 135 mmol/L (ref 135–145)
TOTAL PROTEIN: 6.9 g/dL (ref 6.5–8.1)

## 2016-10-26 LAB — URINALYSIS, ROUTINE W REFLEX MICROSCOPIC
BILIRUBIN URINE: NEGATIVE
Glucose, UA: NEGATIVE mg/dL
Hgb urine dipstick: NEGATIVE
KETONES UR: NEGATIVE mg/dL
Leukocytes, UA: NEGATIVE
NITRITE: NEGATIVE
Protein, ur: NEGATIVE mg/dL
SPECIFIC GRAVITY, URINE: 1.004 — AB (ref 1.005–1.030)
pH: 7 (ref 5.0–8.0)

## 2016-10-26 LAB — I-STAT BETA HCG BLOOD, ED (MC, WL, AP ONLY)

## 2016-10-26 LAB — HIV ANTIBODY (ROUTINE TESTING W REFLEX): HIV Screen 4th Generation wRfx: NONREACTIVE

## 2016-10-26 LAB — PREGNANCY, URINE: PREG TEST UR: NEGATIVE

## 2016-10-26 LAB — RPR: RPR Ser Ql: NONREACTIVE

## 2016-10-26 LAB — LIPASE, BLOOD: Lipase: 26 U/L (ref 11–51)

## 2016-10-26 SURGERY — APPENDECTOMY, LAPAROSCOPIC
Anesthesia: General | Site: Abdomen

## 2016-10-26 MED ORDER — BUPIVACAINE HCL 0.25 % IJ SOLN
INTRAMUSCULAR | Status: DC | PRN
  Administered 2016-10-26: 18 mL

## 2016-10-26 MED ORDER — SODIUM CHLORIDE 0.9 % IR SOLN
Status: DC | PRN
  Administered 2016-10-26: 1000 mL

## 2016-10-26 MED ORDER — DOCUSATE SODIUM 100 MG PO CAPS
100.0000 mg | ORAL_CAPSULE | Freq: Two times a day (BID) | ORAL | Status: DC
  Administered 2016-10-26: 100 mg via ORAL
  Filled 2016-10-26 (×2): qty 1

## 2016-10-26 MED ORDER — ONDANSETRON HCL 4 MG/2ML IJ SOLN: 4.0000 mg | Freq: Four times a day (QID) | INTRAMUSCULAR | Status: DC | PRN

## 2016-10-26 MED ORDER — ROCURONIUM BROMIDE 10 MG/ML (PF) SYRINGE
PREFILLED_SYRINGE | INTRAVENOUS | Status: DC | PRN
  Administered 2016-10-26: 25 mg via INTRAVENOUS
  Administered 2016-10-26 (×2): 10 mg via INTRAVENOUS

## 2016-10-26 MED ORDER — 0.9 % SODIUM CHLORIDE (POUR BTL) OPTIME
TOPICAL | Status: DC | PRN
  Administered 2016-10-26: 1000 mL

## 2016-10-26 MED ORDER — DEXTROSE 5 % IV SOLN
2.0000 g | INTRAVENOUS | Status: DC
  Administered 2016-10-27: 2 g via INTRAVENOUS
  Filled 2016-10-26: qty 2

## 2016-10-26 MED ORDER — PROPOFOL 10 MG/ML IV BOLUS
INTRAVENOUS | Status: AC
  Filled 2016-10-26: qty 20

## 2016-10-26 MED ORDER — HYDROMORPHONE HCL 1 MG/ML IJ SOLN
0.2500 mg | INTRAMUSCULAR | Status: DC | PRN
  Administered 2016-10-26 (×2): 0.5 mg via INTRAVENOUS

## 2016-10-26 MED ORDER — OXYCODONE HCL 5 MG PO TABS
5.0000 mg | ORAL_TABLET | ORAL | Status: DC | PRN
  Administered 2016-10-26 – 2016-10-27 (×2): 5 mg via ORAL
  Filled 2016-10-26 (×2): qty 1

## 2016-10-26 MED ORDER — ONDANSETRON HCL 4 MG/2ML IJ SOLN
4.0000 mg | Freq: Once | INTRAMUSCULAR | Status: AC
  Administered 2016-10-26: 4 mg via INTRAVENOUS
  Filled 2016-10-26: qty 2

## 2016-10-26 MED ORDER — PROPOFOL 10 MG/ML IV BOLUS
INTRAVENOUS | Status: DC | PRN
  Administered 2016-10-26: 170 mg via INTRAVENOUS

## 2016-10-26 MED ORDER — MORPHINE SULFATE (PF) 4 MG/ML IV SOLN
4.0000 mg | Freq: Once | INTRAVENOUS | Status: DC
  Filled 2016-10-26: qty 1

## 2016-10-26 MED ORDER — DEXTROSE 5 % IV SOLN
2.0000 g | Freq: Once | INTRAVENOUS | Status: AC
  Administered 2016-10-26: 2 g via INTRAVENOUS
  Filled 2016-10-26: qty 2

## 2016-10-26 MED ORDER — ALBUTEROL SULFATE (2.5 MG/3ML) 0.083% IN NEBU: 3.0000 mL | INHALATION_SOLUTION | Freq: Four times a day (QID) | RESPIRATORY_TRACT | Status: DC | PRN

## 2016-10-26 MED ORDER — SUGAMMADEX SODIUM 200 MG/2ML IV SOLN
INTRAVENOUS | Status: DC | PRN
  Administered 2016-10-26: 200 mg via INTRAVENOUS

## 2016-10-26 MED ORDER — LACTATED RINGERS IV SOLN
INTRAVENOUS | Status: DC | PRN
  Administered 2016-10-26 (×2): via INTRAVENOUS

## 2016-10-26 MED ORDER — ONDANSETRON HCL 4 MG/2ML IJ SOLN
INTRAMUSCULAR | Status: DC | PRN
  Administered 2016-10-26: 4 mg via INTRAVENOUS

## 2016-10-26 MED ORDER — SUGAMMADEX SODIUM 200 MG/2ML IV SOLN
INTRAVENOUS | Status: AC
  Filled 2016-10-26: qty 2

## 2016-10-26 MED ORDER — DEXAMETHASONE SODIUM PHOSPHATE 10 MG/ML IJ SOLN
INTRAMUSCULAR | Status: DC | PRN
  Administered 2016-10-26: 5 mg via INTRAVENOUS

## 2016-10-26 MED ORDER — FENTANYL CITRATE (PF) 100 MCG/2ML IJ SOLN
INTRAMUSCULAR | Status: DC | PRN
  Administered 2016-10-26 (×3): 50 ug via INTRAVENOUS
  Administered 2016-10-26: 100 ug via INTRAVENOUS

## 2016-10-26 MED ORDER — MIDAZOLAM HCL 2 MG/2ML IJ SOLN
INTRAMUSCULAR | Status: AC
  Filled 2016-10-26: qty 2

## 2016-10-26 MED ORDER — IBUPROFEN 200 MG PO TABS
200.0000 mg | ORAL_TABLET | Freq: Four times a day (QID) | ORAL | Status: DC | PRN
  Administered 2016-10-27: 200 mg via ORAL
  Filled 2016-10-26: qty 1

## 2016-10-26 MED ORDER — OXYCODONE HCL 5 MG PO TABS: 5.0000 mg | ORAL_TABLET | Freq: Once | ORAL | Status: DC | PRN

## 2016-10-26 MED ORDER — HYDROMORPHONE HCL 1 MG/ML IJ SOLN
INTRAMUSCULAR | Status: AC
  Administered 2016-10-26: 0.5 mg via INTRAVENOUS
  Filled 2016-10-26: qty 1

## 2016-10-26 MED ORDER — IOPAMIDOL (ISOVUE-300) INJECTION 61%
INTRAVENOUS | Status: AC
  Administered 2016-10-26: 100 mL
  Filled 2016-10-26: qty 100

## 2016-10-26 MED ORDER — DEXAMETHASONE SODIUM PHOSPHATE 10 MG/ML IJ SOLN
INTRAMUSCULAR | Status: AC
  Filled 2016-10-26: qty 1

## 2016-10-26 MED ORDER — FENTANYL CITRATE (PF) 250 MCG/5ML IJ SOLN
INTRAMUSCULAR | Status: AC
  Filled 2016-10-26: qty 5

## 2016-10-26 MED ORDER — SODIUM CHLORIDE 0.9 % IV BOLUS (SEPSIS)
1000.0000 mL | Freq: Once | INTRAVENOUS | Status: AC
  Administered 2016-10-26: 1000 mL via INTRAVENOUS

## 2016-10-26 MED ORDER — DIPHENHYDRAMINE HCL 50 MG/ML IJ SOLN: 25.0000 mg | Freq: Four times a day (QID) | INTRAMUSCULAR | Status: DC | PRN

## 2016-10-26 MED ORDER — ACETAMINOPHEN 325 MG PO TABS
650.0000 mg | ORAL_TABLET | Freq: Four times a day (QID) | ORAL | Status: DC | PRN
  Administered 2016-10-26: 650 mg via ORAL
  Filled 2016-10-26: qty 2

## 2016-10-26 MED ORDER — ONDANSETRON 4 MG PO TBDP
4.0000 mg | ORAL_TABLET | Freq: Four times a day (QID) | ORAL | Status: DC | PRN
  Administered 2016-10-26: 4 mg via ORAL
  Filled 2016-10-26: qty 1

## 2016-10-26 MED ORDER — LIDOCAINE 2% (20 MG/ML) 5 ML SYRINGE
INTRAMUSCULAR | Status: DC | PRN
  Administered 2016-10-26: 60 mg via INTRAVENOUS

## 2016-10-26 MED ORDER — POTASSIUM CHLORIDE IN NACL 20-0.9 MEQ/L-% IV SOLN
INTRAVENOUS | Status: DC
  Administered 2016-10-26 – 2016-10-27 (×2): via INTRAVENOUS
  Filled 2016-10-26 (×2): qty 1000

## 2016-10-26 MED ORDER — HYDROMORPHONE HCL 1 MG/ML IJ SOLN
0.5000 mg | INTRAMUSCULAR | Status: DC | PRN
  Administered 2016-10-26 (×2): 1 mg via INTRAVENOUS
  Filled 2016-10-26 (×2): qty 1

## 2016-10-26 MED ORDER — METRONIDAZOLE IN NACL 5-0.79 MG/ML-% IV SOLN
500.0000 mg | Freq: Once | INTRAVENOUS | Status: AC
  Administered 2016-10-26: 500 mg via INTRAVENOUS
  Filled 2016-10-26: qty 100

## 2016-10-26 MED ORDER — ALUM & MAG HYDROXIDE-SIMETH 200-200-20 MG/5ML PO SUSP
15.0000 mL | Freq: Four times a day (QID) | ORAL | Status: DC | PRN
  Administered 2016-10-26 – 2016-10-27 (×2): 15 mL via ORAL
  Filled 2016-10-26 (×2): qty 30

## 2016-10-26 MED ORDER — MIDAZOLAM HCL 5 MG/5ML IJ SOLN
INTRAMUSCULAR | Status: DC | PRN
  Administered 2016-10-26: 2 mg via INTRAVENOUS

## 2016-10-26 MED ORDER — DIPHENHYDRAMINE HCL 25 MG PO CAPS: 25.0000 mg | ORAL_CAPSULE | Freq: Four times a day (QID) | ORAL | Status: DC | PRN

## 2016-10-26 MED ORDER — OXYCODONE HCL 5 MG/5ML PO SOLN: 5.0000 mg | Freq: Once | ORAL | Status: DC | PRN

## 2016-10-26 MED ORDER — METRONIDAZOLE IN NACL 5-0.79 MG/ML-% IV SOLN
500.0000 mg | Freq: Three times a day (TID) | INTRAVENOUS | Status: DC
  Administered 2016-10-26 – 2016-10-27 (×2): 500 mg via INTRAVENOUS
  Filled 2016-10-26 (×3): qty 100

## 2016-10-26 MED ORDER — FENTANYL CITRATE (PF) 100 MCG/2ML IJ SOLN
50.0000 ug | Freq: Once | INTRAMUSCULAR | Status: AC
  Administered 2016-10-26: 50 ug via INTRAVENOUS
  Filled 2016-10-26: qty 2

## 2016-10-26 MED ORDER — SUCCINYLCHOLINE CHLORIDE 200 MG/10ML IV SOSY
PREFILLED_SYRINGE | INTRAVENOUS | Status: DC | PRN
  Administered 2016-10-26: 100 mg via INTRAVENOUS

## 2016-10-26 MED ORDER — ONDANSETRON HCL 4 MG/2ML IJ SOLN
INTRAMUSCULAR | Status: AC
  Filled 2016-10-26: qty 2

## 2016-10-26 MED ORDER — BUPIVACAINE-EPINEPHRINE (PF) 0.25% -1:200000 IJ SOLN
INTRAMUSCULAR | Status: AC
  Filled 2016-10-26: qty 30

## 2016-10-26 SURGICAL SUPPLY — 46 items
BAG RETRIEVAL 10 (BASKET) ×1
BAG RETRIEVAL 10MM (BASKET) ×1
CANISTER SUCT 3000ML PPV (MISCELLANEOUS) ×3 IMPLANT
CHLORAPREP W/TINT 26ML (MISCELLANEOUS) ×3 IMPLANT
COVER SURGICAL LIGHT HANDLE (MISCELLANEOUS) ×3 IMPLANT
CUTTER ENDO LINEAR 45M (STAPLE) ×3 IMPLANT
DERMABOND ADVANCED (GAUZE/BANDAGES/DRESSINGS) ×2
DERMABOND ADVANCED .7 DNX12 (GAUZE/BANDAGES/DRESSINGS) ×1 IMPLANT
DEVICE PMI PUNCTURE CLOSURE (MISCELLANEOUS) ×3 IMPLANT
ELECT REM PT RETURN 9FT ADLT (ELECTROSURGICAL) ×3
ELECTRODE REM PT RTRN 9FT ADLT (ELECTROSURGICAL) ×1 IMPLANT
GLOVE BIO SURGEON STRL SZ 6 (GLOVE) ×3 IMPLANT
GLOVE BIOGEL PI IND STRL 6.5 (GLOVE) ×2 IMPLANT
GLOVE BIOGEL PI IND STRL 8 (GLOVE) ×1 IMPLANT
GLOVE BIOGEL PI INDICATOR 6.5 (GLOVE) ×4
GLOVE BIOGEL PI INDICATOR 8 (GLOVE) ×2
GLOVE ECLIPSE 7.5 STRL STRAW (GLOVE) ×3 IMPLANT
GLOVE SURG SS PI 6.5 STRL IVOR (GLOVE) ×3 IMPLANT
GOWN STRL REUS W/ TWL LRG LVL3 (GOWN DISPOSABLE) ×2 IMPLANT
GOWN STRL REUS W/ TWL XL LVL3 (GOWN DISPOSABLE) ×1 IMPLANT
GOWN STRL REUS W/TWL LRG LVL3 (GOWN DISPOSABLE) ×4
GOWN STRL REUS W/TWL XL LVL3 (GOWN DISPOSABLE) ×2
KIT BASIN OR (CUSTOM PROCEDURE TRAY) ×3 IMPLANT
KIT ROOM TURNOVER OR (KITS) ×3 IMPLANT
NEEDLE INSUFFLATION 14GA 120MM (NEEDLE) ×3 IMPLANT
NS IRRIG 1000ML POUR BTL (IV SOLUTION) ×3 IMPLANT
PAD ARMBOARD 7.5X6 YLW CONV (MISCELLANEOUS) ×3 IMPLANT
RELOAD 45 VASCULAR/THIN (ENDOMECHANICALS) IMPLANT
RELOAD STAPLE TA45 3.5 REG BLU (ENDOMECHANICALS) ×3 IMPLANT
RELOAD STAPLER BLUE 60MM (STAPLE) ×1 IMPLANT
SCISSORS LAP 5X35 DISP (ENDOMECHANICALS) ×3 IMPLANT
SET IRRIG TUBING LAPAROSCOPIC (IRRIGATION / IRRIGATOR) ×3 IMPLANT
SHEARS HARMONIC ACE PLUS 36CM (ENDOMECHANICALS) ×3 IMPLANT
SLEEVE ENDOPATH XCEL 5M (ENDOMECHANICALS) ×3 IMPLANT
SPECIMEN JAR SMALL (MISCELLANEOUS) ×3 IMPLANT
STAPLER RELOAD BLUE 60MM (STAPLE) ×3
STAPLER STANDARD HANDLE (STAPLE) ×3 IMPLANT
SUT MNCRL AB 4-0 PS2 18 (SUTURE) ×3 IMPLANT
SYS BAG RETRIEVAL 10MM (BASKET) ×1
SYSTEM BAG RETRIEVAL 10MM (BASKET) ×1 IMPLANT
TOWEL OR 17X24 6PK STRL BLUE (TOWEL DISPOSABLE) ×3 IMPLANT
TRAY FOLEY CATH SILVER 16FR (SET/KITS/TRAYS/PACK) ×3 IMPLANT
TRAY LAPAROSCOPIC MC (CUSTOM PROCEDURE TRAY) ×3 IMPLANT
TROCAR BLADELESS 12MM (ENDOMECHANICALS) ×3 IMPLANT
TROCAR XCEL NON-BLD 5MMX100MML (ENDOMECHANICALS) ×3 IMPLANT
TUBING INSUFFLATION (TUBING) ×3 IMPLANT

## 2016-10-26 NOTE — ED Notes (Signed)
Dr Connor at bedside 

## 2016-10-26 NOTE — Anesthesia Postprocedure Evaluation (Signed)
Anesthesia Post Note  Patient: Brenda Bell  Procedure(s) Performed: Procedure(s) (LRB): APPENDECTOMY LAPAROSCOPIC (N/A)     Patient location during evaluation: PACU Anesthesia Type: General Level of consciousness: awake and alert and patient cooperative Pain management: pain level controlled Vital Signs Assessment: post-procedure vital signs reviewed and stable Respiratory status: spontaneous breathing and respiratory function stable Cardiovascular status: stable Anesthetic complications: no    Last Vitals:  Vitals:   10/26/16 1725 10/26/16 1754  BP: 112/77 116/67  Pulse: 84 83  Resp: 19 16  Temp: 36.5 C 37.8 C    Last Pain:  Vitals:   10/26/16 1812  TempSrc:   PainSc: 7                  Aeriana Speece S

## 2016-10-26 NOTE — Anesthesia Procedure Notes (Signed)
Procedure Name: Intubation Date/Time: 10/26/2016 2:49 PM Performed by: Melina Copa, Jaclynn Laumann R Pre-anesthesia Checklist: Patient identified, Emergency Drugs available, Suction available and Patient being monitored Patient Re-evaluated:Patient Re-evaluated prior to induction Oxygen Delivery Method: Circle System Utilized Preoxygenation: Pre-oxygenation with 100% oxygen Induction Type: IV induction and Rapid sequence Laryngoscope Size: Mac and 3 Grade View: Grade II Tube type: Oral Tube size: 7.0 mm Number of attempts: 1 Airway Equipment and Method: Stylet Placement Confirmation: ETT inserted through vocal cords under direct vision,  positive ETCO2 and breath sounds checked- equal and bilateral Secured at: 21 cm Tube secured with: Tape Dental Injury: Teeth and Oropharynx as per pre-operative assessment

## 2016-10-26 NOTE — Anesthesia Preprocedure Evaluation (Signed)
Anesthesia Evaluation  Patient identified by MRN, date of birth, ID band Patient awake    Reviewed: Allergy & Precautions, H&P , NPO status , Patient's Chart, lab work & pertinent test results  Airway Mallampati: II   Neck ROM: full    Dental   Pulmonary neg pulmonary ROS,    breath sounds clear to auscultation       Cardiovascular negative cardio ROS   Rhythm:regular Rate:Normal     Neuro/Psych    GI/Hepatic GERD  ,  Endo/Other  obese  Renal/GU      Musculoskeletal   Abdominal   Peds  Hematology  (+) anemia ,   Anesthesia Other Findings   Reproductive/Obstetrics                             Anesthesia Physical Anesthesia Plan  ASA: II  Anesthesia Plan: General   Post-op Pain Management:    Induction: Intravenous  PONV Risk Score and Plan: 4 or greater and Ondansetron, Dexamethasone, Propofol, Midazolam, Scopolamine patch - Pre-op and Treatment may vary due to age or medical condition  Airway Management Planned: Oral ETT  Additional Equipment:   Intra-op Plan:   Post-operative Plan: Extubation in OR  Informed Consent: I have reviewed the patients History and Physical, chart, labs and discussed the procedure including the risks, benefits and alternatives for the proposed anesthesia with the patient or authorized representative who has indicated his/her understanding and acceptance.     Plan Discussed with: CRNA, Anesthesiologist and Surgeon  Anesthesia Plan Comments:         Anesthesia Quick Evaluation

## 2016-10-26 NOTE — Op Note (Signed)
Operative Report  Brenda BristleMaria A Spike 46 y.o. female  [id number]  [id number]  10/26/2016  Surgeon: Berna Buehelsea A Fannie Gathright   Assistant: none  Procedure performed: Laparoscopic Appendectomy  Preop diagnosis: Acute nonperforated appendicitis  Post-op diagnosis/intraop findings: Acute appendicitis  Specimens: appendix  EBL: minimal  Complications: none  Description of procedure: After obtaining informed consent the patient was brought to the operating room. Antibiotics and subcutaneous heparin were administered. SCD's were applied. General endotracheal anesthesia was initiated and a formal time-out was performed. The abdomen was prepped and draped in the usual sterile fashion and the abdomen was entered using an visiport technique in the left upper quadrant and insufflated to 15 mmHg. The abdomen was inspected and there is no evidence of injury from our entry. A suprapubic 5 mm trocar and a left lower quadrant 12 mm trocar were introduced under direct visualization following infiltration with local. The patient was then placed in Trendelenburg and rotated to the left and the small bowel was reflected cephalad. The appendix was visualized: it was very dilated, woody and inflamed, There was a small amount of murky fluid in the pelvis and rlq but no frank perforation or purulence. The ileal sail and appendiceal mesentery were densely adherent to the appendix and there was induration of the cecum at the appendiceal base. Great care was taken to ensure no injury to surrounding retroperitoneal structures, cecum or terminal ileum. The harmonic scalpel was used to divide the appendiceal mesentery at the base of the appendix to skeletonize it. A blue load linear cutting stapler was used to transect the appendix from the cecum. Hemostasis was ensured. The staple line was closely inspected given the inflammation of the cecal base; it was found to be intact and hemostatic.The appendix was placed in an Endo Catch  bag and removed through our 12 mm trocar. The abdomen was irrigated and the effluent was clear. The 12mm trocar site in the left lower quadrant was closed with a 0 vicryl in the fascia under direct visualization using a PMI device. The abdomen was desufflated and all trocars removed. The skin incisions were closed with running subcuticular monocryl and Dermabond. The patient was awakened, extubated and transported to the recovery room in stable condition.   All counts were correct at the completion of the case.

## 2016-10-26 NOTE — ED Triage Notes (Signed)
Pt states that for the past three days she has been having RLQ abd pain, some nausea yesterday. Denies fevers. Denies diarrhea, some constipation, last BM today.

## 2016-10-26 NOTE — ED Provider Notes (Signed)
[facility]-EMERGENCY DEPT Provider Note   CSN: [id number] Arrival date & time: 10/26/16  0303     History   Chief Complaint Chief Complaint  Patient presents with  . Abdominal Pain    HPI Brenda Bell is a 46 y.o. female.  Brenda Bell is a [age] y.o. Female who presents to the ED complaining of gradual onset of right lower quadrant abdominal pain beginning 2 days ago. Patient reports her symptoms began gradually 2 days ago with epigastric and right lower quadrant abdominal pain. She reports associated nausea but no vomiting or diarrhea. Previous abdominal surgical history includes 3 cesarean sections and a tumor removal off of her ovary when she was a teenager. She reports she is sexually active with one female partner. She reports using protection. She denies vaginal bleeding or vaginal discharge. No treatments attempted prior to arrival today. She denies fevers, urinary symptoms, vomiting, diarrhea, rashes, vaginal bleeding or vaginal discharge or cough. Last menstrual cycle was 3 weeks ago.   The history is provided by the patient and medical records. No language interpreter was used.  Abdominal Pain   Associated symptoms include nausea. Pertinent negatives include fever, diarrhea, vomiting, dysuria, frequency, hematuria and headaches.    Past Medical History:  Diagnosis Date  . Anemia   . B12 deficiency   . Gastritis   . Vitamin D deficiency     Patient Active Problem List   Diagnosis Date Noted  . Acute appendicitis 10/26/2016  . Anemia, iron deficiency 08/20/2013  . Periodic health assessment, general screening, adult 01/29/2013  . SKIN RASH 12/19/2009  . FURUNCLE 05/26/2009  . PHARYNGITIS, STREPTOCOCCAL 08/11/2008  . VIRAL URI 05/25/2008  . COSTOCHONDRITIS, LEFT 11/19/2007  . FATIGUE 08/12/2007  . ANEMIA, IRON DEFICIENCY 02/02/2007  . ALLERGIC RHINITIS 01/17/2007  . ASTHMA 12/30/2006  . GERD 12/30/2006  . 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 LUMP OR MASS IN BREAST  11/06/2005  . 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 BREAST SURGERY Left    cyst removed  . CESAREAN SECTION    . CHOLECYSTECTOMY    . KNEE SURGERY Right    tendon repair  . 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 TONSILLECTOMY Bilateral     OB History    Gravida Para Term Preterm AB Living   3 3 3     3    SAB TAB Ectopic Multiple Live Births                   Home Medications    Prior to Admission medications   Medication Sig Start Date End Date Taking? Authorizing Provider  ibuprofen (ADVIL,MOTRIN) 200 MG tablet Take 200 mg by mouth every 6 (six) hours as needed.   Yes [provider]  albuterol (PROVENTIL HFA;VENTOLIN HFA) 108 (90 BASE) MCG/ACT inhaler Inhale 1-2 puffs into the lungs every 6 (six) hours as needed for wheezing or shortness of breath. Patient not taking: Reported on 12/18/2015 11/22/14   Tonye Pearsonoolittle, Robert P, MD  meloxicam (MOBIC) 7.5 MG tablet TAKE 1 TABLET (7.5 MG TOTAL) BY MOUTH DAILY. MAX DOSE 15 MG/DAY Patient not taking: Reported on 10/26/2016 09/28/16   McVey, Madelaine BhatElizabeth Whitney, PA-C    Family History Family History  Problem Relation Age of Onset  . Hypertension Mother   . Diabetes Father     Social History Social History  Substance Use  Topics  . Smoking status: Never Smoker  . Smokeless tobacco: Never Used  . Alcohol use No     Allergies   Patient has no known allergies.   Review of Systems Review of Systems  Constitutional: Negative for chills and fever.  HENT: Negative for congestion and sore throat.   Eyes: Negative for visual disturbance.  Respiratory: Negative for cough and shortness of breath.   Cardiovascular: Negative for chest pain.  Gastrointestinal: Positive for abdominal pain and nausea. Negative for diarrhea and vomiting.  Genitourinary: Negative for difficulty urinating, dysuria, flank pain, frequency, hematuria,  menstrual problem, urgency, vaginal bleeding and vaginal discharge.  Musculoskeletal: Negative for back pain and neck pain.  Skin: Negative for rash.  Neurological: Negative for headaches.     Physical Exam Updated Vital Signs BP 116/70   Pulse 84   Temp 98.9 F (37.2 C) (Oral)   Resp 18   SpO2 99%   Physical Exam  Constitutional: She appears well-developed and well-nourished. No distress.  Nontoxic appearing.  HENT:  Head: Normocephalic and atraumatic.  Mouth/Throat: Oropharynx is clear and moist.  Eyes: Pupils are equal, round, and reactive to light. Conjunctivae are normal. Right eye exhibits no discharge. Left eye exhibits no discharge.  Neck: Neck supple.  Cardiovascular: Normal rate, regular rhythm, normal heart sounds and intact distal pulses.  Exam reveals no gallop and no friction rub.   No murmur heard. Pulmonary/Chest: Effort normal and breath sounds normal. No respiratory distress. She has no wheezes. She has no rales.  Abdominal: Soft. Bowel sounds are normal. She exhibits no distension and no mass. There is tenderness. There is guarding. There is no rebound.  Abdomen is soft. Bowel sounds are present. Patient has moderate right lower quadrant abdominal tenderness to palpation with some mild guarding. No Rovsing sign.  Musculoskeletal: She exhibits no edema.  Lymphadenopathy:    She has no cervical adenopathy.  Neurological: She is alert. Coordination normal.  Skin: Skin is warm and dry. Capillary refill takes less than 2 seconds. No rash noted. She is not diaphoretic. No erythema. No pallor.  Psychiatric: She has a normal mood and affect. Her behavior is normal.  Nursing note and vitals reviewed.    ED Treatments / Results  Labs (all labs ordered are listed, but only abnormal results are displayed) Labs Reviewed  COMPREHENSIVE METABOLIC PANEL - Abnormal; Notable for the following:       Result Value   Glucose, Bld 111 (*)    Calcium 8.4 (*)    All other  components within normal limits  CBC - Abnormal; Notable for the following:    Hemoglobin 10.1 (*)    HCT 33.0 (*)    MCH 24.2 (*)    RDW 16.3 (*)    All other components within normal limits  URINALYSIS, ROUTINE W REFLEX MICROSCOPIC - Abnormal; Notable for the following:    Color, Urine STRAW (*)    Specific Gravity, Urine 1.004 (*)    All other components within normal limits  LIPASE, BLOOD  PREGNANCY, URINE  RPR  HIV ANTIBODY (ROUTINE TESTING)  I-STAT BETA HCG BLOOD, ED ([facility], [facility], [facility] ONLY)    EKG  EKG Interpretation None       Radiology Ct Abdomen Pelvis W Contrast  Result Date: 10/26/2016 CLINICAL DATA:  Right lower quadrant abdominal pain, nausea EXAM: CT ABDOMEN AND PELVIS WITH CONTRAST TECHNIQUE: Multidetector CT imaging of the abdomen and pelvis was performed using the standard protocol following bolus administration of intravenous contrast.  CONTRAST:  ISOVUE-300 IOPAMIDOL (ISOVUE-300) INJECTION 61% COMPARISON:  None FINDINGS: Lower chest: Lung bases are clear. No effusions. Heart is normal size. Hepatobiliary: No focal hepatic abnormality.  Prior cholecystectomy Pancreas: No focal abnormality or ductal dilatation. Spleen: No focal abnormality.  Normal size. Adrenals/Urinary Tract: No adrenal abnormality. No focal renal abnormality. No stones or hydronephrosis. Urinary bladder is unremarkable. Stomach/Bowel: The appendix is dilated, measuring 17 mm in diameter with mucosal enhancement and significant surrounding inflammation. Findings compatible with acute appendicitis. Appendicolith layering at the tip of the appendix. Stomach, large and small bowel unremarkable. No evidence of bowel obstruction. Vascular/Lymphatic: No evidence of aneurysm or adenopathy. Reproductive: Small enhancing nodule in the region of the endometrium, best seen on sagittal image 62 measures 12 mm. No adnexal masses Other: Small amount of free fluid in the pelvis and right lower quadrant. No free air.  Musculoskeletal: No acute bony abnormality. IMPRESSION: Dilated, inflamed appendix compatible with acute appendicitis. Small amount of free fluid in the pelvis and right lower quadrant. 12 mm enhancing nodule in the endometrium. This could represent endometrial polyp or submucosal fibroid. Recommend further evaluation with elective pelvic ultrasound. Electronically Signed   By: Charlett Nose M.D.   On: 10/26/2016 09:27    Procedures Procedures (including critical care time)  Medications Ordered in ED Medications  morphine 4 MG/ML injection 4 mg (4 mg Intravenous Not Given 10/26/16 0750)  cefTRIAXone (ROCEPHIN) 2 g in dextrose 5 % 50 mL IVPB (2 g Intravenous New Bag/Given 10/26/16 1042)    And  metroNIDAZOLE (FLAGYL) IVPB 500 mg (not administered)  0.9 % NaCl with KCl 20 mEq/ L  infusion (not administered)  ondansetron (ZOFRAN-ODT) disintegrating tablet 4 mg (not administered)    Or  ondansetron (ZOFRAN) injection 4 mg (not administered)  diphenhydrAMINE (BENADRYL) capsule 25 mg (not administered)    Or  diphenhydrAMINE (BENADRYL) injection 25 mg (not administered)  HYDROmorphone (DILAUDID) injection 0.5-1 mg (not administered)  sodium chloride 0.9 % bolus 1,000 mL (0 mLs Intravenous Stopped 10/26/16 0859)  ondansetron (ZOFRAN) injection 4 mg (4 mg Intravenous Given 10/26/16 0744)  iopamidol (ISOVUE-300) 61 % injection (100 mLs  Contrast Given 10/26/16 0903)  fentaNYL (SUBLIMAZE) injection 50 mcg (50 mcg Intravenous Given 10/26/16 1039)     Initial Impression / Assessment and Plan / ED Course  I have reviewed the triage vital signs and the nursing notes.  Pertinent labs & imaging results that were available during my care of the patient were reviewed by me and considered in my medical decision making (see chart for details).    This is a 46 y.o. Female who presents to the ED complaining of gradual onset of right lower quadrant abdominal pain beginning 2 days ago. Patient reports her symptoms  began gradually 2 days ago with epigastric and right lower quadrant abdominal pain. She reports associated nausea but no vomiting or diarrhea. Previous abdominal surgical history includes 3 cesarean sections and a tumor removal off of her ovary when she was a teenager. On exam the patient is afebrile and nontoxic-appearing. She has moderate right lower quadrant tenderness to palpation with some guarding. Pregnancy test is negative. Urinalysis is without signs of infection. Lipase is within normal limits. His CMP is unremarkable. CBC shows no leukocytosis. CT abdomen and pelvis was obtained which showed acute appendicitis.  I consulted with general surgery PA Will who would like [place] to start Rocephin and Flagyl. He will by down to see the patient. Plan for surgery.  Patient agrees with  plan for general surgery to see.   Patient admitted by general surgery. Plan for surgery.     Final Clinical Impressions(s) / ED Diagnoses   Final diagnoses:  Acute appendicitis with localized peritonitis    New Prescriptions New Prescriptions   No medications on file     Everlene Farrier, Cordelia Poche 10/26/16 1104    Margarita Grizzle, MD 10/26/16 [phone]

## 2016-10-26 NOTE — Transfer of Care (Signed)
Immediate Anesthesia Transfer of Care Note  Patient: Brenda Bell  Procedure(s) Performed: Procedure(s): APPENDECTOMY LAPAROSCOPIC (N/A)  Patient Location: PACU  Anesthesia Type:General  Level of Consciousness: awake, oriented and patient cooperative  Airway & Oxygen Therapy: Patient Spontanous Breathing and Patient connected to nasal cannula oxygen  Post-op Assessment: Report given to RN, Post -op Vital signs reviewed and stable and Patient moving all extremities  Post vital signs: Reviewed and stable  Last Vitals:  Vitals:   10/26/16 1145 10/26/16 1230  BP: 112/80 107/71  Pulse: 84 83  Resp:    Temp:      Last Pain:  Vitals:   10/26/16 1249  TempSrc:   PainSc: 9          Complications: No apparent anesthesia complications

## 2016-10-26 NOTE — H&P (Signed)
Brenda Bell is an 46 y.o. female.   Chief Complaint: RLQ pain HPI: 45 year old female who presents to the ED with a 3 day history of right lower quadrant pain. Pain started mid epigastric, but has migrated to RLQ.   She had some nausea yesterday and the pain became much more severe.  Nothing made it better, she has not been hungry and living on soup only for the last couple days. She denied fever, no diarrhea, some constipation but had a BM earlier in the day.  Workup in the emergency department shows she is afebrile. Vital signs are stable. Labs show a normal CMP. WBC is 7.8 hemoglobin 10, hematocrit 33 platelets are 287,000. CT scan shows the appendix is dilated measuring 17 mm in diameter with mucosal enhancement and significant surrounding inflammation. Findings compatible with acute appendicitis. There is an appendicolith Lane at the tip of the appendix. The stomach and large and small bowel are normal. We are asked to see.  She speaks some [place], the Nursing staff and son are helping with language issues.     Past Medical History:  Diagnosis Date  . Anemia   . B12 deficiency   . Gastritis   . Vitamin D deficiency     Past Surgical History:  Procedure Laterality Date  . BREAST SURGERY Left    cyst removed  . CESAREAN SECTION    . CHOLECYSTECTOMY    . KNEE SURGERY Right    tendon repair  . OVARY SURGERY  Large 2 kg tumor as a child Right    benign tumor of right ovary removed, when she was 46 years of age  . TONSILLECTOMY Bilateral     Family History  Problem Relation Age of Onset  . Hypertension Mother   . Diabetes Father    Social History:  reports that she has never smoked. She has never used smokeless tobacco. She reports that she does not drink alcohol or use drugs.   ETOH:  None DRUGS:  None Tobacco:  None    Allergies: No Known Allergies   Prior to Admission medications   Medication Sig Start Date End Date Taking? Authorizing Provider  ibuprofen  (ADVIL,MOTRIN) 200 MG tablet Take 200 mg by mouth every 6 (six) hours as needed.   Yes [provider]  albuterol (PROVENTIL HFA;VENTOLIN HFA) 108 (90 BASE) MCG/ACT inhaler Inhale 1-2 puffs into the lungs every 6 (six) hours as needed for wheezing or shortness of breath. Patient not taking: Reported on 12/18/2015 11/22/14   Leandrew Koyanagi, MD  meloxicam (MOBIC) 7.5 MG tablet TAKE 1 TABLET (7.5 MG TOTAL) BY MOUTH DAILY. MAX DOSE 15 MG/DAY Patient not taking: Reported on 10/26/2016 09/28/16   McVey, Gelene Mink, PA-C     Results for orders placed or performed during the hospital encounter of 10/26/16 (from the past 48 hour(s))  Lipase, blood     Status: None   Collection Time: 10/26/16  3:18 AM  Result Value Ref Range   Lipase 26 11 - 51 U/L  Comprehensive metabolic panel     Status: Abnormal   Collection Time: 10/26/16  3:18 AM  Result Value Ref Range   Sodium 135 135 - 145 mmol/L   Potassium 3.7 3.5 - 5.1 mmol/L   Chloride 106 101 - 111 mmol/L   CO2 24 22 - 32 mmol/L   Glucose, Bld 111 (H) 65 - 99 mg/dL   BUN 6 6 - 20 mg/dL   Creatinine, Ser 0.47 0.44 - 1.00  mg/dL   Calcium 8.4 (L) 8.9 - 10.3 mg/dL   Total Protein 6.9 6.5 - 8.1 g/dL   Albumin 3.6 3.5 - 5.0 g/dL   AST 31 15 - 41 U/L   ALT 21 14 - 54 U/L   Alkaline Phosphatase 56 38 - 126 U/L   Total Bilirubin 1.0 0.3 - 1.2 mg/dL   GFR calc non Af Amer >60 >60 mL/min   GFR calc Af Amer >60 >60 mL/min    Comment: (NOTE) The eGFR has been calculated using the CKD EPI equation. This calculation has not been validated in all clinical situations. eGFR's persistently <60 mL/min signify possible Chronic Kidney Disease.    Anion gap 5 5 - 15  CBC     Status: Abnormal   Collection Time: 10/26/16  3:18 AM  Result Value Ref Range   WBC 7.8 4.0 - 10.5 K/uL   RBC 4.18 3.87 - 5.11 MIL/uL   Hemoglobin 10.1 (L) 12.0 - 15.0 g/dL   HCT 33.0 (L) 36.0 - 46.0 %   MCV 78.9 78.0 - 100.0 fL   MCH 24.2 (L) 26.0 - 34.0 pg   MCHC  30.6 30.0 - 36.0 g/dL   RDW 16.3 (H) 11.5 - 15.5 %   Platelets 287 150 - 400 K/uL  Urinalysis, Routine w reflex microscopic     Status: Abnormal   Collection Time: 10/26/16  3:30 AM  Result Value Ref Range   Color, Urine STRAW (A) YELLOW   APPearance CLEAR CLEAR   Specific Gravity, Urine 1.004 (L) 1.005 - 1.030   pH 7.0 5.0 - 8.0   Glucose, UA NEGATIVE NEGATIVE mg/dL   Hgb urine dipstick NEGATIVE NEGATIVE   Bilirubin Urine NEGATIVE NEGATIVE   Ketones, ur NEGATIVE NEGATIVE mg/dL   Protein, ur NEGATIVE NEGATIVE mg/dL   Nitrite NEGATIVE NEGATIVE   Leukocytes, UA NEGATIVE NEGATIVE  I-Stat beta hCG blood, ED     Status: None   Collection Time: 10/26/16  6:53 AM  Result Value Ref Range   I-stat hCG, quantitative <5.0 <5 mIU/mL   Comment 3            Comment:   GEST. AGE      CONC.  (mIU/mL)   <=1 WEEK        5 - 50     2 WEEKS       50 - 500     3 WEEKS       100 - 10,000     4 WEEKS     1,000 - 30,000        FEMALE AND NON-PREGNANT FEMALE:     LESS THAN 5 mIU/mL    Ct Abdomen Pelvis W Contrast  Result Date: 10/26/2016 CLINICAL DATA:  Right lower quadrant abdominal pain, nausea EXAM: CT ABDOMEN AND PELVIS WITH CONTRAST TECHNIQUE: Multidetector CT imaging of the abdomen and pelvis was performed using the standard protocol following bolus administration of intravenous contrast. CONTRAST:  177m ISOVUE-300 IOPAMIDOL (ISOVUE-300) INJECTION 61% COMPARISON:  None FINDINGS: Lower chest: Lung bases are clear. No effusions. Heart is normal size. Hepatobiliary: No focal hepatic abnormality.  Prior cholecystectomy Pancreas: No focal abnormality or ductal dilatation. Spleen: No focal abnormality.  Normal size. Adrenals/Urinary Tract: No adrenal abnormality. No focal renal abnormality. No stones or hydronephrosis. Urinary bladder is unremarkable. Stomach/Bowel: The appendix is dilated, measuring 17 mm in diameter with mucosal enhancement and significant surrounding inflammation. Findings compatible with  acute appendicitis. Appendicolith layering at the tip of the appendix.  Stomach, large and small bowel unremarkable. No evidence of bowel obstruction. Vascular/Lymphatic: No evidence of aneurysm or adenopathy. Reproductive: Small enhancing nodule in the region of the endometrium, best seen on sagittal image 62 measures 12 mm. No adnexal masses Other: Small amount of free fluid in the pelvis and right lower quadrant. No free air. Musculoskeletal: No acute bony abnormality. IMPRESSION: Dilated, inflamed appendix compatible with acute appendicitis. Small amount of free fluid in the pelvis and right lower quadrant. 12 mm enhancing nodule in the endometrium. This could represent endometrial polyp or submucosal fibroid. Recommend further evaluation with elective pelvic ultrasound. Electronically Signed   By: Rolm Baptise M.D.   On: 10/26/2016 09:27    Review of Systems  Constitutional: Positive for chills. Negative for diaphoresis, fever, malaise/fatigue and weight loss.       Not hungry taking just soup last 24 hours or more.  HENT: Negative.   Eyes: Negative.   Respiratory: Positive for cough (sounds chronic). Negative for hemoptysis, sputum production, shortness of breath and wheezing.   Cardiovascular: Negative.   Gastrointestinal: Positive for abdominal pain (Pain started mid abdomen over the last 3 days now down in RLQ) and nausea (yesterday). Negative for blood in stool, constipation, diarrhea, heartburn, melena and vomiting.  Genitourinary: Negative.   Musculoskeletal: Positive for joint pain (knees).  Skin: Negative.   Neurological: Negative.  Negative for weakness.  Endo/Heme/Allergies: Negative.   Psychiatric/Behavioral: Negative.     Blood pressure 116/70, pulse 84, temperature 98.9 F (37.2 C), temperature source Oral, resp. rate 18, SpO2 99 %. Physical Exam  Constitutional: She is oriented to person, place, and time. She appears well-developed and well-nourished. No distress.  HENT:   Head: Normocephalic and atraumatic.  Mouth/Throat: No oropharyngeal exudate.  Eyes: Right eye exhibits no discharge. Left eye exhibits no discharge. No scleral icterus.  Pupils are equal  Neck: Normal range of motion. Neck supple. No JVD present. No tracheal deviation present. No thyromegaly present.  Cardiovascular: Normal rate, regular rhythm, normal heart sounds and intact distal pulses.   No murmur heard. Respiratory: Effort normal and breath sounds normal. No respiratory distress. She has no wheezes. She has no rales. She exhibits no tenderness.  GI: Soft. She exhibits no distension and no mass. There is tenderness (very tender RLQ). There is no rebound and no guarding.  Mid line scar below the umbilicus  Musculoskeletal: She exhibits no edema or tenderness.  Lymphadenopathy:    She has no cervical adenopathy.  Neurological: She is alert and oriented to person, place, and time. No cranial nerve deficit. Coordination normal.  Skin: Skin is warm and dry. No rash noted. She is not diaphoretic. No erythema. No pallor.  Psychiatric: She has a normal mood and affect. Her behavior is normal. Judgment and thought content normal.     Assessment/Plan Acute appendicitis History of C-section 3 and tumor removal off 1 ovary as a teen   Dylan Ruotolo, PA-C 10/26/2016, 10:10 AM

## 2016-10-27 ENCOUNTER — Encounter (HOSPITAL_COMMUNITY): Payer: Self-pay | Admitting: Surgery

## 2016-10-27 LAB — BASIC METABOLIC PANEL
ANION GAP: 6 (ref 5–15)
BUN: 9 mg/dL (ref 6–20)
CALCIUM: 7.9 mg/dL — AB (ref 8.9–10.3)
CHLORIDE: 106 mmol/L (ref 101–111)
CO2: 23 mmol/L (ref 22–32)
Creatinine, Ser: 0.59 mg/dL (ref 0.44–1.00)
GFR calc non Af Amer: >60 mL/min (ref >60)
Glucose, Bld: 167 mg/dL — ABNORMAL HIGH (ref 65–99)
Potassium: 3.6 mmol/L (ref 3.5–5.1)
SODIUM: 135 mmol/L (ref 135–145)

## 2016-10-27 LAB — MAGNESIUM: MAGNESIUM: 2 mg/dL (ref 1.7–2.4)

## 2016-10-27 LAB — CBC
HEMATOCRIT: 35.9 % — AB (ref 36.0–46.0)
HEMOGLOBIN: 11 g/dL — AB (ref 12.0–15.0)
MCH: 24.2 pg — ABNORMAL LOW (ref 26.0–34.0)
MCHC: 30.6 g/dL (ref 30.0–36.0)
MCV: 79.1 fL (ref 78.0–100.0)
Platelets: 275 10*3/uL (ref 150–400)
RBC: 4.54 MIL/uL (ref 3.87–5.11)
RDW: 16.2 % — ABNORMAL HIGH (ref 11.5–15.5)
WBC: 15.4 10*3/uL — AB (ref 4.0–10.5)

## 2016-10-27 LAB — GLUCOSE, CAPILLARY: Glucose-Capillary: 131 mg/dL — ABNORMAL HIGH (ref 65–99)

## 2016-10-27 MED ORDER — OXYCODONE HCL 5 MG PO TABS: 5.0000 mg | ORAL_TABLET | ORAL | Status: DC | PRN

## 2016-10-27 MED ORDER — ACETAMINOPHEN 325 MG PO TABS: 650.0000 mg | ORAL_TABLET | Freq: Four times a day (QID) | ORAL | Status: DC | PRN

## 2016-10-27 MED ORDER — IBUPROFEN 200 MG PO TABS: 200.0000 mg | ORAL_TABLET | Freq: Four times a day (QID) | ORAL | Status: DC | PRN

## 2016-10-27 MED ORDER — OXYCODONE HCL 5 MG PO TABS
5.0000 mg | ORAL_TABLET | ORAL | Status: DC | PRN
  Administered 2016-10-27: 5 mg via ORAL
  Filled 2016-10-27: qty 1

## 2016-10-27 MED ORDER — IBUPROFEN 200 MG PO TABS: ORAL_TABLET | ORAL | 0 refills | Status: DC

## 2016-10-27 MED ORDER — IBUPROFEN 200 MG PO TABS
200.0000 mg | ORAL_TABLET | Freq: Four times a day (QID) | ORAL | Status: DC | PRN
  Administered 2016-10-27: 200 mg via ORAL
  Filled 2016-10-27: qty 1

## 2016-10-27 MED ORDER — OXYCODONE HCL 5 MG PO TABS: 5.0000 mg | ORAL_TABLET | ORAL | 0 refills | Status: DC | PRN

## 2016-10-27 NOTE — Progress Notes (Signed)
Pt for discharge going home prescription given, removed the peripheral IV line, health teachings given, all personal belongings given, next appointment, no s/s of distress noted, husband at the bedside.

## 2016-10-27 NOTE — Discharge Instructions (Signed)
[facility] ______[facility] °LAPAROSCOPIC SURGERY: POST OP INSTRUCTIONS °Always review your discharge instruction sheet given to you by the facility where your surgery was performed. °IF YOU HAVE DISABILITY OR FAMILY LEAVE FORMS, YOU MUST BRING THEM TO THE OFFICE FOR PROCESSING.   °DO NOT GIVE THEM TO YOUR DOCTOR. ° °1. A prescription for pain medication may be given to you upon discharge.  Take your pain medication as prescribed, if needed.  If narcotic pain medicine is not needed, then you may take acetaminophen (Tylenol) or ibuprofen (Advil) as needed. °2. Take your usually prescribed medications unless otherwise directed. °3. If you need a refill on your pain medication, please contact your pharmacy.  They will contact our office to request authorization. Prescriptions will not be filled after 5pm or on week-ends. °4. You should follow a light diet the first few days after arrival home, such as soup and crackers, etc.  Be sure to include lots of fluids daily. °5. Most patients will experience some swelling and bruising in the area of the incisions.  Ice packs will help.  Swelling and bruising can take several days to resolve.  °6. It is common to experience some constipation if taking pain medication after surgery.  Increasing fluid intake and taking a stool softener (such as Colace) will usually help or prevent this problem from occurring.  A mild laxative (Milk of Magnesia or Miralax) should be taken according to package instructions if there are no bowel movements after 48 hours. °7. Unless discharge instructions indicate otherwise, you may remove your bandages 24-48 hours after surgery, and you may shower at that time.  You may have steri-strips (small skin tapes) in place directly over the incision.  These strips should be left on the skin for 7-10 days.  If your surgeon used skin glue on the incision, you may shower in 24 hours.  The glue will flake off over the next 2-3 weeks.  Any sutures or  staples will be removed at the office during your follow-up visit. °8. ACTIVITIES:  You may resume regular (light) daily activities beginning the next day--such as daily self-care, walking, climbing stairs--gradually increasing activities as tolerated.  You may have sexual intercourse when it is comfortable.  Refrain from any heavy lifting or straining until approved by your doctor. °a. You may drive when you are no longer taking prescription pain medication, you can comfortably wear a seatbelt, and you can safely maneuver your car and apply brakes. °b. RETURN TO WORK:  __________________________________________________________ °9. You should see your doctor in the office for a follow-up appointment approximately 2-3 weeks after your surgery.  Make sure that you call for this appointment within a day or two after you arrive home to insure a convenient appointment time. °10. OTHER INSTRUCTIONS: __________________________________________________________________________________________________________________________ __________________________________________________________________________________________________________________________ °WHEN TO CALL YOUR DOCTOR: °1. Fever over 101.0 °2. Inability to urinate °3. Continued bleeding from incision. °4. Increased pain, redness, or drainage from the incision. °5. Increasing abdominal pain ° °The clinic staff is available to answer your questions during regular business hours.  Please don’t hesitate to call and ask to speak to one of the nurses for clinical concerns.  If you have a medical emergency, go to the nearest emergency room or call 911.  A surgeon from [facility] is always on call at the hospital. °[address], [address], [place], [place]  [address] ? [address] [address], [place],    [address] °[phone] ? [phone] ? FAX [phone] °Web site:   [email or web address]    Apendicectoma laparoscpica en adultos, cuidados  posteriores (Laparoscopic Appendectomy, Adult, Care After) Estas indicaciones le proporcionan informacin acerca de cmo deber cuidarse despus del procedimiento. El mdico tambin podr darle instrucciones especficas. Comunquese con el mdico si tiene algn problema o tiene preguntas despus del procedimiento. CUIDADOS EN EL HOGAR Medicamentos  Baxter Internationalome los medicamentos de venta libre y los recetados solamente como se lo haya indicado el mdico.  No conduzca durante 24horas si le administraron un sedante.  No conduzca ni use maquinaria pesada mientras toma analgsicos recetados.  Si le recetaron un antibitico, tmelo como se lo haya indicado el mdico. No deje de tomarlo aunque comience a sentirse mejor. Actividad  No levante ningn objeto que pese ms de 10libras (4,5kg) durante 3semanas o como se lo haya indicado el mdico.  No practique deportes de contacto durante 3semanas o como se lo haya indicado el mdico.  Retome gradualmente sus actividades habituales. El Campbell Soupbao  Mantenga los cortes de la ciruga (incisiones) limpios y secos. ? United AutoLave los cortes suavemente con agua y [place]jabn. ? Enjuguelos con agua hasta eliminar todo el jabn. ? Seque bien los cortes con una toalla limpia dando golpecitos. No frote United Stationerssobre los cortes.  Puede tomar una ducha despus de 48horas.  No tome baos de inmersin, no practique natacin ni use el jacuzzi durante 2semanas o segn las indicaciones del mdico. Cuidado de los cortes  Siga las indicaciones el mdico en lo que respecta al cuidado de los cortes. Haga lo siguiente: ? Lvese las manos con agua y jabn antes de Multimedia programmercambiar las vendas (vendaje). Use un desinfectante para manos si no dispone de [place] y [place]n. ? Cambie el vendaje como se lo haya indicado el mdico. ? No retire los puntos (suturas), el QUALCOMMadhesivo para la piel o las tiras Williamsburgadhesivas. Tal vez deban dejarse puestos en la piel durante 2semanas o ms tiempo. Si las tiras Bellevueadhesivas se  despegan y se enroscan, puede recortar los bordes sueltos. No retire las tiras Agilent Technologiesadhesivas por completo a menos que el mdico lo autorice.  Revsese los cortes CarMaxtodos los das para detectar signos de infeccin. Controle lo siguiente: ? Aumento del enrojecimiento, la hinchazn o Chief Technology Officerel dolor. ? Ms lquido [place]col[place] Janskyo sangre. ? Calor. ? Pus o mal olor. Otras indicaciones  Si lo enviaron a su casa con un drenaje, siga las instrucciones del mdico en lo que respecta al uso y al cuidado.  Haga respiraciones profundas. Esto evita que se hinchen (inflamen) los pulmones.  Para prevenir el estreimiento: ? Beba abundantes lquidos. ? Coma mucha fruta y verdura fresca.  Concurra a todas las visitas de control como se lo haya indicado el mdico. Esto es importante. SOLICITE AYUDA SI:  Aumenta el enrojecimiento, la hinchazn o el dolor en la zona de la ciruga.  Observa ms lquido, sangre o pus que sale de uno de los cortes.  El corte est caliente al tacto.  Tiene pus o un olor ftido que provienen de uno de los cortes o de un vendaje.  Se abren los bordes de uno de los cortes despus de que se retiraron los puntos.  Tiene dolor en los hombros que Little Valleyempeora.  Se siente mareado o se desvanece (se desmaya).  Le falta el aire.  Siente malestar estomacal (nuseas) constante.  No deja de vomitar.  Tiene diarrea o no puede controlar la defecacin.  Pierde el apetito.  Presenta dolor o hinchazn en las piernas. SOLICITE AYUDA DE INMEDIATO SI:  Tiene fiebre.  Aparece Neomia Dearuna  erupcin cutnea.  Tiene dificultad para respirar.  Siente dolores fuertes Avayaen el pecho. Esta informacin no tiene Theme park managercomo fin reemplazar el consejo del mdico. Asegrese de hacerle al mdico cualquier pregunta que tenga. Document Released: 11/16/2010 Document Revised: 07/11/2015 Document Reviewed: 09/06/2014 Elsevier Interactive Patient Education  2018 [employer]

## 2016-10-27 NOTE — Progress Notes (Signed)
1 Day Post-Op    CC:  Acute appendicitis  Subjective: Doing pretty well this a.m. has only had clears. She is able to walk and void. She's also had a bowel movement.  Objective: Vital signs in last 24 hours: Temp:  [97.6 F (36.4 C)-100.8 F (38.2 C)] 98.4 F (36.9 C) (07/28 1021) Pulse Rate:  [82-98] 84 (07/28 1021) Resp:  [16-20] 19 (07/28 1021) BP: (107-124)/(64-80) 110/71 (07/28 1021) SpO2:  [95 %-100 %] 98 % (07/28 1021) Weight:  [84.4 kg (186 lb)] 84.4 kg (186 lb) (07/27 1315)  2445 IV 1700 urine Tm 100.1 1500 afebrile since, VSS WBC is up some  Intake/Output from previous day: 07/27 0701 - 07/28 0700 In: 3695.8 [I.V.:2445.8; IV Piggyback:1250] Out: 1750 [Urine:1700; Blood:50] Intake/Output this shift: No intake/output data recorded.  General appearance: alert, cooperative and no distress Resp: clear to auscultation bilaterally GI: Soft, sore, sites all look good.  Lab Results:   Recent Labs  10/26/16 0318 10/27/16 0552  WBC 7.8 15.4*  HGB 10.1* 11.0*  HCT 33.0* 35.9*  PLT 287 275    BMET  Recent Labs  10/26/16 0318 10/27/16 0552  NA 135 135  K 3.7 3.6  CL 106 106  CO2 24 23  GLUCOSE 111* 167*  BUN 6 9  CREATININE 0.47 0.59  CALCIUM 8.4* 7.9*   PT/INR No results for input(s): LABPROT, INR in the last 72 hours.   Recent Labs Lab 10/26/16 0318  AST 31  ALT 21  ALKPHOS 56  BILITOT 1.0  PROT 6.9  ALBUMIN 3.6     Lipase     Component Value Date/Time   LIPASE 26 10/26/2016 0318     Medications: . 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 Twana Firsthelsea Conner FEN:  Iv fluids/clear ID: Rocephin day 2 DVT:  SCD   Plan: Mobilize, regular diet, home later today if she tolerates diet and pain medications adequate.   LOS: 0 days    Sherrie GeorgeJENNINGS,Mcadoo Muzquiz 10/27/2016 [phone]

## 2016-10-30 NOTE — Discharge Summary (Signed)
Physician Discharge Summary  Patient ID: Brenda Bell MRN: [id number] DOB/AGE: 1970-11-17 46 y.o.  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 Gwenlyn Fudgehelsea Conner    Hospital Course:  46 year old female who presents to the ED with a 3 day history of right lower quadrant pain. Pain started mid epigastric, but has migrated to RLQ.   She had some nausea yesterday and the pain became much more severe.  Nothing made it better, she has not been hungry and living on soup only for the last couple days. She denied fever, no diarrhea, some constipation but had a BM earlier in the day.  Workup in the emergency department shows she is afebrile. Vital signs are stable. Labs show a normal CMP. WBC is 7.8 hemoglobin 10, hematocrit 33 platelets are 287,000. CT scan shows the appendix is dilated measuring 17 mm in diameter with mucosal enhancement and significant surrounding inflammation. Findings compatible with acute appendicitis. There is an appendicolith Lane at the tip of the appendix. The stomach and large and small bowel are normal. We are asked to see.  She speaks some [place], the Nursing staff and son are helping with language issues  She was admitted by Dr. Fredricka Bonineonnor and taken to the OR later that day.  She did well and was ready for discharge home the following AM.   Condition on d/c:  Improved    CBC Latest Ref Rng & Units 10/27/2016 10/26/2016 05/03/2015  WBC 4.0 - 10.5 K/uL 15.4(H) 7.8 3.3(L)  Hemoglobin 12.0 - 15.0 g/dL 11.0(L) 10.1(L) 12.6  Hematocrit 36.0 - 46.0 % 35.9(L) 33.0(L) 38.3  Platelets 150 - 400 K/uL 275 287 219.0   CMP Latest Ref Rng & Units 10/27/2016 10/26/2016 02/03/2015  Glucose 65 - 99 mg/dL 657(Q167(H) 469(G111(H) -  BUN 6 -  20 mg/dL 9 6 -  Creatinine 2.950.44 - 1.00 mg/dL 2.840.59 1.320.47 -  Sodium 440135 - 145 mmol/L 135 135 -  Potassium 3.5 - 5.1 mmol/L 3.6 3.7 -  Chloride 101 - 111 mmol/L 106 106 -  CO2 22 - 32 mmol/L 23 24 -  Calcium 8.9 - 10.3 mg/dL 7.9(L) 8.4(L) 9.4  Total Protein 6.5 - 8.1 g/dL - 6.9 -  Total Bilirubin 0.3 - 1.2 mg/dL - 1.0 -  Alkaline Phos 38 - 126 U/L - 56 -  AST 15 - 41 U/L - 31 -  ALT 14 - 54 U/L - 21 -   Disposition: 01-Home or Self Care   Allergies as of 10/27/2016   No Known Allergies     Medication List    STOP taking these medications   meloxicam 7.5 MG tablet Commonly known as:  MOBIC     TAKE these medications   acetaminophen 325 MG tablet Commonly known as:  TYLENOL Take 2 tablets (650 mg total) by mouth every 6 (six) hours as needed for mild pain, moderate pain or headache. Notes to patient:  If needed for mild pain   albuterol 108 (90 Base) MCG/ACT inhaler Commonly known as:  PROVENTIL HFA;VENTOLIN HFA Inhale 1-2 puffs into the lungs every 6 (six) hours as needed for wheezing or shortness of breath. Notes to patient:  If needed for wheezing   ibuprofen 200 MG tablet Commonly known as:  ADVIL,MOTRIN You can take  2 tablets every 4 hours as needed for pain.  Follow package instructions. What changed:  how much to take  how to take this  when to take this  reasons to take this  additional instructions Notes to patient:  If needed for pain   oxyCODONE 5 MG immediate release tablet Commonly known as:  Oxy IR/ROXICODONE Take 1 tablet (5 mg total) by mouth every 4 (four) hours as needed for moderate pain, severe pain or breakthrough pain. Notes to patient:  If needed for pain      Follow-up Information    [facility]   Specialty:  General Surgery Why:  The office should call you Monday with an appointment for the [facility] clinic.  You need to be there 30 minutes before your appointment for check in.  Bring your photo ID and insurance  information. Contact information: [address] [place] [address] [phone]           Signed: Sherrie GeorgeJENNINGS,Akeila Lana 10/30/2016, 2:27 PM

## 2016-11-04 ENCOUNTER — Emergency Department (HOSPITAL_COMMUNITY)
Admission: EM | Admit: 2016-11-04 | Discharge: 2016-11-05 | Disposition: A | Discharge status: Home / Self Care | Payer: Self-pay | Attending: Emergency Medicine | Admitting: Emergency Medicine

## 2016-11-04 ENCOUNTER — Emergency Department (HOSPITAL_COMMUNITY): Payer: Self-pay

## 2016-11-04 ENCOUNTER — Encounter (HOSPITAL_COMMUNITY): Payer: Self-pay | Admitting: Emergency Medicine

## 2016-11-04 DIAGNOSIS — J45909 Unspecified asthma, uncomplicated: Secondary | ICD-10-CM | POA: Insufficient documentation

## 2016-11-04 DIAGNOSIS — G8918 Other acute postprocedural pain: Secondary | ICD-10-CM | POA: Insufficient documentation

## 2016-11-04 DIAGNOSIS — R109 Unspecified abdominal pain: Secondary | ICD-10-CM | POA: Insufficient documentation

## 2016-11-04 DIAGNOSIS — R059 Cough, unspecified: Secondary | ICD-10-CM

## 2016-11-04 DIAGNOSIS — R05 Cough: Secondary | ICD-10-CM | POA: Insufficient documentation

## 2016-11-04 LAB — URINALYSIS, ROUTINE W REFLEX MICROSCOPIC
BILIRUBIN URINE: NEGATIVE
Glucose, UA: NEGATIVE mg/dL
HGB URINE DIPSTICK: NEGATIVE
KETONES UR: NEGATIVE mg/dL
Leukocytes, UA: NEGATIVE
Nitrite: NEGATIVE
PROTEIN: NEGATIVE mg/dL
Specific Gravity, Urine: <1.005 — ABNORMAL LOW (ref 1.005–1.030)
pH: 6 (ref 5.0–8.0)

## 2016-11-04 LAB — COMPREHENSIVE METABOLIC PANEL
ALBUMIN: 3.4 g/dL — AB (ref 3.5–5.0)
ALT: 14 U/L (ref 14–54)
AST: 17 U/L (ref 15–41)
Alkaline Phosphatase: 60 U/L (ref 38–126)
Anion gap: 4 — ABNORMAL LOW (ref 5–15)
BILIRUBIN TOTAL: 0.4 mg/dL (ref 0.3–1.2)
BUN: 9 mg/dL (ref 6–20)
CHLORIDE: 109 mmol/L (ref 101–111)
CO2: 23 mmol/L (ref 22–32)
CREATININE: 0.58 mg/dL (ref 0.44–1.00)
Calcium: 8.5 mg/dL — ABNORMAL LOW (ref 8.9–10.3)
GFR calc Af Amer: >60 mL/min (ref >60)
Glucose, Bld: 100 mg/dL — ABNORMAL HIGH (ref 65–99)
Potassium: 3.7 mmol/L (ref 3.5–5.1)
Sodium: 136 mmol/L (ref 135–145)
Total Protein: 7 g/dL (ref 6.5–8.1)

## 2016-11-04 LAB — CBC WITH DIFFERENTIAL/PLATELET
Basophils Absolute: 0 10*3/uL (ref 0.0–0.1)
Basophils Relative: 0 %
EOS ABS: 0.2 10*3/uL (ref 0.0–0.7)
EOS PCT: 4 %
HCT: 32 % — ABNORMAL LOW (ref 36.0–46.0)
Hemoglobin: 9.7 g/dL — ABNORMAL LOW (ref 12.0–15.0)
LYMPHS ABS: 2.3 10*3/uL (ref 0.7–4.0)
Lymphocytes Relative: 42 %
MCH: 23.6 pg — AB (ref 26.0–34.0)
MCHC: 30.3 g/dL (ref 30.0–36.0)
MCV: 77.9 fL — ABNORMAL LOW (ref 78.0–100.0)
Monocytes Absolute: 0.7 10*3/uL (ref 0.1–1.0)
Monocytes Relative: 12 %
Neutro Abs: 2.3 10*3/uL (ref 1.7–7.7)
Neutrophils Relative %: 42 %
PLATELETS: 340 10*3/uL (ref 150–400)
RBC: 4.11 MIL/uL (ref 3.87–5.11)
RDW: 16.2 % — AB (ref 11.5–15.5)
WBC: 5.6 10*3/uL (ref 4.0–10.5)

## 2016-11-04 LAB — I-STAT CG4 LACTIC ACID, ED: Lactic Acid, Venous: 0.9 mmol/L (ref 0.5–1.9)

## 2016-11-04 NOTE — ED Notes (Signed)
Labs reviewed.

## 2016-11-04 NOTE — ED Triage Notes (Signed)
Pt reports an appendectomy last week and ever since she has had a dry cough.  She reports abdominal pain when she coughs at one of the incision sites however site appears to be healing nicely, cool to touch, no drainage or redness.

## 2016-11-05 MED ORDER — BENZONATATE 100 MG PO CAPS: 100.0000 mg | ORAL_CAPSULE | Freq: Three times a day (TID) | ORAL | 0 refills | Status: DC

## 2016-11-05 MED ORDER — OXYCODONE HCL 5 MG PO TABS: 5.0000 mg | ORAL_TABLET | ORAL | 0 refills | Status: DC | PRN

## 2016-11-05 NOTE — Discharge Instructions (Signed)
Return to the emergency department with any severe abdominal pain, fever, nausea, vomiting or new concern.

## 2016-11-05 NOTE — ED Notes (Signed)
Pt verbalizes DC teaching and need to follow up with surgeon in morning related to continued pain. Pt ambulatory at DC.

## 2016-11-05 NOTE — ED Provider Notes (Signed)
[facility]-EMERGENCY DEPT Provider Note   CSN: [id number] Arrival date & time: 11/04/16  2036     History   Chief Complaint Chief Complaint  Patient presents with  . Cough  . Post-op Problem    pain    HPI Brenda Bell is a 46 y.o. female.  Patient with laparoscopic appendectomy on 10/26/16 presents with cough and continued post-operative abdominal pain. No fever, nausea or vomiting. She reports she is having regular bowel movements without difficulty. No bleeding per rectum or melena. She presents with new onset cough that started just after surgery. No SOB. The cough is dry and feels like an irritation in her throat rather than chest congestion. It is persistent and frequent.    The history is provided by the patient and the spouse. No language interpreter was used.  Cough  Pertinent negatives include no chills, no headaches, no rhinorrhea and no sore throat.    Past Medical History:  Diagnosis Date  . Anemia   . B12 deficiency   . Gastritis   . Vitamin D deficiency     Patient Active Problem List   Diagnosis Date Noted  . Acute appendicitis 10/26/2016  . Appendicitis 10/26/2016  . Anemia, iron deficiency 08/20/2013  . Periodic health assessment, general screening, adult 01/29/2013  . SKIN RASH 12/19/2009  . FURUNCLE 05/26/2009  . PHARYNGITIS, STREPTOCOCCAL 08/11/2008  . VIRAL URI 05/25/2008  . COSTOCHONDRITIS, LEFT 11/19/2007  . FATIGUE 08/12/2007  . ANEMIA, IRON DEFICIENCY 02/02/2007  . ALLERGIC RHINITIS 01/17/2007  . ASTHMA 12/30/2006  . GERD 12/30/2006  . 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 LUMP OR MASS IN BREAST 11/06/2005  . 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 BREAST SURGERY Left    cyst removed  . CESAREAN SECTION    . CHOLECYSTECTOMY    . KNEE SURGERY Right    tendon repair  . LAPAROSCOPIC APPENDECTOMY N/A 10/26/2016   Procedure: APPENDECTOMY LAPAROSCOPIC;  Surgeon: Berna Bue, MD;  Location: [facility]  OR;  Service: General;  Laterality: N/A;  . 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 TONSILLECTOMY Bilateral     OB History    Gravida Para Term Preterm AB Living   3 3 3     3    SAB TAB Ectopic Multiple Live Births                   Home Medications    Prior to Admission medications   Medication Sig Start Date End Date Taking? Authorizing Provider  acetaminophen (TYLENOL) 325 MG tablet Take 2 tablets (650 mg total) by mouth every 6 (six) hours as needed for mild pain, moderate pain or headache. 10/27/16   Sherrie George, PA-C  albuterol (PROVENTIL HFA;VENTOLIN HFA) 108 (90 BASE) MCG/ACT inhaler Inhale 1-2 puffs into the lungs every 6 (six) hours as needed for wheezing or shortness of breath. Patient not taking: Reported on 12/18/2015 11/22/14   Tonye Pearson, MD  ibuprofen (ADVIL,MOTRIN) 200 MG tablet You can take 2 tablets every 4 hours as needed for pain.  Follow package instructions. 10/27/16   Sherrie George, PA-C  oxyCODONE (OXY IR/ROXICODONE) 5 MG immediate release tablet Take 1 tablet (5 mg total) by mouth every 4 (four) hours as needed for moderate pain, severe pain or breakthrough pain. 10/27/16   Sherrie George, PA-C    Family History Family History  Problem Relation  Age of Onset  . Hypertension Mother   . Diabetes Father     Social History Social History  Substance Use Topics  . Smoking status: Never Smoker  . Smokeless tobacco: Never Used  . Alcohol use No     Allergies   Patient has no known allergies.   Review of Systems Review of Systems  Constitutional: Negative for chills and fever.  HENT: Negative.  Negative for congestion, rhinorrhea, sore throat and trouble swallowing.   Respiratory: Positive for cough.   Cardiovascular: Negative.   Gastrointestinal: Positive for abdominal pain. Negative for nausea and vomiting.  Genitourinary: Negative.  Negative for dysuria.  Musculoskeletal:  Negative.  Negative for back pain.  Skin: Negative.   Neurological: Negative.  Negative for headaches.     Physical Exam Updated Vital Signs BP (!) 130/59 (BP Location: Left Arm)   Pulse 67   Temp 98.2 F (36.8 C) (Oral)   Resp 20   SpO2 99%   Physical Exam  Constitutional: She is oriented to person, place, and time. She appears well-developed and well-nourished.  HENT:  Head: Normocephalic.  Nose: Nose normal.  Mouth/Throat: Oropharynx is clear and moist.  Neck: Normal range of motion. Neck supple.  Cardiovascular: Normal rate.   Pulmonary/Chest: Effort normal. She has no wheezes. She has no rales.  Abdominal: Soft. Bowel sounds are normal. She exhibits no distension. There is tenderness (Diffuse abdominal tenderness. ). There is no rebound and no guarding.  Multiple laparoscopic incision sites unremarkable, without redness, wound dehiscence or drainage.   Musculoskeletal: Normal range of motion.  Neurological: She is alert and oriented to person, place, and time.  Skin: Skin is warm and dry. No rash noted.  Psychiatric: She has a normal mood and affect.     ED Treatments / Results  Labs (all labs ordered are listed, but only abnormal results are displayed) Labs Reviewed  COMPREHENSIVE METABOLIC PANEL - Abnormal; Notable for the following:       Result Value   Glucose, Bld 100 (*)    Calcium 8.5 (*)    Albumin 3.4 (*)    Anion gap 4 (*)    All other components within normal limits  CBC WITH DIFFERENTIAL/PLATELET - Abnormal; Notable for the following:    Hemoglobin 9.7 (*)    HCT 32.0 (*)    MCV 77.9 (*)    MCH 23.6 (*)    RDW 16.2 (*)    All other components within normal limits  URINALYSIS, ROUTINE W REFLEX MICROSCOPIC - Abnormal; Notable for the following:    Specific Gravity, Urine <1.005 (*)    All other components within normal limits  I-STAT CG4 LACTIC ACID, ED  I-STAT CG4 LACTIC ACID, ED   Results for orders placed or performed during the hospital  encounter of 11/04/16  Comprehensive metabolic panel  Result Value Ref Range   Sodium 136 135 - 145 mmol/L   Potassium 3.7 3.5 - 5.1 mmol/L   Chloride 109 101 - 111 mmol/L   CO2 23 22 - 32 mmol/L   Glucose, Bld 100 (H) 65 - 99 mg/dL   BUN 9 6 - 20 mg/dL   Creatinine, Ser 4.090.58 0.44 - 1.00 mg/dL   Calcium 8.5 (L) 8.9 - 10.3 mg/dL   Total Protein 7.0 6.5 - 8.1 g/dL   Albumin 3.4 (L) 3.5 - 5.0 g/dL   AST 17 15 - 41 U/L   ALT 14 14 - 54 U/L   Alkaline Phosphatase 60 38 - 126  U/L   Total Bilirubin 0.4 0.3 - 1.2 mg/dL   GFR calc non Af Amer >60 >60 mL/min   GFR calc Af Amer >60 >60 mL/min   Anion gap 4 (L) 5 - 15  CBC with Differential  Result Value Ref Range   WBC 5.6 4.0 - 10.5 K/uL   RBC 4.11 3.87 - 5.11 MIL/uL   Hemoglobin 9.7 (L) 12.0 - 15.0 g/dL   HCT 16.1 (L) 09.6 - 04.5 %   MCV 77.9 (L) 78.0 - 100.0 fL   MCH 23.6 (L) 26.0 - 34.0 pg   MCHC 30.3 30.0 - 36.0 g/dL   RDW 40.9 (H) 81.1 - 91.4 %   Platelets 340 150 - 400 K/uL   Neutrophils Relative % 42 %   Neutro Abs 2.3 1.7 - 7.7 K/uL   Lymphocytes Relative 42 %   Lymphs Abs 2.3 0.7 - 4.0 K/uL   Monocytes Relative 12 %   Monocytes Absolute 0.7 0.1 - 1.0 K/uL   Eosinophils Relative 4 %   Eosinophils Absolute 0.2 0.0 - 0.7 K/uL   Basophils Relative 0 %   Basophils Absolute 0.0 0.0 - 0.1 K/uL  Urinalysis, Routine w reflex microscopic  Result Value Ref Range   Color, Urine YELLOW YELLOW   APPearance CLEAR CLEAR   Specific Gravity, Urine <1.005 (L) 1.005 - 1.030   pH 6.0 5.0 - 8.0   Glucose, UA NEGATIVE NEGATIVE mg/dL   Hgb urine dipstick NEGATIVE NEGATIVE   Bilirubin Urine NEGATIVE NEGATIVE   Ketones, ur NEGATIVE NEGATIVE mg/dL   Protein, ur NEGATIVE NEGATIVE mg/dL   Nitrite NEGATIVE NEGATIVE   Leukocytes, UA NEGATIVE NEGATIVE  I-Stat CG4 Lactic Acid, ED  Result Value Ref Range   Lactic Acid, Venous 0.90 0.5 - 1.9 mmol/L    EKG  EKG Interpretation None       Radiology Dg Chest 2 View  Result Date:  11/04/2016 CLINICAL DATA:  Dry cough and pain since appendectomy last week. EXAM: CHEST  2 VIEW COMPARISON:  03/13/2013 FINDINGS: The heart size and mediastinal contours are within normal limits. Both lungs are clear. The visualized skeletal structures are unremarkable. IMPRESSION: No active cardiopulmonary disease. Electronically Signed   By: Tollie Eth M.D.   On: 11/04/2016 21:47    Procedures Procedures (including critical care time)  Medications Ordered in ED Medications - No data to display   Initial Impression / Assessment and Plan / ED Course  I have reviewed the triage vital signs and the nursing notes.  Pertinent labs & imaging results that were available during my care of the patient were reviewed by me and considered in my medical decision making (see chart for details).     Patient presents with diffuse abdominal pain s/p uncomplicated laparoscopic appendectomy 10/26/16. Pain is unchanged. No fever. She has a dry cough that is making her abdominal pain worse when she coughs. Cough is dry and persistent.   Abdomen is diffusely tender. Surgical incision sites appear to be healing well. No fever, leukocytosis. She is moving her bowels regularly, good bowel sounds throughout. Imaging is not felt to be necessary tonight. She is given strict return precautions and strongly encouraged to follow up by calling her surgeon's office in the morning to make them aware of ongoing pain.   Will treat cough with Tessalon. The patient is requesting more pain medication. Discussed that pain management is best provided by the surgical office. Will provide #3 oxycodone and ask her to contact the surgeon for  further pain management.   Final Clinical Impressions(s) / ED Diagnoses   Final diagnoses:  None   1. Post-operative pain 2. Cough  New Prescriptions New Prescriptions   No medications on file     Elpidio Anis, Cordelia Poche 11/05/16 0037    Gilda Crease, MD 11/05/16 [phone]

## 2016-11-21 NOTE — Progress Notes (Signed)
Patient ID: Brenda Bell, female   DOB: January 10, 1971, 46 y.o.   MRN: [id number]    Brenda Bell, is a 46 y.o. female  WUJ:[id number]  [id number]  DOB - 1971/02/23  Subjective:  Chief Complaint and HPI: Brenda Bell is a 46 y.o. female here today to establish care and for a follow up visit After being seen in the ED 11/04/2016 post-operatively(laparascopic appendectomy 10/26/2016) for pain and cough.  CXR was negative.  Normal white count.  Hgb=9.7.  She was treated with tessalon perles and given #3 oxycodone then told to f/up with the surgeon if her pain required more opiate pain medication.  She continues to gave a cough now for 3 weeks post op.  Cough is mostly non-productive, but sometimes greenish mucus.  No f/c. No hemoptysis.  Pain from surgery continues to lessen.  She saw her surgeon for f/up 11/13/2016 and was discharged from care pending any new problems or new complications.  Bowels moving normally.  No N/V/D. Ibuprofen helps if needed for discomfort.  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  H/o being on Integra for anemia bc iron caused severe constipation. Not taking currently.   ED/Hospital notes reviewed.   Social History: married:  ROS:   Constitutional:  No f/c, No night sweats, No unexplained weight loss. EENT:  No vision changes, No blurry vision, No hearing changes. No mouth, throat, or ear problems.  Respiratory: + cough, No SOB Cardiac: No CP, no palpitations GI:  Improving abd pain, No N/V/D. GU: No Urinary s/sx Musculoskeletal: No joint pain Neuro: No headache, no dizziness, no motor weakness.  Skin: No rash Endocrine:  No polydipsia. No polyuria.  Psych: Denies SI/HI  No problems updated.  ALLERGIES: No Known Allergies  PAST MEDICAL HISTORY: Past Medical History:  Diagnosis Date  . Anemia   . B12 deficiency   . Gastritis   . Vitamin D deficiency     MEDICATIONS AT HOME: Prior to Admission medications   Medication Sig Start Date  End Date Taking? Authorizing Provider  acetaminophen (TYLENOL) 325 MG tablet Take 2 tablets (650 mg total) by mouth every 6 (six) hours as needed for mild pain, moderate pain or headache. 10/27/16   Sherrie George, PA-C  albuterol (PROVENTIL HFA;VENTOLIN HFA) 108 (90 BASE) MCG/ACT inhaler Inhale 1-2 puffs into the lungs every 6 (six) hours as needed for wheezing or shortness of breath. Patient not taking: Reported on 12/18/2015 11/22/14   Tonye Pearson, MD  azithromycin [facility]) 250 MG tablet Take 2 today then 1 daily until gone 11/22/16   Georgian Co M, PA-C  benzonatate (TESSALON) 100 MG capsule Take 1 capsule (100 mg total) by mouth every 8 (eight) hours. 11/22/16   Anders Simmonds, PA-C  ibuprofen (ADVIL,MOTRIN) 200 MG tablet You can take 2 tablets every 4 hours as needed for pain.  Follow package instructions. 10/27/16   Sherrie George, PA-C     Objective:  EXAM:   Vitals:   11/22/16 0848  BP: 95/68  Pulse: 73  Resp: 18  Temp: 98.9 F (37.2 C)  TempSrc: Oral  SpO2: 100%  Weight: 183 lb (83 kg)  Height: 5\' 5"  (1.651 m)    General appearance : A&OX3. NAD. Non-toxic-appearing HEENT: Atraumatic and Normocephalic.  PERRLA. EOM intact.  TM clear B. Mouth-MMM, post pharynx WNL w/o erythema, No PND. Neck: supple, no JVD. No cervical lymphadenopathy. No thyromegaly Chest/Lungs:  Breathing-non-labored, Good air entry bilaterally, breath sounds normal without rales, rhonchi, or wheezing  CVS: S1 S2 regular,  no murmurs, gallops, rubs  Abdomen: Bowel sounds present, Non tender and not distended with no gaurding, rigidity or rebound.  Incision sites closed and healing without any drainage or erythema. Extremities: Bilateral Lower Ext shows no edema, both legs are warm to touch with = pulse throughout Neurology:  CN II-XII grossly intact, Non focal.   Psych:  TP linear. J/I WNL. Normal speech. Appropriate eye contact and affect.  Skin:  No Rash  Data Review Lab Results    Component Value Date   HGBA1C 5.1 01/20/2015   HGBA1C 5.1 01/20/2015     Assessment & Plan   1. Iron deficiency anemia, unspecified iron deficiency anemia type - CBC with Differential/Platelet - Iron, TIBC and Ferritin Panel  2. Cough Will cover for atypicals since she has been having this cough and phlegm 3 weeks s/p surgery.   - azithromycin (ZITHROMAX) 250 MG tablet; Take 2 today then 1 daily until gone  Dispense: 6 tablet; Refill: 0 - benzonatate (TESSALON) 100 MG capsule; Take 1 capsule (100 mg total) by mouth every 8 (eight) hours.  Dispense: 21 capsule; Refill: 0  3. S/P appendectomy Healing appropriate with mild soreness.    Patient have been counseled extensively about nutrition and exercise  Return in about 6 weeks (around 01/03/2017) for assign pcp; recheck iron.  The patient was given clear instructions to go to ER or return to medical center if symptoms don't improve, worsen or new problems develop. The patient verbalized understanding. The patient was told to call to get lab results if they haven't heard anything in the next week.     Georgian Co, PA-C [facility] and [facility] [place], [place] [phone]   11/22/2016, 9:00 AM

## 2016-11-22 ENCOUNTER — Ambulatory Visit: Payer: Self-pay | Attending: Internal Medicine | Admitting: Physician Assistant

## 2016-11-22 ENCOUNTER — Encounter: Payer: Self-pay | Admitting: Physician Assistant

## 2016-11-22 VITALS — BP 95/68 | HR 73 | Temp 98.9°F | Resp 18 | Ht 65.0 in | Wt 183.0 lb

## 2016-11-22 DIAGNOSIS — R05 Cough: Secondary | ICD-10-CM

## 2016-11-22 DIAGNOSIS — Z9049 Acquired absence of other specified parts of digestive tract: Secondary | ICD-10-CM

## 2016-11-22 DIAGNOSIS — D509 Iron deficiency anemia, unspecified: Secondary | ICD-10-CM

## 2016-11-22 DIAGNOSIS — E559 Vitamin D deficiency, unspecified: Secondary | ICD-10-CM | POA: Insufficient documentation

## 2016-11-22 DIAGNOSIS — R059 Cough, unspecified: Secondary | ICD-10-CM

## 2016-11-22 DIAGNOSIS — E538 Deficiency of other specified B group vitamins: Secondary | ICD-10-CM | POA: Insufficient documentation

## 2016-11-22 DIAGNOSIS — Z9889 Other specified postprocedural states: Secondary | ICD-10-CM | POA: Insufficient documentation

## 2016-11-22 MED ORDER — AZITHROMYCIN 250 MG PO TABS: ORAL_TABLET | ORAL | 0 refills | Status: DC

## 2016-11-22 MED ORDER — BENZONATATE 100 MG PO CAPS: 100.0000 mg | ORAL_CAPSULE | Freq: Three times a day (TID) | ORAL | 0 refills | Status: DC

## 2016-11-22 MED FILL — BENZONATATE 100 MG CAPSULE: 100 | 7 days supply | Qty: 21 | Fill #0

## 2016-11-22 MED FILL — AZITHROMYCIN 250 MG TABLET: 250 | 5 days supply | Qty: 6 | Fill #0

## 2016-11-23 ENCOUNTER — Other Ambulatory Visit: Payer: Self-pay | Admitting: Physician Assistant

## 2016-11-23 DIAGNOSIS — D649 Anemia, unspecified: Secondary | ICD-10-CM

## 2016-11-23 LAB — CBC WITH DIFFERENTIAL/PLATELET
Basophils Absolute: 0 10*3/uL (ref 0.0–0.2)
Basos: 1 %
EOS (ABSOLUTE): 0.2 10*3/uL (ref 0.0–0.4)
Eos: 5 %
Hematocrit: 31.6 % — ABNORMAL LOW (ref 34.0–46.6)
Hemoglobin: 9.8 g/dL — ABNORMAL LOW (ref 11.1–15.9)
Immature Grans (Abs): 0 10*3/uL (ref 0.0–0.1)
Immature Granulocytes: 0 %
Lymphocytes Absolute: 2.1 10*3/uL (ref 0.7–3.1)
Lymphs: 47 %
MCH: 24 pg — ABNORMAL LOW (ref 26.6–33.0)
MCHC: 31 g/dL — ABNORMAL LOW (ref 31.5–35.7)
MCV: 78 fL — ABNORMAL LOW (ref 79–97)
Monocytes Absolute: 0.7 10*3/uL (ref 0.1–0.9)
Monocytes: 17 %
Neutrophils Absolute: 1.3 10*3/uL — ABNORMAL LOW (ref 1.4–7.0)
Neutrophils: 30 %
Platelets: 279 10*3/uL (ref 150–379)
RBC: 4.08 x10E6/uL (ref 3.77–5.28)
RDW: 17.4 % — ABNORMAL HIGH (ref 12.3–15.4)
WBC: 4.4 10*3/uL (ref 3.4–10.8)

## 2016-11-23 LAB — IRON,TIBC AND FERRITIN PANEL
FERRITIN: 9 ng/mL — AB (ref 15–150)
IRON SATURATION: 5 % — AB (ref 15–55)
IRON: 17 ug/dL — AB (ref 27–159)
TIBC: 360 ug/dL (ref 250–450)
UIBC: 343 ug/dL (ref 131–425)

## 2016-11-23 MED ORDER — FERROUS SULFATE 325 (65 FE) MG PO TBEC: DELAYED_RELEASE_TABLET | ORAL | 3 refills | Status: DC

## 2016-11-30 ENCOUNTER — Telehealth: Payer: Self-pay | Admitting: *Deleted

## 2016-11-30 NOTE — Telephone Encounter (Signed)
Medical Assistant used [employer] to contact patient.  Interpreter Name:Edwardo Interpreter #: [id number] Patient was not available, [employer] left patient a voicemail.  !!!Please inform patient of iron being low and needing to pick up iron supplement from [facility]. Patient should take twice daily with meals for two weeks and then once daily. A recheck will be completed at the next visit!!!

## 2016-11-30 NOTE — Telephone Encounter (Signed)
-----   Message from Anders SimmondsAngela M McClung, [place]PA-C sent at 11/23/2016 10:34 AM EDT ----- Please call patient.  Her iron is very low.  [facility] or she can resume taking the Integra Iron she has at home.  I recommend twice daily with food for 2 weeks then once daily.  We will recheck at follow-up.  Thanks, Georgian CoAngela McClung, PA-C

## 2017-01-03 ENCOUNTER — Ambulatory Visit: Payer: Self-pay | Admitting: Family Medicine

## 2017-02-15 ENCOUNTER — Ambulatory Visit: Payer: Self-pay | Admitting: Family Medicine

## 2017-02-25 ENCOUNTER — Ambulatory Visit: Payer: Self-pay | Admitting: Physician Assistant

## 2017-02-25 MED FILL — SULFAMETHOXAZOLE-TMP DS TAB: 800-160 | 7 days supply | Qty: 14 | Fill #0

## 2017-02-25 MED FILL — FLUCONAZOLE 150 MG TABLET: 150 | 1 days supply | Qty: 1 | Fill #0

## 2017-02-26 ENCOUNTER — Encounter (HOSPITAL_COMMUNITY): Payer: Self-pay | Admitting: Emergency Medicine

## 2017-02-26 ENCOUNTER — Other Ambulatory Visit: Payer: Self-pay

## 2017-02-26 ENCOUNTER — Emergency Department (HOSPITAL_COMMUNITY)
Admission: EM | Admit: 2017-02-26 | Discharge: 2017-02-26 | Disposition: A | Discharge status: Home / Self Care | Payer: Self-pay | Attending: Emergency Medicine | Admitting: Emergency Medicine

## 2017-02-26 DIAGNOSIS — R102 Pelvic and perineal pain: Secondary | ICD-10-CM | POA: Insufficient documentation

## 2017-02-26 DIAGNOSIS — J45909 Unspecified asthma, uncomplicated: Secondary | ICD-10-CM | POA: Insufficient documentation

## 2017-02-26 DIAGNOSIS — N390 Urinary tract infection, site not specified: Secondary | ICD-10-CM | POA: Insufficient documentation

## 2017-02-26 LAB — URINALYSIS, ROUTINE W REFLEX MICROSCOPIC
Bilirubin Urine: NEGATIVE
GLUCOSE, UA: NEGATIVE mg/dL
KETONES UR: NEGATIVE mg/dL
Nitrite: NEGATIVE
PH: 7 (ref 5.0–8.0)
Protein, ur: NEGATIVE mg/dL
RBC / HPF: NONE SEEN RBC/hpf (ref 0–5)
SPECIFIC GRAVITY, URINE: 1.001 — AB (ref 1.005–1.030)
SQUAMOUS EPITHELIAL / LPF: NONE SEEN

## 2017-02-26 LAB — WET PREP, GENITAL
CLUE CELLS WET PREP: NONE SEEN
SPERM: NONE SEEN
TRICH WET PREP: NONE SEEN
Yeast Wet Prep HPF POC: NONE SEEN

## 2017-02-26 LAB — CBC WITH DIFFERENTIAL/PLATELET
BLASTS: 0 %
Band Neutrophils: 0 %
Basophils Absolute: 0 10*3/uL (ref 0.0–0.1)
Basophils Relative: 0 %
EOS PCT: 5 %
Eosinophils Absolute: 0.2 10*3/uL (ref 0.0–0.7)
HEMATOCRIT: 28.3 % — AB (ref 36.0–46.0)
Hemoglobin: 8.3 g/dL — ABNORMAL LOW (ref 12.0–15.0)
LYMPHS ABS: 1.3 10*3/uL (ref 0.7–4.0)
LYMPHS PCT: 32 %
MCH: 21.4 pg — AB (ref 26.0–34.0)
MCHC: 29.3 g/dL — ABNORMAL LOW (ref 30.0–36.0)
MCV: 73.1 fL — AB (ref 78.0–100.0)
MONOS PCT: 13 %
Metamyelocytes Relative: 0 %
Monocytes Absolute: 0.5 10*3/uL (ref 0.1–1.0)
Myelocytes: 0 %
NEUTROS ABS: 2.2 10*3/uL (ref 1.7–7.7)
NEUTROS PCT: 50 %
NRBC: 0 /100{WBCs}
Other: 0 %
PLATELETS: 326 10*3/uL (ref 150–400)
Promyelocytes Absolute: 0 %
RBC: 3.87 MIL/uL (ref 3.87–5.11)
RDW: 15.8 % — AB (ref 11.5–15.5)
WBC: 4.2 10*3/uL (ref 4.0–10.5)

## 2017-02-26 LAB — BASIC METABOLIC PANEL
ANION GAP: 6 (ref 5–15)
BUN: 7 mg/dL (ref 6–20)
CALCIUM: 8.9 mg/dL (ref 8.9–10.3)
CO2: 24 mmol/L (ref 22–32)
Chloride: 109 mmol/L (ref 101–111)
Creatinine, Ser: 0.56 mg/dL (ref 0.44–1.00)
GFR calc Af Amer: >60 mL/min (ref >60)
GLUCOSE: 91 mg/dL (ref 65–99)
POTASSIUM: 3.6 mmol/L (ref 3.5–5.1)
Sodium: 139 mmol/L (ref 135–145)

## 2017-02-26 LAB — I-STAT CG4 LACTIC ACID, ED: Lactic Acid, Venous: 1.12 mmol/L (ref 0.5–1.9)

## 2017-02-26 LAB — POC URINE PREG, ED: Preg Test, Ur: NEGATIVE

## 2017-02-26 MED ORDER — AZITHROMYCIN 250 MG PO TABS
1000.0000 mg | ORAL_TABLET | Freq: Once | ORAL | Status: AC
  Administered 2017-02-26: 1000 mg via ORAL
  Filled 2017-02-26: qty 4

## 2017-02-26 MED ORDER — IBUPROFEN 600 MG PO TABS: 600.0000 mg | ORAL_TABLET | Freq: Four times a day (QID) | ORAL | 0 refills | Status: DC | PRN

## 2017-02-26 MED ORDER — LIDOCAINE HCL (PF) 1 % IJ SOLN
INTRAMUSCULAR | Status: AC
  Administered 2017-02-26: 1 mL via INTRAMUSCULAR
  Filled 2017-02-26: qty 5

## 2017-02-26 MED ORDER — CEFTRIAXONE SODIUM 250 MG IJ SOLR
250.0000 mg | Freq: Once | INTRAMUSCULAR | Status: AC
  Administered 2017-02-26: 250 mg via INTRAMUSCULAR
  Filled 2017-02-26: qty 250

## 2017-02-26 NOTE — Discharge Instructions (Signed)
1.  Make an appointment to see your gynecologist in the next 5-7 days for recheck. 2.  Finish your antibiotics. 3.  Return to the emergency department if you have worsening pain, fever vomiting or other concerning symptoms.

## 2017-02-26 NOTE — ED Triage Notes (Addendum)
States has had UTI since last Friday saw dr yesterday given antibiotics but she is in to much pain, having frequency and abd pain burning and pressure

## 2017-02-26 NOTE — ED Provider Notes (Signed)
[facility] Provider Note   CSN: [id number] Arrival date & time: 02/26/17  1232     History   Chief Complaint Chief Complaint  Patient presents with  . Recurrent UTI    HPI Brenda Bell is a 46 y.o. female.  HPI Patient reports burning and pain with urination for 5 days.  For the first 2 days she tried over-the-counter medications.  Saw her doctor 2 days ago and got a prescription for Bactrim.  Also get a prescription for Diflucan.  She reports she took 2 doses of Bactrim yesterday and 1 dose this morning.  She was feeling a little better yesterday evening but this morning again was having a lot of pulsating and burning pain in her left lower abdomen and vaginal area.  She reports she has had some abnormal vaginal spotting.  No fever no nausea no vomiting.  She reports for a longer time preceding her symptoms of burning with urination, she has noted left lower quadrant pain with intercourse with her husband. Past Medical History:  Diagnosis Date  . Anemia   . B12 deficiency   . Gastritis   . Vitamin D deficiency     Patient Active Problem List   Diagnosis Date Noted  . Acute appendicitis 10/26/2016  . Appendicitis 10/26/2016  . Anemia, iron deficiency 08/20/2013  . Periodic health assessment, general screening, adult 01/29/2013  . SKIN RASH 12/19/2009  . FURUNCLE 05/26/2009  . PHARYNGITIS, STREPTOCOCCAL 08/11/2008  . VIRAL URI 05/25/2008  . COSTOCHONDRITIS, LEFT 11/19/2007  . FATIGUE 08/12/2007  . ANEMIA, IRON DEFICIENCY 02/02/2007  . ALLERGIC RHINITIS 01/17/2007  . ASTHMA 12/30/2006  . GERD 12/30/2006  . 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 LUMP OR MASS IN BREAST 11/06/2005  . 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 BREAST SURGERY Left    cyst removed  . CESAREAN SECTION    . CHOLECYSTECTOMY    . KNEE SURGERY Right    tendon repair  . LAPAROSCOPIC APPENDECTOMY N/A 10/26/2016     Procedure: APPENDECTOMY LAPAROSCOPIC;  Surgeon: Berna Bueonnor, Chelsea A, MD;  Location: [facility] OR;  Service: General;  Laterality: N/A;  . 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 TONSILLECTOMY Bilateral     OB History    Gravida Para Term Preterm AB Living   3 3 3     3    SAB TAB Ectopic Multiple Live Births                   Home Medications    Prior to Admission medications   Medication Sig Start Date End Date Taking? Authorizing Provider  fluconazole (DIFLUCAN) 150 MG tablet Take 150 mg by mouth once.   Yes [provider]  ibuprofen (ADVIL,MOTRIN) 200 MG tablet You can take 2 tablets every 4 hours as needed for pain.  Follow package instructions. 10/27/16  Yes Sherrie GeorgeJennings, Willard, PA-C  Methenamine-Sodium Salicylate (CYSTEX PO) Take 2 tablets by mouth every 4 (four) hours as needed (frequent urination, pressure).   Yes [provider]  sulfamethoxazole-trimethoprim (BACTRIM DS,SEPTRA DS) 800-160 MG tablet Take 1 tablet by mouth 2 (two) times daily.   Yes [provider]  albuterol (PROVENTIL HFA;VENTOLIN HFA) 108 (90 BASE) MCG/ACT inhaler Inhale 1-2 puffs into the lungs every 6 (six) hours as needed for wheezing or shortness of breath. Patient not taking: Reported on 12/18/2015  11/22/14   Tonye Pearsonoolittle, Robert P, MD  benzonatate (TESSALON) 100 MG capsule Take 1 capsule (100 mg total) by mouth every 8 (eight) hours. Patient not taking: Reported on 02/26/2017 11/22/16   Anders SimmondsMcClung, Angela M, PA-C  ferrous sulfate 325 (65 FE) MG EC tablet Take 1 twice daily X 2 weeks then 1 daily Patient not taking: Reported on 02/26/2017 11/23/16   Anders SimmondsMcClung, Angela M, PA-C  ibuprofen (ADVIL,MOTRIN) 600 MG tablet Take 1 tablet (600 mg total) by mouth every 6 (six) hours as needed. 02/26/17   Arby BarrettePfeiffer, Renarda Mullinix, MD    Family History Family History  Problem Relation Age of Onset  . Hypertension Mother   . Diabetes Father     Social  History Social History   Tobacco Use  . Smoking status: Never Smoker  . Smokeless tobacco: Never Used  Substance Use Topics  . Alcohol use: No    Alcohol/week: 0.0 oz  . Drug use: No     Allergies   Patient has no known allergies.   Review of Systems Review of Systems 10 Systems reviewed and are negative for acute change except as noted in the HPI.   Physical Exam Updated Vital Signs BP 105/67   Pulse 78   Temp 98 F (36.7 C) (Oral)   Resp 16   SpO2 100%   Physical Exam  Constitutional: She is oriented to person, place, and time. She appears well-developed and well-nourished. No distress.  HENT:  Head: Normocephalic and atraumatic.  Eyes: Conjunctivae and EOM are normal.  Neck: Neck supple.  Cardiovascular: Normal rate, regular rhythm, normal heart sounds and intact distal pulses.  No murmur heard. Pulmonary/Chest: Effort normal and breath sounds normal. No respiratory distress.  Abdominal: Soft. She exhibits no distension and no mass. There is tenderness. There is no guarding.  Left lower quadrant tender to palpation.  Suprapubic tenderness palpation.  Positive flank tenderness bilaterally.  Genitourinary:  Genitourinary Comments: Normal external female genitalia.  Speculum exam, cervix normal in appearance no drainage discharge or friability.  Bimanual diffuse uterine tenderness and left adnexal tenderness.  Musculoskeletal: Normal range of motion. She exhibits no edema or tenderness.  Neurological: She is alert and oriented to person, place, and time. No cranial nerve deficit. She exhibits normal muscle tone. Coordination normal.  Skin: Skin is warm and dry.  Psychiatric: She has a normal mood and affect.  Nursing note and vitals reviewed.    ED Treatments / Results  Labs (all labs ordered are listed, but only abnormal results are displayed) Labs Reviewed  URINALYSIS, ROUTINE W REFLEX MICROSCOPIC - Abnormal; Notable for the following components:      Result  Value   Color, Urine COLORLESS (*)    Specific Gravity, Urine 1.001 (*)    Hgb urine dipstick SMALL (*)    Leukocytes, UA MODERATE (*)    Bacteria, UA RARE (*)    All other components within normal limits  CBC WITH DIFFERENTIAL/PLATELET - Abnormal; Notable for the following components:   Hemoglobin 8.3 (*)    HCT 28.3 (*)    MCV 73.1 (*)    MCH 21.4 (*)    MCHC 29.3 (*)    RDW 15.8 (*)    All other components within normal limits  WET PREP, GENITAL  URINE CULTURE  BASIC METABOLIC PANEL  POC URINE PREG, ED  I-STAT CG4 LACTIC ACID, ED  GC/CHLAMYDIA PROBE AMP ([place]) NOT AT [facility]    EKG  EKG Interpretation None  Radiology No results found.  Procedures Procedures (including critical care time)  Medications Ordered in ED Medications  cefTRIAXone (ROCEPHIN) injection 250 mg (not administered)  azithromycin (ZITHROMAX) tablet 1,000 mg (not administered)     Initial Impression / Assessment and Plan / ED Course  I have reviewed the triage vital signs and the nursing notes.  Pertinent labs & imaging results that were available during my care of the patient were reviewed by me and considered in my medical decision making (see chart for details).      Final Clinical Impressions(s) / ED Diagnoses   Final diagnoses:  Lower urinary tract infectious disease  Pelvic pain in female  Patient had UTI symptoms and was started on Bactrim.  She has had 3 doses.  She continued to have pelvic pain and burning this morning.  Pain does somewhat localized to the left lower quadrant.  Pelvic exam has general tender uterine and adnexal examination.  Patient is nontoxic and alert with a nonsurgical abdominal examination.  At this time, will treat with a dose of Rocephin and Zithromax in the emergency department for pelvic tenderness.  Urinalysis shows minimal white cells with some leuk esterase at this time.  Her UTI may be resolving as she has only just initiated treatment.  Plan  will be to continue her Bactrim, return precautions provided and follow-up plan reviewed.  ED Discharge Orders        Ordered    ibuprofen (ADVIL,MOTRIN) 600 MG tablet  Every 6 hours PRN     02/26/17 1729       Arby Barrette, MD 02/26/17 1731

## 2017-02-26 NOTE — ED Notes (Signed)
Pt oob to br with steady gait 

## 2017-02-26 NOTE — ED Notes (Signed)
Pt states she understands instructions and follow up. Home stable with steady gait with son.

## 2017-02-27 LAB — URINE CULTURE

## 2017-02-27 LAB — GC/CHLAMYDIA PROBE AMP (~~LOC~~) NOT AT ARMC
CHLAMYDIA, DNA PROBE: NEGATIVE
NEISSERIA GONORRHEA: NEGATIVE

## 2017-03-05 MED FILL — SULFAMETHOXAZOLE-TMP DS TAB: 800-160 | 7 days supply | Qty: 14 | Fill #0

## 2018-02-14 ENCOUNTER — Other Ambulatory Visit: Payer: Self-pay

## 2018-02-14 ENCOUNTER — Encounter (HOSPITAL_COMMUNITY): Payer: Self-pay | Admitting: Emergency Medicine

## 2018-02-14 ENCOUNTER — Ambulatory Visit (HOSPITAL_COMMUNITY)
Admission: EM | Admit: 2018-02-14 | Discharge: 2018-02-14 | Disposition: A | Discharge status: Home / Self Care | Payer: BLUE CROSS/BLUE SHIELD | Attending: Family Medicine | Admitting: Family Medicine

## 2018-02-14 DIAGNOSIS — J069 Acute upper respiratory infection, unspecified: Secondary | ICD-10-CM

## 2018-02-14 DIAGNOSIS — B9789 Other viral agents as the cause of diseases classified elsewhere: Secondary | ICD-10-CM

## 2018-02-14 MED ORDER — NAPROXEN 500 MG PO TABS: 500.0000 mg | ORAL_TABLET | Freq: Two times a day (BID) | ORAL | 0 refills | Status: DC

## 2018-02-14 MED ORDER — ALBUTEROL SULFATE HFA 108 (90 BASE) MCG/ACT IN AERS: 1.0000 | INHALATION_SPRAY | Freq: Four times a day (QID) | RESPIRATORY_TRACT | 0 refills | Status: DC | PRN

## 2018-02-14 MED ORDER — GUAIFENESIN-CODEINE 100-10 MG/5ML PO SOLN: 5.0000 mL | Freq: Three times a day (TID) | ORAL | 0 refills | Status: DC | PRN

## 2018-02-14 MED ORDER — CETIRIZINE HCL 10 MG PO CAPS: 10.0000 mg | ORAL_CAPSULE | Freq: Every day | ORAL | 0 refills | Status: DC

## 2018-02-14 NOTE — ED Triage Notes (Signed)
Pt reports a chronic problem with a cough.  She has had xrays in the past with no issues.  She states she has used an inhaler in the past and it really helps her.  She took Naproxen last night and stated she felt a little better and was able to sleep.

## 2018-02-14 NOTE — ED Provider Notes (Signed)
[facility]    CSN: [id number] Arrival date & time: 02/14/18  1008     History   Chief Complaint Chief Complaint  Patient presents with  . Cough    HPI Brenda Bell is a 47 y.o. female history of asthma, allergic rhinitis, presenting today for evaluation of cough and sore throat.  Patient states that she has had a dry cough.  Symptoms have been going on for approximately 3 to 4 days.  She has noticed when she lies on her left side she has irritation in side of her throat.  She has had previous tonsillectomy.  She has had occasional chills and headache.  Denies rhinorrhea.  Occasional posttussive emesis, but denies persistent nausea or vomiting.  She has tried NyQuil and DayQuil without relief.  She is also taken Naprosyn last night which helped her sleep more than the NyQuil.  Denies fevers.  In the past she has had improvement with albuterol inhalers, she is out of her inhaler at home.  HPI  Past Medical History:  Diagnosis Date  . Anemia   . B12 deficiency   . Gastritis   . Vitamin D deficiency     Patient Active Problem List   Diagnosis Date Noted  . Acute appendicitis 10/26/2016  . Appendicitis 10/26/2016  . Anemia, iron deficiency 08/20/2013  . Periodic health assessment, general screening, adult 01/29/2013  . SKIN RASH 12/19/2009  . FURUNCLE 05/26/2009  . PHARYNGITIS, STREPTOCOCCAL 08/11/2008  . VIRAL URI 05/25/2008  . COSTOCHONDRITIS, LEFT 11/19/2007  . FATIGUE 08/12/2007  . ANEMIA, IRON DEFICIENCY 02/02/2007  . ALLERGIC RHINITIS 01/17/2007  . ASTHMA 12/30/2006  . GERD 12/30/2006  . 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 LUMP OR MASS IN BREAST 11/06/2005  . 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 BREAST SURGERY Left    cyst removed  . CESAREAN SECTION    . CHOLECYSTECTOMY    . KNEE SURGERY Right    tendon repair  . LAPAROSCOPIC APPENDECTOMY N/A 10/26/2016   Procedure: APPENDECTOMY LAPAROSCOPIC;   Surgeon: Berna Bueonnor, Chelsea A, MD;  Location: [facility];  Service: General;  Laterality: N/A;  . 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 TONSILLECTOMY Bilateral     OB History    Gravida  3   Para  3   Term  3   Preterm      AB      Living  3     SAB      TAB      Ectopic      Multiple      Live Births               Home Medications    Prior to Admission medications   Medication Sig Start Date End Date Taking? Authorizing Provider  albuterol (PROVENTIL HFA;VENTOLIN HFA) 108 (90 Base) MCG/ACT inhaler Inhale 1-2 puffs into the lungs every 6 (six) hours as needed for wheezing or shortness of breath. 02/14/18   Glorine Hanratty C, PA-C  Cetirizine HCl 10 MG CAPS Take 1 capsule (10 mg total) by mouth daily for 10 days. 02/14/18 02/24/18  Bruk Tumolo C, PA-C  guaiFENesin-codeine 100-10 MG/5ML syrup Take 5 mLs by mouth 3 (three) times daily as needed for cough. Do not drive or work after using 02/14/18   Donice Alperin, Ryder SystemHallie C, PA-C  ibuprofen (ADVIL,MOTRIN) 200 MG tablet You can  take 2 tablets every 4 hours as needed for pain.  Follow package instructions. 10/27/16   Sherrie George, PA-C  ibuprofen (ADVIL,MOTRIN) 600 MG tablet Take 1 tablet (600 mg total) by mouth every 6 (six) hours as needed. 02/26/17   Arby Barrette, MD  Methenamine-Sodium Salicylate (CYSTEX PO) Take 2 tablets by mouth every 4 (four) hours as needed (frequent urination, pressure).    [provider]  naproxen (NAPROSYN) 500 MG tablet Take 1 tablet (500 mg total) by mouth 2 (two) times daily. 02/14/18   Mkayla Steele, Junius Creamer, PA-C    Family History Family History  Problem Relation Age of Onset  . Hypertension Mother   . Diabetes Father     Social History Social History   Tobacco Use  . Smoking status: Never Smoker  . Smokeless tobacco: Never Used  Substance Use Topics  . Alcohol use: No    Alcohol/week: 0.0 standard drinks  . Drug use: No      Allergies   Patient has no known allergies.   Review of Systems Review of Systems  Constitutional: Positive for chills. Negative for activity change, appetite change, fatigue and fever.  HENT: Positive for congestion and sore throat. Negative for ear pain, rhinorrhea, sinus pressure and trouble swallowing.   Eyes: Negative for discharge and redness.  Respiratory: Positive for cough. Negative for chest tightness and shortness of breath.   Cardiovascular: Negative for chest pain.  Gastrointestinal: Negative for abdominal pain, diarrhea, nausea and vomiting.  Musculoskeletal: Negative for myalgias.  Skin: Negative for rash.  Neurological: Positive for headaches. Negative for dizziness and light-headedness.     Physical Exam Triage Vital Signs ED Triage Vitals  Enc Vitals Group     BP 02/14/18 1058 110/64     Pulse Rate 02/14/18 1058 75     Resp 02/14/18 1058 16     Temp 02/14/18 1058 98.7 F (37.1 C)     Temp Source 02/14/18 1058 Oral     SpO2 02/14/18 1058 100 %     Weight --      Height --      Head Circumference --      Peak Flow --      Pain Score 02/14/18 1104 0     Pain Loc --      Pain Edu? --      Excl. in GC? --    No data found.  Updated Vital Signs BP 110/64 (BP Location: Left Arm)   Pulse 75   Temp 98.7 F (37.1 C) (Oral)   Resp 16   LMP 02/14/2018 (Approximate)   SpO2 100%   Visual Acuity Right Eye Distance:   Left Eye Distance:   Bilateral Distance:    Right Eye Near:   Left Eye Near:    Bilateral Near:     Physical Exam  Constitutional: She appears well-developed and well-nourished. No distress.  HENT:  Head: Normocephalic and atraumatic.  Bilateral ears without tenderness to palpation of external auricle, tragus and mastoid, EAC's without erythema or swelling, TM's with good bony landmarks and cone of light. Non erythematous.  Oral mucosa pink and moist, no tonsillar enlargement or exudate. Posterior pharynx patent and  erythematous-cobblestoning present, no uvula deviation or swelling. Normal phonation.  Eyes: Conjunctivae are normal.  Neck: Neck supple.  Cardiovascular: Normal rate and regular rhythm.  No murmur heard. Pulmonary/Chest: Effort normal and breath sounds normal. No respiratory distress.  Breathing comfortably at rest, CTABL, no wheezing, rales or other adventitious sounds auscultated  Abdominal: Soft. There is no tenderness.  Musculoskeletal: She exhibits no edema.  Neurological: She is alert.  Skin: Skin is warm and dry.  Psychiatric: She has a normal mood and affect.  Nursing note and vitals reviewed.    UC Treatments / Results  Labs (all labs ordered are listed, but only abnormal results are displayed) Labs Reviewed - No data to display  EKG None  Radiology No results found.  Procedures Procedures (including critical care time)  Medications Ordered in UC Medications - No data to display  Initial Impression / Assessment and Plan / UC Course  I have reviewed the triage vital signs and the nursing notes.  Pertinent labs & imaging results that were available during my care of the patient were reviewed by me and considered in my medical decision making (see chart for details).     URI symptoms x3 to 4 days, vital signs stable, no fever, tachycardia, O2 100%.  Lungs CTA BL.  Most likely viral etiology versus postnasal drainage.  Will recommend symptomatic management.  Zyrtec daily, provided Naprosyn as she has had improvement with this, refilled inhaler.  Also provided Robitussin with codeine for patient to use at nighttime to help with throat and cough.  Discussed drowsiness regarding this medicine advised to only use at home, do not work or drive after using.Discussed strict return precautions. Patient verbalized understanding and is agreeable with plan.  Final Clinical Impressions(s) / UC Diagnoses   Final diagnoses:  Viral URI with cough     Discharge Instructions      Your symptoms are most likely viral, this should improve on its own over the next week Please begin daily Zyrtec/cetirizine to help with drainage contributing to sore throat May use cough syrup provided when at home or bedtime to help with throat discomfort as well as cough Naprosyn as needed twice daily I have refilled your inhaler-use as needed for any shortness of breath, wheezing or chest tightness Please drink plenty of fluids  Please follow-up if not having any improvement in your symptoms are cough over the next week, follow-up sooner if having worsening chest discomfort, shortness of breath, fevers, difficulty breathing   ED Prescriptions    Medication Sig Dispense Auth. Provider   Cetirizine HCl 10 MG CAPS Take 1 capsule (10 mg total) by mouth daily for 10 days. 10 capsule Tinaya Ceballos C, PA-C   albuterol (PROVENTIL HFA;VENTOLIN HFA) 108 (90 Base) MCG/ACT inhaler Inhale 1-2 puffs into the lungs every 6 (six) hours as needed for wheezing or shortness of breath. 1 Inhaler Kaison Mcparland C, PA-C   guaiFENesin-codeine 100-10 MG/5ML syrup Take 5 mLs by mouth 3 (three) times daily as needed for cough. Do not drive or work after using 120 mL Nijae Doyel C, PA-C   naproxen (NAPROSYN) 500 MG tablet Take 1 tablet (500 mg total) by mouth 2 (two) times daily. 30 tablet Shannie Kontos, Muir C, PA-C     Controlled Substance Prescriptions [place] Controlled Substance Registry consulted? Not Applicable   Lew Dawes, [place] 02/14/18 1317

## 2018-02-14 NOTE — Discharge Instructions (Signed)
Your symptoms are most likely viral, this should improve on its own over the next week Please begin daily Zyrtec/cetirizine to help with drainage contributing to sore throat May use cough syrup provided when at home or bedtime to help with throat discomfort as well as cough Naprosyn as needed twice daily I have refilled your inhaler-use as needed for any shortness of breath, wheezing or chest tightness Please drink plenty of fluids  Please follow-up if not having any improvement in your symptoms are cough over the next week, follow-up sooner if having worsening chest discomfort, shortness of breath, fevers, difficulty breathing

## 2018-02-18 ENCOUNTER — Ambulatory Visit: Payer: Self-pay | Admitting: Family Medicine

## 2018-02-18 ENCOUNTER — Encounter

## 2018-09-10 DIAGNOSIS — Z13220 Encounter for screening for lipoid disorders: Secondary | ICD-10-CM | POA: Diagnosis not present

## 2018-09-10 DIAGNOSIS — Z1389 Encounter for screening for other disorder: Secondary | ICD-10-CM | POA: Diagnosis not present

## 2018-09-10 DIAGNOSIS — Z01419 Encounter for gynecological examination (general) (routine) without abnormal findings: Secondary | ICD-10-CM | POA: Diagnosis not present

## 2018-09-10 DIAGNOSIS — Z6830 Body mass index (BMI) 30.0-30.9, adult: Secondary | ICD-10-CM | POA: Diagnosis not present

## 2018-09-10 DIAGNOSIS — N920 Excessive and frequent menstruation with regular cycle: Secondary | ICD-10-CM | POA: Diagnosis not present

## 2018-09-10 DIAGNOSIS — Z1329 Encounter for screening for other suspected endocrine disorder: Secondary | ICD-10-CM | POA: Diagnosis not present

## 2018-09-10 DIAGNOSIS — Z1151 Encounter for screening for human papillomavirus (HPV): Secondary | ICD-10-CM | POA: Diagnosis not present

## 2018-09-10 DIAGNOSIS — Z0000 Encounter for general adult medical examination without abnormal findings: Secondary | ICD-10-CM | POA: Diagnosis not present

## 2018-09-12 DIAGNOSIS — Z1231 Encounter for screening mammogram for malignant neoplasm of breast: Secondary | ICD-10-CM | POA: Diagnosis not present

## 2018-09-26 DIAGNOSIS — N920 Excessive and frequent menstruation with regular cycle: Secondary | ICD-10-CM | POA: Diagnosis not present

## 2018-11-12 DIAGNOSIS — H04123 Dry eye syndrome of bilateral lacrimal glands: Secondary | ICD-10-CM | POA: Diagnosis not present

## 2018-11-12 DIAGNOSIS — H52203 Unspecified astigmatism, bilateral: Secondary | ICD-10-CM | POA: Diagnosis not present

## 2019-01-23 DIAGNOSIS — D649 Anemia, unspecified: Secondary | ICD-10-CM | POA: Diagnosis not present

## 2019-01-23 DIAGNOSIS — E559 Vitamin D deficiency, unspecified: Secondary | ICD-10-CM | POA: Diagnosis not present

## 2019-01-23 DIAGNOSIS — B373 Candidiasis of vulva and vagina: Secondary | ICD-10-CM | POA: Diagnosis not present

## 2019-02-04 ENCOUNTER — Emergency Department (HOSPITAL_COMMUNITY)
Admission: EM | Admit: 2019-02-04 | Discharge: 2019-02-04 | Disposition: A | Discharge status: Home / Self Care | Payer: BC Managed Care – PPO | Attending: Emergency Medicine | Admitting: Emergency Medicine

## 2019-02-04 ENCOUNTER — Other Ambulatory Visit: Payer: Self-pay

## 2019-02-04 ENCOUNTER — Encounter (HOSPITAL_COMMUNITY): Payer: Self-pay | Admitting: *Deleted

## 2019-02-04 DIAGNOSIS — L292 Pruritus vulvae: Secondary | ICD-10-CM | POA: Diagnosis not present

## 2019-02-04 DIAGNOSIS — J029 Acute pharyngitis, unspecified: Secondary | ICD-10-CM | POA: Diagnosis not present

## 2019-02-04 DIAGNOSIS — Z79899 Other long term (current) drug therapy: Secondary | ICD-10-CM | POA: Diagnosis not present

## 2019-02-04 LAB — WET PREP, GENITAL
Clue Cells Wet Prep HPF POC: NONE SEEN
Sperm: NONE SEEN
Trich, Wet Prep: NONE SEEN
Yeast Wet Prep HPF POC: NONE SEEN

## 2019-02-04 LAB — GROUP A STREP BY PCR: Group A Strep by PCR: NOT DETECTED

## 2019-02-04 MED ORDER — OXYCODONE-ACETAMINOPHEN 5-325 MG PO TABS
1.0000 | ORAL_TABLET | Freq: Once | ORAL | Status: AC
  Administered 2019-02-04: 12:00:00 1 via ORAL
  Filled 2019-02-04: qty 1

## 2019-02-04 MED ORDER — ACYCLOVIR 400 MG PO TABS: 400.0000 mg | ORAL_TABLET | Freq: Three times a day (TID) | ORAL | 0 refills | Status: DC

## 2019-02-04 MED ORDER — AZITHROMYCIN 250 MG PO TABS
1000.0000 mg | ORAL_TABLET | Freq: Once | ORAL | Status: AC
  Administered 2019-02-04: 12:00:00 1000 mg via ORAL
  Filled 2019-02-04: qty 4

## 2019-02-04 MED ORDER — HYDROCODONE-ACETAMINOPHEN 5-325 MG PO TABS: 1.0000 | ORAL_TABLET | Freq: Four times a day (QID) | ORAL | 0 refills | Status: DC | PRN

## 2019-02-04 MED ORDER — BENZONATATE 100 MG PO CAPS: 100.0000 mg | ORAL_CAPSULE | Freq: Three times a day (TID) | ORAL | 0 refills | Status: DC

## 2019-02-04 MED ORDER — LIDOCAINE HCL (PF) 1 % IJ SOLN
INTRAMUSCULAR | Status: AC
  Administered 2019-02-04: 5 mL
  Filled 2019-02-04: qty 5

## 2019-02-04 MED ORDER — CEFTRIAXONE SODIUM 250 MG IJ SOLR
250.0000 mg | Freq: Once | INTRAMUSCULAR | Status: AC
  Administered 2019-02-04: 12:00:00 250 mg via INTRAMUSCULAR
  Filled 2019-02-04: qty 250

## 2019-02-04 NOTE — ED Triage Notes (Signed)
Pt c/o sore throat and fever unrelieved with tylenol. Also reports being put on medications for vaginal itching last week that hasn't improved. Received flu shot two days ago

## 2019-02-04 NOTE — ED Notes (Addendum)
Took tylenol 2 hours ago (500mg  x2 pe pt)

## 2019-02-04 NOTE — ED Notes (Signed)
Patient verbalizes understanding of discharge instructions . Opportunity for questions and answers were provided . Armband removed by staff ,Pt discharged from ED. W/C  offered at D/C  and Declined W/C at D/C and was escorted to lobby by RN.  

## 2019-02-04 NOTE — ED Provider Notes (Signed)
[facility] Provider Note   CSN: [id number] Arrival date & time: 02/04/19  0344     History   Chief Complaint Chief Complaint  Patient presents with  . Sore Throat  . Vaginal Itching    HPI Brenda Bell is a 48 y.o. female.     HPI    48 year old female presents today with complaints of sore throat.  Patient notes approximately 1 week ago she developed soreness to her throat and lips.  She notes some ulcerations and irritation to her bilateral upper and lower lips.  She notes painful swallowing but notes she is able to swallow, she notes fever over the last several days.  She denies any new medications prior to symptoms starting.  She does note that she was seen by her primary care and started on fluconazole for vaginal itching, but notes that this was after her symptoms started.  She denies any lesions to the remainder of her skin, denies any close sick contacts.  She notes taking penicillin that she received from [place] yesterday which she feels improved her symptoms.   Past Medical History:  Diagnosis Date  . Anemia   . B12 deficiency   . Gastritis   . Vitamin D deficiency     Patient Active Problem List   Diagnosis Date Noted  . Acute appendicitis 10/26/2016  . Appendicitis 10/26/2016  . Anemia, iron deficiency 08/20/2013  . Periodic health assessment, general screening, adult 01/29/2013  . SKIN RASH 12/19/2009  . FURUNCLE 05/26/2009  . PHARYNGITIS, STREPTOCOCCAL 08/11/2008  . VIRAL URI 05/25/2008  . COSTOCHONDRITIS, LEFT 11/19/2007  . FATIGUE 08/12/2007  . ANEMIA, IRON DEFICIENCY 02/02/2007  . ALLERGIC RHINITIS 01/17/2007  . ASTHMA 12/30/2006  . GERD 12/30/2006  . 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 LUMP OR MASS IN BREAST 11/06/2005  . 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 BREAST SURGERY Left    cyst removed  . CESAREAN SECTION    . CHOLECYSTECTOMY    . KNEE SURGERY  Right    tendon repair  . LAPAROSCOPIC APPENDECTOMY N/A 10/26/2016   Procedure: APPENDECTOMY LAPAROSCOPIC;  Surgeon: Clovis Riley, MD;  Location: [facility];  Service: General;  Laterality: N/A;  . 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 TONSILLECTOMY Bilateral      OB History    Gravida  3   Para  3   Term  3   Preterm      AB      Living  3     SAB      TAB      Ectopic      Multiple      Live Births               Home Medications    Prior to Admission medications   Medication Sig Start Date End Date Taking? Authorizing Provider  acyclovir (ZOVIRAX) 400 MG tablet Take 1 tablet (400 mg total) by mouth 3 (three) times daily. 02/04/19   Nile Dorning, Dellis Filbert, PA-C  albuterol (PROVENTIL HFA;VENTOLIN HFA) 108 (90 Base) MCG/ACT inhaler Inhale 1-2 puffs into the lungs every 6 (six) hours as needed for wheezing or shortness of breath. 02/14/18   Wieters, Hallie C, PA-C  benzonatate (TESSALON) 100 MG capsule Take 1 capsule (100 mg total) by mouth every 8 (eight) hours. 02/04/19   Okey Regal, PA-C  Cetirizine HCl 10 MG CAPS Take 1 capsule (10 mg total) by mouth daily for 10 days. 02/14/18 02/24/18  Wieters, Hallie C, PA-C  guaiFENesin-codeine 100-10 MG/5ML syrup Take 5 mLs by mouth 3 (three) times daily as needed for cough. Do not drive or work after using 02/14/18   Wieters, [employer] C, PA-C  HYDROcodone-acetaminophen (NORCO/VICODIN) 5-325 MG tablet Take 1 tablet by mouth every 6 (six) hours as needed. 02/04/19   Winner Valeriano, Tinnie Gens, PA-C  ibuprofen (ADVIL,MOTRIN) 200 MG tablet You can take 2 tablets every 4 hours as needed for pain.  Follow package instructions. 10/27/16   Sherrie George, PA-C  ibuprofen (ADVIL,MOTRIN) 600 MG tablet Take 1 tablet (600 mg total) by mouth every 6 (six) hours as needed. 02/26/17   Arby Barrette, MD  Methenamine-Sodium Salicylate (CYSTEX PO) Take 2 tablets by mouth every 4 (four) hours as needed  (frequent urination, pressure).    [provider]  naproxen (NAPROSYN) 500 MG tablet Take 1 tablet (500 mg total) by mouth 2 (two) times daily. 02/14/18   Wieters, Junius Creamer, PA-C    Family History Family History  Problem Relation Age of Onset  . Hypertension Mother   . Diabetes Father     Social History Social History   Tobacco Use  . Smoking status: Never Smoker  . Smokeless tobacco: Never Used  Substance Use Topics  . Alcohol use: No    Alcohol/week: 0.0 standard drinks  . Drug use: No     Allergies   Patient has no known allergies.   Review of Systems Review of Systems  All other systems reviewed and are negative.    Physical Exam Updated Vital Signs BP 123/82   Pulse (!) 112   Temp (!) 100.5 F (38.1 C) (Oral)   Resp 16   SpO2 100%   Physical Exam Vitals signs and nursing note reviewed.  Constitutional:      Appearance: She is well-developed.  HENT:     Head: Normocephalic and atraumatic.     Comments: Erythema noted to the oropharynx with several small ulcerations to the mucous membranes of the lip and oropharynx Eyes:     General: No scleral icterus.       Right eye: No discharge.        Left eye: No discharge.     Conjunctiva/sclera: Conjunctivae normal.     Pupils: Pupils are equal, round, and reactive to light.  Neck:     Musculoskeletal: Normal range of motion.     Vascular: No JVD.     Trachea: No tracheal deviation.  Pulmonary:     Effort: Pulmonary effort is normal. No respiratory distress.     Breath sounds: Normal breath sounds. No stridor. No wheezing, rhonchi or rales.  Genitourinary:    Comments: Thin yellowish vaginal discharge, no cervical motion tenderness no lesions in the vaginal vault or surrounding tissue Neurological:     Mental Status: She is alert and oriented to person, place, and time.     Coordination: Coordination normal.  Psychiatric:        Behavior: Behavior normal.        Thought Content: Thought  content normal.        Judgment: Judgment normal.     ED Treatments / Results  Labs (all labs ordered are listed, but only abnormal results are displayed) Labs Reviewed  WET PREP, GENITAL - Abnormal; Notable for the following components:      Result Value   WBC, Wet Prep HPF  POC MANY (*)    All other components within normal limits  GROUP A STREP BY PCR  GC[facility]CHLAMYDIA [facility] AMP [facility]    EKG None  Radiology No results found.  Procedures Procedures (including critical care time)  Medications Ordered in ED Medications  cefTRIAXone (ROCEPHIN) injection 250 mg (250 mg Intramuscular Given 02/04/19 1227)  azithromycin (ZITHROMAX) tablet 1,000 mg (1,000 mg Oral Given 02/04/19 1225)  oxyCODONE-acetaminophen (PERCOCET/ROXICET) 5-325 MG per tablet 1 tablet (1 tablet Oral Given 02/04/19 1224)  lidocaine (PF) (XYLOCAINE) 1 % injection (5 mLs  Given 02/04/19 1227)     Initial Impression / Assessment and Plan / ED Course  I have reviewed the triage vital signs and the nursing notes.  Pertinent labs & imaging results that were available during my care of the patient were reviewed by me and considered in my medical decision making (see chart for details).         Assessment/Plan: 48 year old female presents today with several complaints.  Patient has what appears to be stomatitis, this appears to be viral in nature, question herpangina versus herpes simplex gingival stomatitis.  She has no other skin involvement, no signs of SJS or any other acute life-threatening etiology.  She has no involvement of the hands, does not appear to be hand-foot-and-mouth disease.  I will treat her with pain medication and antivirals for this.  There does not appear to be a bacterial component to this.  She does have vaginal discharge that appears purulent in nature.  She has tried antifungals without improvement in her symptoms.  Will cover for gonorrhea and chlamydia.  Patient denied any  sort of respiratory component to this, but is requesting cough medication, I have not heard any coughing throughout my exam.  Patient will encouraged to follow-up with her primary care provider in 2 days for reassessment return immediately if she develops any new or worsening signs or symptoms.  She verbalized understanding and agreement to today's plan had no further questions or concerns at time of discharge.  In-house Spanish translator was used   Final Clinical Impressions(s) / ED Diagnoses   Final diagnoses:  Pharyngitis, unspecified etiology    ED Discharge Orders         Ordered    benzonatate (TESSALON) 100 MG capsule  Every 8 hours,   Status:  Discontinued     02/04/19 1232    acyclovir (ZOVIRAX) 400 MG tablet  3 times daily     02/04/19 1234    benzonatate (TESSALON) 100 MG capsule  Every 8 hours     02/04/19 1234    HYDROcodone-acetaminophen (NORCO/VICODIN) 5-325 MG tablet  Every 6 hours PRN     02/04/19 1234           Eyvonne MechanicHedges, Chanise Habeck, PA-C 02/04/19 1239    Tegeler, Canary Brimhristopher J, MD 02/04/19 1313

## 2019-02-04 NOTE — Discharge Instructions (Signed)
Please read attached information. If you experience any new or worsening signs or symptoms please return to the emergency room for evaluation. Please follow-up with your primary care provider or specialist as discussed. Please use medication prescribed only as directed and discontinue taking if you have any concerning signs or symptoms.   °

## 2019-02-05 DIAGNOSIS — R509 Fever, unspecified: Secondary | ICD-10-CM | POA: Diagnosis not present

## 2019-02-05 DIAGNOSIS — R000 Tachycardia, unspecified: Secondary | ICD-10-CM | POA: Diagnosis not present

## 2019-02-05 DIAGNOSIS — I361 Nonrheumatic tricuspid (valve) insufficiency: Secondary | ICD-10-CM | POA: Diagnosis not present

## 2019-02-05 DIAGNOSIS — J157 Pneumonia due to Mycoplasma pneumoniae: Secondary | ICD-10-CM | POA: Diagnosis not present

## 2019-02-05 DIAGNOSIS — A419 Sepsis, unspecified organism: Secondary | ICD-10-CM | POA: Diagnosis not present

## 2019-02-05 DIAGNOSIS — B370 Candidal stomatitis: Secondary | ICD-10-CM | POA: Diagnosis not present

## 2019-02-05 DIAGNOSIS — B3781 Candidal esophagitis: Secondary | ICD-10-CM | POA: Diagnosis not present

## 2019-02-05 DIAGNOSIS — R739 Hyperglycemia, unspecified: Secondary | ICD-10-CM | POA: Diagnosis not present

## 2019-02-05 DIAGNOSIS — J189 Pneumonia, unspecified organism: Secondary | ICD-10-CM | POA: Diagnosis not present

## 2019-02-05 DIAGNOSIS — N3001 Acute cystitis with hematuria: Secondary | ICD-10-CM | POA: Diagnosis not present

## 2019-02-05 DIAGNOSIS — R17 Unspecified jaundice: Secondary | ICD-10-CM | POA: Diagnosis not present

## 2019-02-05 DIAGNOSIS — E876 Hypokalemia: Secondary | ICD-10-CM | POA: Diagnosis not present

## 2019-02-05 DIAGNOSIS — L519 Erythema multiforme, unspecified: Secondary | ICD-10-CM | POA: Diagnosis not present

## 2019-02-05 DIAGNOSIS — K120 Recurrent oral aphthae: Secondary | ICD-10-CM | POA: Diagnosis not present

## 2019-02-05 DIAGNOSIS — K1370 Unspecified lesions of oral mucosa: Secondary | ICD-10-CM | POA: Diagnosis not present

## 2019-02-05 DIAGNOSIS — N390 Urinary tract infection, site not specified: Secondary | ICD-10-CM | POA: Diagnosis not present

## 2019-02-05 DIAGNOSIS — E871 Hypo-osmolality and hyponatremia: Secondary | ICD-10-CM | POA: Diagnosis not present

## 2019-02-05 DIAGNOSIS — B9789 Other viral agents as the cause of diseases classified elsewhere: Secondary | ICD-10-CM | POA: Diagnosis not present

## 2019-02-05 DIAGNOSIS — I517 Cardiomegaly: Secondary | ICD-10-CM | POA: Diagnosis not present

## 2019-02-05 DIAGNOSIS — J45909 Unspecified asthma, uncomplicated: Secondary | ICD-10-CM | POA: Diagnosis not present

## 2019-02-05 DIAGNOSIS — Z20828 Contact with and (suspected) exposure to other viral communicable diseases: Secondary | ICD-10-CM | POA: Diagnosis not present

## 2019-02-05 DIAGNOSIS — K121 Other forms of stomatitis: Secondary | ICD-10-CM | POA: Diagnosis not present

## 2019-02-05 DIAGNOSIS — K1379 Other lesions of oral mucosa: Secondary | ICD-10-CM | POA: Diagnosis not present

## 2019-02-05 DIAGNOSIS — E860 Dehydration: Secondary | ICD-10-CM | POA: Diagnosis not present

## 2019-02-05 DIAGNOSIS — B085 Enteroviral vesicular pharyngitis: Secondary | ICD-10-CM | POA: Diagnosis not present

## 2019-02-05 LAB — GC/CHLAMYDIA PROBE AMP (~~LOC~~) NOT AT ARMC
Chlamydia: NEGATIVE
Neisseria Gonorrhea: NEGATIVE

## 2019-02-13 MED ORDER — SENNOSIDES-DOCUSATE SODIUM 8.6-50 MG PO TABS
1.00 | ORAL_TABLET | ORAL | Status: DC
Start: ? — End: 2019-02-13

## 2019-02-13 MED ORDER — GENERIC EXTERNAL MEDICATION
500.00 | Status: DC
Start: 2019-02-11 — End: 2019-02-13

## 2019-02-13 MED ORDER — PREDNISONE 20 MG PO TABS
40.00 | ORAL_TABLET | ORAL | Status: DC
Start: 2019-02-12 — End: 2019-02-13

## 2019-02-13 MED ORDER — ONDANSETRON HCL 4 MG/2ML IJ SOLN
4.00 | INTRAMUSCULAR | Status: DC
Start: ? — End: 2019-02-13

## 2019-02-13 MED ORDER — SILVER SULFADIAZINE 1 % EX CREA
TOPICAL_CREAM | CUTANEOUS | Status: DC
Start: ? — End: 2019-02-13

## 2019-02-13 MED ORDER — GENERIC EXTERNAL MEDICATION
5.00 | Status: DC
Start: 2019-02-11 — End: 2019-02-13

## 2019-02-13 MED ORDER — ENOXAPARIN SODIUM 40 MG/0.4ML ~~LOC~~ SOLN
40.00 | SUBCUTANEOUS | Status: DC
Start: 2019-02-11 — End: 2019-02-13

## 2019-02-13 MED ORDER — PANTOPRAZOLE SODIUM 40 MG PO TBEC
40.00 | DELAYED_RELEASE_TABLET | ORAL | Status: DC
Start: ? — End: 2019-02-13

## 2019-02-13 MED ORDER — GENERIC EXTERNAL MEDICATION
5.00 | Status: DC
Start: ? — End: 2019-02-13

## 2019-02-13 MED ORDER — GENERIC EXTERNAL MEDICATION
30.00 | Status: DC
Start: 2019-02-11 — End: 2019-02-13

## 2019-02-13 MED ORDER — KETOROLAC TROMETHAMINE 30 MG/ML IJ SOLN
30.00 | INTRAMUSCULAR | Status: DC
Start: ? — End: 2019-02-13

## 2019-02-13 MED ORDER — MORPHINE SULFATE (PF) 2 MG/ML IJ SOLN
2.00 | INTRAMUSCULAR | Status: DC
Start: ? — End: 2019-02-13

## 2019-02-13 MED ORDER — ALBUTEROL SULFATE (2.5 MG/3ML) 0.083% IN NEBU
2.50 | INHALATION_SOLUTION | RESPIRATORY_TRACT | Status: DC
Start: ? — End: 2019-02-13

## 2019-02-13 MED ORDER — ACETAMINOPHEN 325 MG PO TABS
650.00 | ORAL_TABLET | ORAL | Status: DC
Start: ? — End: 2019-02-13

## 2019-02-13 MED ORDER — NYSTATIN 100000 UNIT/ML MT SUSP
500000.00 | OROMUCOSAL | Status: DC
Start: 2019-02-11 — End: 2019-02-13

## 2019-02-13 MED ORDER — AZITHROMYCIN 250 MG PO TABS
500.00 | ORAL_TABLET | ORAL | Status: DC
Start: 2019-02-12 — End: 2019-02-13

## 2019-02-13 MED ORDER — BENZONATATE 100 MG PO CAPS
100.00 | ORAL_CAPSULE | ORAL | Status: DC
Start: ? — End: 2019-02-13

## 2019-02-13 MED ORDER — GENERIC EXTERNAL MEDICATION
1.00 | Status: DC
Start: 2019-02-12 — End: 2019-02-13

## 2019-02-16 NOTE — Progress Notes (Signed)
Patient ID: Brenda Bell, female   DOB: 04/09/1970, 48 y.o.   MRN: [id number] Virtual Visit via Telephone Note  I connected with Brenda Bell on 02/18/19 at  2:50 PM EST by telephone and verified that I am speaking with the correct person using two identifiers.   I discussed the limitations, risks, security and privacy concerns of performing an evaluation and management service by telephone and the availability of in person appointments. I also discussed with the patient that there may be a patient responsible charge related to this service. The patient expressed understanding and agreed to proceed.  Patient location:  home My Location:  [facility] office Persons on the call:  Me, interpreter(Mateo), and the patient  History of Present Illness: After hospitalization for pneumonia and erythema multiforme 11/5-02/2019.   No cough.  No fever.  Vaginal lesions improving.  Rash improving.  Doing well.  Appetite is good.  No questions or concerns.  No abdominal pain.  Admit date: 02/05/2019  Discharge date: 02/11/2019   [facility]  Discharge Attending Physician:Eshwar Laveda Norman, MD  Discharge to: Home  Discharge Diagnoses: Principal Problem (Resolved): Sepsis secondary to UTI [facility]) Active Problems: Cough Aphthous stomatitis Thrush Odynophagia Resolved Problems: Fever Cystitis with hematuria Hyponatremia Hyperglycemia Hyperbilirubinemia Hypophosphatemia  Hospital Course:  48 y.o.femalewith PMH ofAsthma, recurrent labial lesions possible stomatitis presents to ED with complaints of oral pain, fever and chills, nausea vomiting, burning in urination and burning pain in groin. Patient was seen in [facility] ED on 11/4 for sore throat and pain in lips and mouth with odynophagia. Patient developed sore throat on 10/1 after eating. Does endorse loss of taste. The ED patient was febrile 38.8 Celsius, heart rate tachycardic, UA blood leukocyte esterase and WBC  positive. Mono and strep negative. Flu negative. COVID-19 test ordered which came back negative. Chest x-ray showed opacity in right lung base concerning for early infiltrate and pneumonia. We have been taking care of following issues,  Sepsis ruled out. Cultures so far remain negative. Sepsis parameters improving.  Community-acquired pneumonia, presumably bacterial, POA. Chest x-ray consistent with opacity at right base concerning for the pneumonia/early infiltrate. Patient has been started on IV antibiotics including Rocephin and Zithromax. Blood cultures/other cultures so far no growth. Fever curve trending down. Plan is to discharge patient on Omnicef and Zithromax for 5 more days to complete a 10 days course of complicated pneumonia with skin manifestations. Presumed mycoplasma pneumonia pneumonia presenting with pneumonia and skin manifestation of erythema multiform major.  Erythema multiforme major, exacerbated by pneumonia, slowly improving. Patient is still significantly symptomatic with severe stomatitis, blistering labial lesions and sore throat. Patient also has maculopapular lesions over the palms. No new skin involvement reported. My colleague physician Dr. Dwyane Dee consulted dermatology and discussed the case with on-call dermatology at [facility] and they believe these lesions are erythema multiform major from pneumonia. Recommended to discontinue acyclovir. Recommended to start prednisone 40 mg daily for 4 days and if lesions improve then start tapering the steroids. Still her lesions are significantly bad so we will continue 40 mg steroids daily.Also recommended silver sulfadiazine cream to vaginal lesions. Continue antibiotics. Follow-up pending cultures. So far cultures negative. Pending Mycoplasma pneumoniae antibodies. Plan is to discharge the patient on oral antibiotics and tapering dose of steroids.  Hypophosphatemia/hypokalemia. Monitor closely and  replace as needed.  Ruled out UTI. Urine culture negative.   Patient is overall stable for the discharge.  Post Discharge Follow Up Issues:  Patient needs to follow with the [facility] and we will arrange appointment. Pending Mycoplasma pneumoniae antibodies.      Observations/Objective:  NAD/  A&Ox3   Assessment and Plan: 1. Pneumonia due to infectious organism, unspecified laterality, unspecified part of lung Completed antibiotics and recent prednisone - CBC with Differential; Future  2. Erythema multiforme resolving - CBC with Differential; Future  3. Hospital discharge follow-up Much improved  4. Abnormal LFTs - CBC with Differential; Future  5. Hypophosphatemia - Comprehensive metabolic panel; Future  6. Iron deficiency anemia, unspecified iron deficiency anemia type - CBC with Differential; Future  7. Hypokalemia - Comprehensive metabolic panel; Future  8. Glucose intolerance/hyperglycemia - Hemoglobin A1c; Future Recent prednisone I have had a lengthy discussion and provided education about insulin resistance and the intake of too much sugar/refined carbohydrates.  I have advised the patient to work at a goal of eliminating sugary drinks, candy, desserts, sweets, refined sugars, processed foods, and white carbohydrates.  The patient expresses understanding.   9. Language barrier pacific interpreters used and additional time performing visit was required.      Follow Up Instructions: Assign pcp in ~4 weeks;  Sooner if needed   I discussed the assessment and treatment plan with the patient. The patient was provided an opportunity to ask questions and all were answered. The patient agreed with the plan and demonstrated an understanding of the instructions.   The patient was advised to call back or seek an in-person evaluation if the symptoms worsen or if the condition fails to improve as anticipated.  I provided 16 minutes of non-face-to-face time during this  encounter.   Freeman Caldron, PA-C

## 2019-02-18 ENCOUNTER — Ambulatory Visit (INDEPENDENT_AMBULATORY_CARE_PROVIDER_SITE_OTHER): Payer: BC Managed Care – PPO | Admitting: Physician Assistant

## 2019-02-18 DIAGNOSIS — L519 Erythema multiforme, unspecified: Secondary | ICD-10-CM | POA: Diagnosis not present

## 2019-02-18 DIAGNOSIS — J189 Pneumonia, unspecified organism: Secondary | ICD-10-CM

## 2019-02-18 DIAGNOSIS — R7989 Other specified abnormal findings of blood chemistry: Secondary | ICD-10-CM

## 2019-02-18 DIAGNOSIS — D509 Iron deficiency anemia, unspecified: Secondary | ICD-10-CM

## 2019-02-18 DIAGNOSIS — Z09 Encounter for follow-up examination after completed treatment for conditions other than malignant neoplasm: Secondary | ICD-10-CM

## 2019-02-18 DIAGNOSIS — R945 Abnormal results of liver function studies: Secondary | ICD-10-CM

## 2019-02-18 DIAGNOSIS — E876 Hypokalemia: Secondary | ICD-10-CM

## 2019-02-18 DIAGNOSIS — E7439 Other disorders of intestinal carbohydrate absorption: Secondary | ICD-10-CM

## 2019-02-18 MED ORDER — PANTOPRAZOLE SODIUM 40 MG PO TBEC: 40.0000 mg | DELAYED_RELEASE_TABLET | Freq: Every day | ORAL | 0 refills | Status: DC

## 2019-02-18 NOTE — Progress Notes (Signed)
Following up on hospital visit. Still having SHOB, sore throat, cough & fatigue.  Concerned about possible hemorrhoids. Hasn't used anything OTC.

## 2019-03-04 ENCOUNTER — Other Ambulatory Visit: Payer: BC Managed Care – PPO

## 2019-03-04 ENCOUNTER — Other Ambulatory Visit: Payer: Self-pay

## 2019-03-04 DIAGNOSIS — L519 Erythema multiforme, unspecified: Secondary | ICD-10-CM

## 2019-03-04 DIAGNOSIS — E7439 Other disorders of intestinal carbohydrate absorption: Secondary | ICD-10-CM | POA: Diagnosis not present

## 2019-03-04 DIAGNOSIS — R945 Abnormal results of liver function studies: Secondary | ICD-10-CM

## 2019-03-04 DIAGNOSIS — D509 Iron deficiency anemia, unspecified: Secondary | ICD-10-CM

## 2019-03-04 DIAGNOSIS — E876 Hypokalemia: Secondary | ICD-10-CM | POA: Diagnosis not present

## 2019-03-04 DIAGNOSIS — J189 Pneumonia, unspecified organism: Secondary | ICD-10-CM | POA: Diagnosis not present

## 2019-03-04 DIAGNOSIS — R7989 Other specified abnormal findings of blood chemistry: Secondary | ICD-10-CM

## 2019-03-04 NOTE — Progress Notes (Signed)
Patient here for labs ordered during televisit. 

## 2019-03-05 LAB — COMPREHENSIVE METABOLIC PANEL
ALT: 41 IU/L — ABNORMAL HIGH (ref 0–32)
AST: 34 IU/L (ref 0–40)
Albumin/Globulin Ratio: 1.3 (ref 1.2–2.2)
Albumin: 4 g/dL (ref 3.8–4.8)
Alkaline Phosphatase: 77 IU/L (ref 39–117)
BUN/Creatinine Ratio: 14 (ref 9–23)
BUN: 7 mg/dL (ref 6–24)
Bilirubin Total: 0.4 mg/dL (ref 0.0–1.2)
CO2: 23 mmol/L (ref 20–29)
Calcium: 8.6 mg/dL — ABNORMAL LOW (ref 8.7–10.2)
Chloride: 106 mmol/L (ref 96–106)
Creatinine, Ser: 0.49 mg/dL — ABNORMAL LOW (ref 0.57–1.00)
GFR calc Af Amer: 135 mL/min/{1.73_m2} (ref >59)
GFR calc non Af Amer: 117 mL/min/{1.73_m2} (ref >59)
Globulin, Total: 3 g/dL (ref 1.5–4.5)
Glucose: 89 mg/dL (ref 65–99)
Potassium: 3.9 mmol/L (ref 3.5–5.2)
Sodium: 139 mmol/L (ref 134–144)
Total Protein: 7 g/dL (ref 6.0–8.5)

## 2019-03-05 LAB — CBC WITH DIFFERENTIAL/PLATELET
Basophils Absolute: 0 10*3/uL (ref 0.0–0.2)
Basos: 1 %
EOS (ABSOLUTE): 0.2 10*3/uL (ref 0.0–0.4)
Eos: 7 %
Hematocrit: 36.1 % (ref 34.0–46.6)
Hemoglobin: 11.1 g/dL (ref 11.1–15.9)
Immature Grans (Abs): 0 10*3/uL (ref 0.0–0.1)
Immature Granulocytes: 0 %
Lymphocytes Absolute: 1.5 10*3/uL (ref 0.7–3.1)
Lymphs: 53 %
MCH: 27.5 pg (ref 26.6–33.0)
MCHC: 30.7 g/dL — ABNORMAL LOW (ref 31.5–35.7)
MCV: 89 fL (ref 79–97)
Monocytes Absolute: 0.4 10*3/uL (ref 0.1–0.9)
Monocytes: 15 %
Neutrophils Absolute: 0.7 10*3/uL — ABNORMAL LOW (ref 1.4–7.0)
Neutrophils: 24 %
Platelets: 219 10*3/uL (ref 150–450)
RBC: 4.04 x10E6/uL (ref 3.77–5.28)
RDW: 17 % — ABNORMAL HIGH (ref 11.7–15.4)
WBC: 2.8 10*3/uL — ABNORMAL LOW (ref 3.4–10.8)

## 2019-03-05 LAB — HEMOGLOBIN A1C
Est. average glucose Bld gHb Est-mCnc: 100 mg/dL
Hgb A1c MFr Bld: 5.1 % (ref 4.8–5.6)

## 2019-03-19 ENCOUNTER — Telehealth (INDEPENDENT_AMBULATORY_CARE_PROVIDER_SITE_OTHER): Payer: Self-pay

## 2019-03-19 NOTE — Telephone Encounter (Signed)
Call placed using pacific interpreter [phone]) patient verified date of birth. She is aware that all labs look good. Nat Christen, CMA

## 2019-03-19 NOTE — Telephone Encounter (Signed)
-----   Message from Argentina Donovan, [place] sent at 03/05/2019  1:24 PM EST ----- Please call patient.  Labs look good.  Follow-up as planned.  Thanks, Freeman Caldron, PA-C

## 2019-04-08 ENCOUNTER — Other Ambulatory Visit: Payer: Self-pay | Admitting: Physician Assistant

## 2019-04-16 ENCOUNTER — Ambulatory Visit: Payer: BC Managed Care – PPO

## 2019-05-19 ENCOUNTER — Ambulatory Visit: Payer: BC Managed Care – PPO | Admitting: Internal Medicine

## 2019-06-01 ENCOUNTER — Telehealth: Payer: Self-pay

## 2019-06-01 NOTE — Patient Instructions (Signed)
Thank you for choosing Primary Care at [facility] to be your medical home!    Brenda Bell was seen by Melina Schools, DO today.   Brenda Bell's primary care provider is Phill Myron, DO.   For the best care possible, you should try to see Phill Myron, DO whenever you come to the clinic.   We look forward to seeing you again soon!  If you have any questions about your visit today, please call us at [phone] or feel free to reach your primary care provider via Empire.     Prueba de Papanicolaou Pap Test Por qu me debo realizar esta prueba? La prueba de Papanicolaou, tambin denominada citologa vaginal, es una prueba de cribado para Hydrographic surveyor signos de:  Cncer de la vagina, del cuello uterino y del tero. El cuello uterino es la parte baja del tero que se abre hacia la vagina.  Infeccin.  Cambios que podran ser un signo de que se est desarrollando un cncer (cambios precancerosos). Las mujeres deben realizarse esta prueba con regularidad. En general, debe hacerse una prueba de Papanicolaou cada 3 aos hasta alcanzar la menopausia o hasta los 65 aos. Las [employer] 30 y 55 aos de edad pueden elegir realizarse la prueba de Papanicolaou al mismo tiempo que la prueba del VPH (virus del papiloma humano) cada 5 aos (en lugar de cada 3 aos). El mdico puede recomendarle que se realice pruebas de Papanicolaou con ms o menos frecuencia en funcin de sus afecciones mdicas y los resultados de la prueba de Papanicolaou anterior. Qu tipo de [place] se toma?  El mdico recolectar una muestra de clulas de la superficie del cuello uterino. Lo har utilizando un pequeo hisopo de algodn, una esptula de plstico o un cepillo. Esta muestra se recolecta durante un examen plvico, mientras usted est recostada boca arriba sobre la mesa de examen con los pies en los descansos para pies (estribos). En algunos casos, tambin pueden recolectarse fluidos  (secreciones) del cuello uterino y la vagina. Cmo debo prepararme para esta prueba?  Tenga en cuenta en qu etapa del ciclo menstrual se encuentra. Es posible que se le pida que vuelva a Risk manager la prueba si est Forensic psychologist en que debe Radiation protection practitioner.  Si el da en que debe realizarse la prueba tiene una infeccin vaginal aparente, deber volver a Editor, commissioning prueba.  Siga las indicaciones del mdico acerca de lo siguiente: ? Cambiar o suspender los medicamentos que toma habitualmente. Algunos medicamentos pueden [employer] de la prueba, como los digitlicos y Lexicographer. ? Evite las duchas vaginales o los baos de inmersin el da de la prueba o Games developer anterior. Informe al mdico acerca de lo siguiente:  Cualquier alergia que tenga.  Todos los [employer], incluidos vitaminas, hierbas, gotas oftlmicas, cremas y medicamentos de venta libre.  Cualquier enfermedad de la sangre que tenga.  Cirugas a las que se someti.  Cualquier afeccin mdica que tenga.  Si est embarazada o podra estarlo. Cmo se informan los resultados? Los [employer] de la prueba se informarn como anormales o normales. Puede producirse un resultado positivo falso. Este tipo de resultado es incorrecto porque indica que una enfermedad est presente cuando en realidad no lo est. Puede producirse un resultado negativo falso. Este tipo de resultado es incorrecto porque indica que una enfermedad no est presente cuando en realidad lo est. Qu significan los [place]? Un resultado normal en la prueba significa que no tiene signos de cncer  de la vagina, del cuello uterino o del tero. Un resultado anormal puede significar que tiene:  Cncer. Una prueba de Papanicolaou por s sola no es suficiente para Consulting civil engineer. En este caso, se le realizarn ms pruebas.  Cambios precancerosos en la vagina, cuello uterino o tero.  Inflamacin del cuello  uterino.  Enfermedades de transmisin sexual (ETS).  Infecciones por hongos.  Infecciones por parsitos. Hable con su mdico sobre lo que significan sus [place]. Preguntas para hacerle al mdico Consulte a su mdico o pregunte en el departamento donde se realiza la prueba acerca de lo siguiente:  Cundo estarn disponibles mis resultados?  Cmo obtendr mis resultados?  Cules son mis opciones de tratamiento?  Qu otras pruebas necesito?  Cules son los prximos pasos que debo seguir? Resumen  En general, las mujeres deben hacerse una prueba de Papanicolaou cada 3 aos Engineer, maintenance la menopausia o [employer] 65 aos de [place].  El mdico recolectar una muestra de clulas de la superficie del cuello uterino. Lo har utilizando un pequeo hisopo de algodn, una esptula de plstico o un cepillo.  En algunos casos, tambin pueden recolectarse fluidos (secreciones) del cuello uterino y la vagina. Esta informacin no tiene Theme park manager el consejo del mdico. Asegrese de hacerle al mdico cualquier pregunta que tenga. Document Revised: 02/26/2017 Document Reviewed: 02/26/2017 Elsevier Patient Education  2020 [employer].

## 2019-06-01 NOTE — Telephone Encounter (Signed)

## 2019-06-02 ENCOUNTER — Encounter: Payer: Self-pay | Admitting: Internal Medicine

## 2019-06-02 ENCOUNTER — Other Ambulatory Visit (HOSPITAL_COMMUNITY)
Admission: RE | Admit: 2019-06-02 | Discharge: 2019-06-02 | Disposition: A | Discharge status: Home / Self Care | Payer: BC Managed Care – PPO | Source: Ambulatory Visit | Attending: Internal Medicine | Admitting: Internal Medicine

## 2019-06-02 ENCOUNTER — Ambulatory Visit (INDEPENDENT_AMBULATORY_CARE_PROVIDER_SITE_OTHER): Payer: BC Managed Care – PPO | Admitting: Internal Medicine

## 2019-06-02 ENCOUNTER — Other Ambulatory Visit: Payer: Self-pay

## 2019-06-02 VITALS — BP 112/76 | HR 72 | Temp 97.2°F | Resp 17 | Ht 67.0 in | Wt 182.0 lb

## 2019-06-02 DIAGNOSIS — Z113 Encounter for screening for infections with a predominantly sexual mode of transmission: Secondary | ICD-10-CM

## 2019-06-02 DIAGNOSIS — B9689 Other specified bacterial agents as the cause of diseases classified elsewhere: Secondary | ICD-10-CM | POA: Diagnosis not present

## 2019-06-02 DIAGNOSIS — Z124 Encounter for screening for malignant neoplasm of cervix: Secondary | ICD-10-CM | POA: Diagnosis not present

## 2019-06-02 DIAGNOSIS — D509 Iron deficiency anemia, unspecified: Secondary | ICD-10-CM | POA: Diagnosis not present

## 2019-06-02 DIAGNOSIS — N760 Acute vaginitis: Secondary | ICD-10-CM | POA: Insufficient documentation

## 2019-06-02 DIAGNOSIS — Z1151 Encounter for screening for human papillomavirus (HPV): Secondary | ICD-10-CM | POA: Diagnosis not present

## 2019-06-02 DIAGNOSIS — Z1231 Encounter for screening mammogram for malignant neoplasm of breast: Secondary | ICD-10-CM | POA: Diagnosis not present

## 2019-06-02 DIAGNOSIS — R87615 Unsatisfactory cytologic smear of cervix: Secondary | ICD-10-CM | POA: Insufficient documentation

## 2019-06-02 DIAGNOSIS — G8929 Other chronic pain: Secondary | ICD-10-CM

## 2019-06-02 DIAGNOSIS — R109 Unspecified abdominal pain: Secondary | ICD-10-CM

## 2019-06-02 NOTE — Progress Notes (Signed)
Subjective:    Brenda Bell - 49 y.o. female MRN [id number]  Date of birth: 20-Sep-1970  HPI  Brenda Bell is here for PAP smear. Prior PAPs have been negative.   Chronic Abdominal Pain: Reports has been occurring since she had appendicitis in July 2018. Pain is located in two different locations: RLQ and in the midline of the abdomen. Pain only occurs when she presses her skin or when there is pressure in the area from clothing, etc. Has regular menstrual cramping but does not worsen with menstrual cycle. No constipation or diarrhea. She said there was an abnormality she was told about in her uterus previously but doesn't remember what it was. She was seen at [facility] and had a pelvic ultrasound done, she believes sometime this fall. She is unsure what the ultrasound showed, ?fibroids.    Health Maintenance Due  Topic Date Due  . PAP SMEAR-Modifier  01/19/2018    -  reports that she has never smoked. She has never used smokeless tobacco. - Review of Systems: Per HPI. - Past Medical History: Patient Active Problem List   Diagnosis Date Noted  . Appendicitis 10/26/2016  . Anemia, iron deficiency 08/20/2013  . FURUNCLE 05/26/2009  . COSTOCHONDRITIS, LEFT 11/19/2007  . FATIGUE 08/12/2007  . ALLERGIC RHINITIS 01/17/2007  . ASTHMA 12/30/2006  . GERD 12/30/2006  . 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 LUMP OR MASS IN BREAST 11/06/2005  . 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  HENT:  Head: Normocephalic and atraumatic.  Mouth/Throat: Oropharynx is clear and moist.  Eyes: Pupils are equal, round, and reactive to light. Conjunctivae and EOM are normal.  Neck: No thyromegaly  present.  Cardiovascular: Normal rate, regular rhythm, normal heart sounds and intact distal pulses.  No murmur heard. Pulmonary/Chest: Effort normal and breath sounds normal. No respiratory distress. She has no wheezes.  Breast exam: Breasts symmetric. No erythema/rashes/skin color changes/skin texture changes/nipple discharge. No discrete palpable masses appreciated in axilla or 4 quadrants of the breast bilaterally.   Abdominal: Soft. Bowel sounds are normal. She exhibits no distension. There is no abdominal tenderness. There is no rebound and no guarding.  Genitourinary:    Genitourinary Comments: Exam performed in the presence of a chaperone. External genitalia within normal limits.  Vaginal mucosa pink, moist, normal rugae.  Nonfriable cervix without lesions, no discharge or bleeding noted on speculum exam.  Bimanual exam revealed normal, nongravid uterus.  No cervical motion tenderness. No adnexal masses bilaterally.     Musculoskeletal:        General: No deformity or edema. Normal range of motion.     Cervical back: Normal range of motion and neck supple.  Lymphadenopathy:    She has no cervical adenopathy.  Neurological: She is alert and oriented to person, place, and time. Gait normal.  Skin: Skin is warm and dry. No rash noted. She is not diaphoretic.  Psychiatric: Mood, affect and judgment normal.           Assessment & Plan:  1. Pap smear for cervical cancer screening - Cytology - PAP([place])  2. Breast cancer screening by mammogram - MM  Digital Screening; Future  3. Chronic abdominal pain Will attempt to obtain records from prior imaging. If not able to obtain, will repeat ultrasound. CT abdomen from 2018 showed 12 mm enhancing nodule in the endometrium. Read stated could represent endometrial polyp or submucosal fibroid. Patient also reports pain started with appendectomy so may be related to scar tissue. No appreciable hernias on exam. Abdominal and pelvic exams  are benign and this has been chronic so does not warrant urgent additional work up.  - US PELVIC COMPLETE WITH TRANSVAGINAL; Future  4. Screening for STDs (sexually transmitted diseases) - Cervicovaginal ancillary only - [facility]  5. Iron deficiency anemia, unspecified iron deficiency anemia type Reports history of anemia. Requests testing today.  - CBC with Differential - Iron, TIBC and Ferritin Panel     Marcy Siren, D.O. 06/02/2019, 3:35 PM Primary Care at [facility]

## 2019-06-03 DIAGNOSIS — R87615 Unsatisfactory cytologic smear of cervix: Secondary | ICD-10-CM | POA: Diagnosis not present

## 2019-06-03 LAB — CBC WITH DIFFERENTIAL/PLATELET
Basophils Absolute: 0 10*3/uL (ref 0.0–0.2)
Basos: 1 %
EOS (ABSOLUTE): 0.1 10*3/uL (ref 0.0–0.4)
Eos: 3 %
Hematocrit: 34 % (ref 34.0–46.6)
Hemoglobin: 10.5 g/dL — ABNORMAL LOW (ref 11.1–15.9)
Immature Grans (Abs): 0 10*3/uL (ref 0.0–0.1)
Immature Granulocytes: 0 %
Lymphocytes Absolute: 1.7 10*3/uL (ref 0.7–3.1)
Lymphs: 44 %
MCH: 24.7 pg — ABNORMAL LOW (ref 26.6–33.0)
MCHC: 30.9 g/dL — ABNORMAL LOW (ref 31.5–35.7)
MCV: 80 fL (ref 79–97)
Monocytes Absolute: 0.6 10*3/uL (ref 0.1–0.9)
Monocytes: 15 %
Neutrophils Absolute: 1.4 10*3/uL (ref 1.4–7.0)
Neutrophils: 37 %
Platelets: 240 10*3/uL (ref 150–450)
RBC: 4.25 x10E6/uL (ref 3.77–5.28)
RDW: 14.5 % (ref 11.7–15.4)
WBC: 3.7 10*3/uL (ref 3.4–10.8)

## 2019-06-03 LAB — IRON,TIBC AND FERRITIN PANEL
Ferritin: 8 ng/mL — ABNORMAL LOW (ref 15–150)
Iron Saturation: 4 % — CL (ref 15–55)
Iron: 20 ug/dL — ABNORMAL LOW (ref 27–159)
Total Iron Binding Capacity: 458 ug/dL — ABNORMAL HIGH (ref 250–450)
UIBC: 438 ug/dL — ABNORMAL HIGH (ref 131–425)

## 2019-06-03 LAB — RPR: RPR Ser Ql: NONREACTIVE

## 2019-06-04 LAB — CERVICOVAGINAL ANCILLARY ONLY
Bacterial Vaginitis (gardnerella): POSITIVE — AB
Candida Glabrata: NEGATIVE
Candida Vaginitis: NEGATIVE
Chlamydia: NEGATIVE
Comment: NEGATIVE
Comment: NEGATIVE
Comment: NEGATIVE
Comment: NEGATIVE
Comment: NEGATIVE
Comment: NORMAL
Neisseria Gonorrhea: NEGATIVE
Trichomonas: NEGATIVE

## 2019-06-09 LAB — CYTOLOGY - PAP
Adequacy: ABNORMAL
Comment: NEGATIVE
High risk HPV: NEGATIVE

## 2019-06-10 ENCOUNTER — Other Ambulatory Visit: Payer: Self-pay | Admitting: Internal Medicine

## 2019-06-10 MED ORDER — METRONIDAZOLE 500 MG PO TABS: 500.0000 mg | ORAL_TABLET | Freq: Two times a day (BID) | ORAL | 0 refills | Status: DC

## 2019-06-10 MED ORDER — FERROUS SULFATE 324 (65 FE) MG PO TBEC: 1.0000 | DELAYED_RELEASE_TABLET | ORAL | 0 refills | Status: DC

## 2019-06-11 ENCOUNTER — Ambulatory Visit (HOSPITAL_BASED_OUTPATIENT_CLINIC_OR_DEPARTMENT_OTHER)
Admission: RE | Admit: 2019-06-11 | Discharge: 2019-06-11 | Disposition: A | Discharge status: Home / Self Care | Payer: BC Managed Care – PPO | Source: Ambulatory Visit | Attending: Internal Medicine | Admitting: Internal Medicine

## 2019-06-11 ENCOUNTER — Other Ambulatory Visit: Payer: Self-pay

## 2019-06-11 DIAGNOSIS — R109 Unspecified abdominal pain: Secondary | ICD-10-CM | POA: Insufficient documentation

## 2019-06-11 DIAGNOSIS — G8929 Other chronic pain: Secondary | ICD-10-CM | POA: Diagnosis not present

## 2019-06-11 DIAGNOSIS — D251 Intramural leiomyoma of uterus: Secondary | ICD-10-CM | POA: Diagnosis not present

## 2019-06-11 DIAGNOSIS — N83291 Other ovarian cyst, right side: Secondary | ICD-10-CM | POA: Diagnosis not present

## 2019-06-29 NOTE — Progress Notes (Signed)
Voicemail full, result letter mailed.

## 2019-06-29 NOTE — Progress Notes (Signed)
Voicemail full, result letter mailed.

## 2019-08-19 ENCOUNTER — Ambulatory Visit: Payer: MEDICAID | Attending: Internal Medicine

## 2019-08-19 DIAGNOSIS — Z20822 Contact with and (suspected) exposure to covid-19: Secondary | ICD-10-CM

## 2019-08-20 LAB — SARS-COV-2, NAA 2 DAY TAT

## 2019-08-20 LAB — NOVEL CORONAVIRUS, NAA: SARS-CoV-2, NAA: NOT DETECTED

## 2019-09-23 ENCOUNTER — Encounter: Payer: Self-pay | Admitting: Internal Medicine

## 2019-09-23 ENCOUNTER — Telehealth (INDEPENDENT_AMBULATORY_CARE_PROVIDER_SITE_OTHER): Payer: BC Managed Care – PPO | Admitting: Internal Medicine

## 2019-09-23 DIAGNOSIS — R05 Cough: Secondary | ICD-10-CM | POA: Diagnosis not present

## 2019-09-23 DIAGNOSIS — Z30011 Encounter for initial prescription of contraceptive pills: Secondary | ICD-10-CM

## 2019-09-23 DIAGNOSIS — D509 Iron deficiency anemia, unspecified: Secondary | ICD-10-CM

## 2019-09-23 DIAGNOSIS — R059 Cough, unspecified: Secondary | ICD-10-CM

## 2019-09-23 MED ORDER — FLUTICASONE PROPIONATE 50 MCG/ACT NA SUSP: 2.0000 | Freq: Every day | NASAL | 2 refills | Status: DC

## 2019-09-23 MED ORDER — CETIRIZINE HCL 10 MG PO TABS: 10.0000 mg | ORAL_TABLET | Freq: Every day | ORAL | 11 refills | Status: DC

## 2019-09-23 MED ORDER — NORETHINDRONE ACET-ETHINYL EST 1-20 MG-MCG PO TABS: 1.0000 | ORAL_TABLET | Freq: Every day | ORAL | 11 refills | Status: DC

## 2019-09-23 NOTE — Progress Notes (Signed)
Virtual Visit via Telephone Note  I connected with Brenda Bell, on 09/23/2019 at 3:34 PM by telephone due to the COVID-19 pandemic and verified that I am speaking with the correct person using two identifiers.   Consent: I discussed the limitations, risks, security and privacy concerns of performing an evaluation and management service by telephone and the availability of in person appointments. I also discussed with the patient that there may be a patient responsible charge related to this service. The patient expressed understanding and agreed to proceed.   Location of Patient: Home  Location of Provider: Clinic    Persons participating in Telemedicine visit: Tirzah Fross [facility] Dr. Dierdre Harness interpreter   History of Present Illness: Patient has a visit for cough. Dry in nature. She was hospitalized in November 2020 for PNA. After discharge, she was taking Tessalon and once she ran out of them her cough came back. Denies productive cough. Sometimes throat hurts, attributes this to cough. Reports that years back she had to use inhaler, it was purple, and it was for allergies. Patient denies history of asthma but this is listed on her problem list. When her cough was really strong, she did have rhinorrhea, sneezing, and nasal congestion. Endorses itchy eyes as well. Has chest tightness and trouble breathing with cough. Denies regurgitation of food, epigastric pain, metallic/bitter taste in mouth.   Also asks for contraception. Says pills she used to take insurance stopped covering them. She is unsure of what they were called--reports it was a regular OCP.    Past Medical History:  Diagnosis Date  . Anemia   . B12 deficiency   . Gastritis   . Vitamin D deficiency    Allergies  Allergen Reactions  . Hydrocodone-Acetaminophen Nausea And Vomiting    Decreased b/p    Current Outpatient Medications on File Prior to Visit  Medication Sig Dispense Refill   . ferrous sulfate 324 (65 Fe) MG TBEC Take 1 tablet (325 mg total) by mouth every other day. 90 tablet 0   No current facility-administered medications on file prior to visit.    Observations/Objective: NAD. Speaking clearly.  Work of breathing normal.  Alert and oriented. Mood appropriate.   Assessment and Plan: 1. Encounter for initial prescription of contraceptive pills Will check Upreg at tomorrow's lab visit. Counseled on how to start Rx and potential need for back up method depending on start date. Not current smoker. No history of migraine with aura, HTN, or VTE.  - norethindrone-ethinyl estradiol (LOESTRIN) 1-20 MG-MCG tablet; Take 1 tablet by mouth daily.  Dispense: 30 tablet; Refill: 11  2. Cough Suspect related to allergic rhinitis given associated symptoms and dry cough. Will treat with intranasal corticosteroid and antihistamine. Asthma may also be contributing as this is listed on her problem list, she reports use of inhaler in the past, and symptoms of chest tightness with cough. I do not see PFTs on record. Will schedule visit with Dr. Delford Field to evaluate further.  - fluticasone (FLONASE) 50 MCG/ACT nasal spray; Place 2 sprays into both nostrils daily.  Dispense: 9.9 mL; Refill: 2 - cetirizine (ZYRTEC) 10 MG tablet; Take 1 tablet (10 mg total) by mouth daily.  Dispense: 30 tablet; Refill: 11   3. Iron deficiency anemia, unspecified iron deficiency anemia type Found to have HgB 10.5 with low iron level of 20 in March 2021. She was started on Fe supplement and reports compliance. Will repeat as has been >3 months.  - CBC;  Future - Iron, TIBC and Ferritin Panel; Future   Follow Up Instructions: Lab visit 6/24; Dr. Joya Gaskins 7/27   I discussed the assessment and treatment plan with the patient. The patient was provided an opportunity to ask questions and all were answered. The patient agreed with the plan and demonstrated an understanding of the instructions.   The patient  was advised to call back or seek an in-person evaluation if the symptoms worsen or if the condition fails to improve as anticipated.     I provided 38 minutes total of non-face-to-face time during this encounter including median intraservice time, reviewing previous notes, investigations, ordering medications, medical decision making, coordinating care and patient verbalized understanding at the end of the visit.    Phill Myron, D.O. Primary Care at [facility]  09/23/2019, 3:34 PM

## 2019-09-23 NOTE — Patient Instructions (Signed)
How to take oral contraceptive pills Options to start taking your first cycle of OCPs: 1. Start the pill on day 1 of your menstrual period. If you start at this time, you will not need any backup form of birth control (contraception), such as condoms. 2. Start the pill on the first Sunday after your menstrual period or on the day you get your prescription. In these cases, you will need to use backup contraception for the first week. 3. Start the pill at any time of your cycle. ? If you take the pill within 5 days of the start of your period, you will not need a backup form of contraception. ? If you start at any other time of your menstrual cycle, you will need to use another form of contraception for 7 days. If your OCP is the type called a minipill, it will protect you from pregnancy after taking it for 2 days (48 hours), and you can stop using backup contraception after that time. After you have started taking OCPs:  If you forget to take 1 pill, take it as soon as you remember. Take the next pill at the regular time.  If you miss 2 or more pills, call your health care provider. Different pills have different instructions for missed doses. Use backup birth control until your next menstrual period starts.  If you use a 28-day pack that contains inactive pills and you miss 1 of the last 7 pills (pills with no hormones), throw away the rest of the non-hormone pills and start a new pill pack. No matter which day you start the OCP, you will always start a new pack on that same day of the week. Have an extra pack of OCPs and a backup contraceptive method available in case you miss some pills or lose your OCP pack. 

## 2019-09-24 ENCOUNTER — Other Ambulatory Visit (INDEPENDENT_AMBULATORY_CARE_PROVIDER_SITE_OTHER): Payer: BC Managed Care – PPO

## 2019-09-24 ENCOUNTER — Other Ambulatory Visit: Payer: Self-pay

## 2019-09-24 DIAGNOSIS — D509 Iron deficiency anemia, unspecified: Secondary | ICD-10-CM | POA: Diagnosis not present

## 2019-09-25 LAB — CBC
Hematocrit: 35.8 % (ref 34.0–46.6)
Hemoglobin: 10.8 g/dL — ABNORMAL LOW (ref 11.1–15.9)
MCH: 24.7 pg — ABNORMAL LOW (ref 26.6–33.0)
MCHC: 30.2 g/dL — ABNORMAL LOW (ref 31.5–35.7)
MCV: 82 fL (ref 79–97)
Platelets: 239 10*3/uL (ref 150–450)
RBC: 4.37 x10E6/uL (ref 3.77–5.28)
RDW: 16.5 % — ABNORMAL HIGH (ref 11.7–15.4)
WBC: 3.6 10*3/uL (ref 3.4–10.8)

## 2019-09-25 LAB — IRON,TIBC AND FERRITIN PANEL
Ferritin: 10 ng/mL — ABNORMAL LOW (ref 15–150)
Iron Saturation: 7 % — CL (ref 15–55)
Iron: 29 ug/dL (ref 27–159)
Total Iron Binding Capacity: 403 ug/dL (ref 250–450)
UIBC: 374 ug/dL (ref 131–425)

## 2019-09-28 NOTE — Progress Notes (Signed)
Patient notified of results & recommendations. Expressed understanding.

## 2019-10-26 DIAGNOSIS — Z1231 Encounter for screening mammogram for malignant neoplasm of breast: Secondary | ICD-10-CM | POA: Diagnosis not present

## 2019-10-27 ENCOUNTER — Encounter: Payer: Self-pay | Admitting: Critical Care Medicine

## 2019-10-27 ENCOUNTER — Other Ambulatory Visit: Payer: Self-pay

## 2019-10-27 ENCOUNTER — Ambulatory Visit: Payer: BC Managed Care – PPO | Attending: Critical Care Medicine | Admitting: Critical Care Medicine

## 2019-10-27 VITALS — BP 111/79 | HR 74 | Temp 98.1°F | Resp 18 | Ht 67.0 in | Wt 191.0 lb

## 2019-10-27 DIAGNOSIS — Z30011 Encounter for initial prescription of contraceptive pills: Secondary | ICD-10-CM | POA: Diagnosis not present

## 2019-10-27 DIAGNOSIS — B9689 Other specified bacterial agents as the cause of diseases classified elsewhere: Secondary | ICD-10-CM | POA: Diagnosis not present

## 2019-10-27 DIAGNOSIS — J4520 Mild intermittent asthma, uncomplicated: Secondary | ICD-10-CM

## 2019-10-27 DIAGNOSIS — N760 Acute vaginitis: Secondary | ICD-10-CM

## 2019-10-27 DIAGNOSIS — Z309 Encounter for contraceptive management, unspecified: Secondary | ICD-10-CM | POA: Insufficient documentation

## 2019-10-27 MED ORDER — METRONIDAZOLE 500 MG PO TABS: 500.0000 mg | ORAL_TABLET | Freq: Three times a day (TID) | ORAL | 0 refills | Status: DC

## 2019-10-27 NOTE — Progress Notes (Signed)
Here referred by PCP for pulmonology consult

## 2019-10-27 NOTE — Assessment & Plan Note (Signed)
During this visit the patient reports she is having more bacterial-like symptoms in her vagina with itching and discharge.  She had a positive cervical vaginal screen previously in May and received a course of Flagyl  We will give the patient 500 mg 3 times daily for 7 days of Flagyl and have her follow back up with her primary care provider

## 2019-10-27 NOTE — Progress Notes (Signed)
Subjective:    Patient ID: Brenda Bell, female    DOB: 1971/01/23, 49 y.o.   MRN: [id number]  This patient is seen as a referral for asthma care by her primary care provider and is a 49 year old female.   Patient says she has had Deppe shortness of breath for the past 3 months which appeared to worsen since a hospitalization in November for pneumonia.  She has had a productive cough.  She did have an inhaler before.  She previously had her tonsils out.  The patient notes a scratchy itchiness in the throat and occasional noise in the throat when she breathes.  She works with Radio broadcast assistant and she states her symptoms did improve when she started wearing a mask during the pandemic around this material.  See asthma assessment below  Asthma She complains of chest tightness, cough, difficulty breathing, frequent throat clearing, shortness of breath, sputum production and wheezing. There is no hemoptysis or hoarse voice. This is a chronic problem. The current episode started more than 1 year ago. The problem occurs intermittently. The problem has been gradually improving. The cough is non-productive, dry, hacking and nocturnal. Pertinent negatives include no appetite change, chest pain, dyspnea on exertion, ear congestion, ear pain, fever, headaches, heartburn, malaise/fatigue, myalgias, nasal congestion, orthopnea, PND, postnasal drip, rhinorrhea, sneezing, sore throat, sweats, trouble swallowing or weight loss. Her symptoms are aggravated by nothing. Her past medical history is significant for asthma.    Past Medical History:  Diagnosis Date  . Anemia   . B12 deficiency   . Gastritis   . Vitamin D deficiency      Family History  Problem Relation Age of Onset  . Hypertension Mother   . Diabetes Father      Social History   Socioeconomic History  . Marital status: Married    Spouse name: Not on file  . Number of children: Not on file  . Years of education: Not on file  . Highest  education level: Not on file  Occupational History  . Not on file  Tobacco Use  . Smoking status: Never Smoker  . Smokeless tobacco: Never Used  Substance and Sexual Activity  . Alcohol use: No    Alcohol/week: 0.0 standard drinks  . Drug use: No  . Sexual activity: Yes    Birth control/protection: I.U.D.  Other Topics Concern  . Not on file  Social History Narrative  . Not on file   Social Determinants of Health   Financial Resource Strain:   . Difficulty of Paying Living Expenses:   Food Insecurity:   . Worried About Programme researcher, broadcasting/film/video in the Last Year:   . Barista in the Last Year:   Transportation Needs:   . Freight forwarder (Medical):   Marland Kitchen Lack of Transportation (Non-Medical):   Physical Activity:   . Days of Exercise per Week:   . Minutes of Exercise per Session:   Stress:   . Feeling of Stress :   Social Connections:   . Frequency of Communication with Friends and Family:   . Frequency of Social Gatherings with Friends and Family:   . Attends Religious Services:   . Active Member of Clubs or Organizations:   . Attends Banker Meetings:   Marland Kitchen Marital Status:   Intimate Partner Violence:   . Fear of Current or Ex-Partner:   . Emotionally Abused:   Marland Kitchen Physically Abused:   . Sexually Abused:  Allergies  Allergen Reactions  . Hydrocodone-Acetaminophen Nausea And Vomiting    Decreased b/p     Outpatient Medications Prior to Visit  Medication Sig Dispense Refill  . cetirizine (ZYRTEC) 10 MG tablet Take 1 tablet (10 mg total) by mouth daily. 30 tablet 11  . ferrous sulfate 324 (65 Fe) MG TBEC Take 1 tablet (325 mg total) by mouth every other day. 90 tablet 0  . fluticasone (FLONASE) 50 MCG/ACT nasal spray Place 2 sprays into both nostrils daily. 9.9 mL 2  . norethindrone-ethinyl estradiol (LOESTRIN) 1-20 MG-MCG tablet Take 1 tablet by mouth daily. 30 tablet 11   No facility-administered medications prior to visit.     Review of  Systems  Constitutional: Negative for appetite change, fever, malaise/fatigue and weight loss.  HENT: Negative for ear pain, hoarse voice, postnasal drip, rhinorrhea, sneezing, sore throat and trouble swallowing.   Respiratory: Positive for cough, sputum production, shortness of breath and wheezing. Negative for hemoptysis.   Cardiovascular: Negative for chest pain, dyspnea on exertion and PND.  Gastrointestinal: Negative for heartburn.  Musculoskeletal: Negative for myalgias.  Neurological: Negative for headaches.       Objective:   Physical Exam Vitals:   10/27/19 0925  BP: 111/79  Pulse: 74  Resp: 18  Temp: 98.1 F (36.7 C)  SpO2: 100%  Weight: 191 lb (86.6 kg)  Height: 5\' 7"  (1.702 m)    Gen: Pleasant, well-nourished, in no distress,  normal affect  ENT: No lesions,  mouth clear,  oropharynx clear, no postnasal drip, mild turbinate edema clear mucus  Neck: No JVD, no TMG, no carotid bruits  Lungs: No use of accessory muscles, no dullness to percussion, clear without rales or rhonchi  Cardiovascular: RRR, heart sounds normal, no murmur or gallops, no peripheral edema  Abdomen: soft and NT, no HSM,  BS normal  Musculoskeletal: No deformities, no cyanosis or clubbing  Neuro: alert, non focal  Skin: Warm, no lesions or rashes  Patient was given a peak flow meter and instructed as to its proper use     Assessment & Plan:  I personally reviewed all images and lab data in the [facility] system as well as any outside material available during this office visit and agree with the  radiology impressions.   Asthma, mild intermittent Mild intermittent asthma at this time not active  Plan is to give the patient a peak flow meter and she will monitor her flow rates if they decline below her current best level of 400 we will see her back in follow-up for now no medications are indicated no further work-up indicated  Contraceptive management The patient wishes contraceptive  management the current birth control  Loestrin has been too expensive for this patient to obtain  I message the patient's primary care provider who is also her gynecologist to see if we can get her a less expensive formulation  Bacterial vaginosis During this visit the patient reports she is having more bacterial-like symptoms in her vagina with itching and discharge.  She had a positive cervical vaginal screen previously in May and received a course of Flagyl  We will give the patient 500 mg 3 times daily for 7 days of Flagyl and have her follow back up with her primary care provider   Diagnoses and all orders for this visit:  Encounter for initial prescription of contraceptive pills  Mild intermittent asthma without complication  Bacterial vaginosis  Other orders -     metroNIDAZOLE (FLAGYL) 500 MG  tablet; Take 1 tablet (500 mg total) by mouth 3 (three) times daily.

## 2019-10-27 NOTE — Assessment & Plan Note (Signed)
The patient wishes contraceptive management the current birth control  Loestrin has been too expensive for this patient to obtain  I message the patient's primary care provider who is also her gynecologist to see if we can get her a less expensive formulation

## 2019-10-27 NOTE — Assessment & Plan Note (Signed)
Mild intermittent asthma at this time not active  Plan is to give the patient a peak flow meter and she will monitor her flow rates if they decline below her current best level of 400 we will see her back in follow-up for now no medications are indicated no further work-up indicated

## 2019-10-27 NOTE — Patient Instructions (Signed)
Take flagyl three times a day for 7 days for vagina Use peak flow meter to check you breathing for any changes No other medications changes  Return to see Dr Delford Field as needed    Asma en los adultos Asthma, Adult  El asma es una afeccin a largo plazo (crnica) en la que las vas respiratorias se Engineer, technical sales y se obstruyen. Las vas respiratorias son los conductos que van desde la Clinical cytogeneticist y la boca hasta los pulmones. Una persona con asma pasar por momentos en que los sntomas empeoran. Estos se conocen como ataques de asma. Pueden causar tos, sonidos agudos al respirar (sibilancias), falta de aire y dolor en el pecho. Esto puede dificultar la respiracin. No hay una cura para el asma, pero los medicamentos y los cambios en el estilo de vida pueden ayudar a Theatre manager. Hay muchas cosas que pueden provocar un ataque de asma o intensificar los sntomas de la enfermedad (factores desencadenantes). Los factores desencadenantes comunes incluyen los siguientes:  Moho.  Polvo.  Humo del cigarrillo.  Cucarachas.  Cosas que causan sntomas de alergia (alrgenos). Estas incluyen escamas de la piel de los animales (caspa) y el polen de los pastos o los rboles.  Cosas que contaminan el aire. Entre ellas, productos de limpieza del hogar, humo de lea, niebla contaminada u olores qumicos.  Aire fro, cambios climticos y el viento.  Llanto o risa intensos.  Estrs.  Ciertos medicamentos o frmacos.  Ciertos alimentos, como frutas secas, papas fritas y [place].  Infecciones tales como los resfros o la gripe.  Ciertas enfermedades o afecciones.  Ejercicio o actividades extenuantes. El asma se puede tratar con medicamentos y mantenindose alejado de los factores que desencadenan los ataques de asma. Los medicamentos pueden incluir los siguientes:  Medicamentos de control. Estos ayudan a evitar los sntomas de asma. Generalmente, se toman [employer].  Medicamentos de [place] o de  rescate de accin rpida. Estos alivian los sntomas de asma rpidamente. Se utilizan cuando es necesario y proporcionan alivio a Product manager.  Medicamentos para alergias si los ataques son ocasionados por alrgenos.  Medicamentos para ayudar a Animator de defensa (inmunitario) del organismo. Siga estas instrucciones en su casa: Cmo evitar desencadenantes en su hogar  Cambie el filtro de la calefaccin y del aire acondicionado con frecuencia.  Limite el uso de hogares o estufas a lea.  Elimine las plagas (como cucarachas, ratones) y sus excrementos.  Deseche las plantas si observa moho en ellas.  Limpie los pisos. Elimine el polvo regularmente. Use productos de limpieza que sean inodoros.  Pdale a alguien que pase la aspiradora cuando usted no se encuentre en casa. Utilice una aspiradora con un filtro de aire de alta eficiencia (HEPA), siempre que sea posible.  Reemplace las alfombras por pisos de [place], baldosas o vinilo. Las alfombras pueden retener las escamas de la piel de los animales y [place].  Use almohadas, mantas y Psychologist, counselling.  Lave las sbanas y las mantas todas las semanas con agua caliente. Squelas en [place].  Mantenga su habitacin libre de desencadenantes.  Evite las [employer] y [employer] ventanas cerradas cuando en el aire haya cosas que causen sntomas de [place].  Use mantas de polister o algodn.  Limpie baos y cocinas con lavandina. Si fuera posible, pdale a alguien que vuelva a pintar las paredes de estas habitaciones con [place]omia Dear pintura resistente a los hongos. Aljese de las habitaciones que se estn limpiando y pintando.  Lvese las  manos frecuentemente con agua y [place]. Use desinfectante para manos si no dispone de [place] y [place].  No permita que nadie fume en su casa. Instrucciones generales  Use los medicamentos de venta libre y los recetados solamente como se lo haya indicado el mdico. ? Hable con el mdico  si tiene preguntas sobre cmo y cundo tomar sus medicamentos. ? Tome nota si necesita tomar sus medicamentos con ms frecuencia que lo usual.  No consuma ningn producto que contenga nicotina o tabaco, como cigarrillos y cigarrillos electrnicos. Si necesita ayuda para dejar de consumir, consulte al mdico.  Aljese del humo de otros fumadores.  Evite realizar actividades al aire libre cuando la cantidad de alrgenos sea alta y la calidad del aire sea baja.  Use un pasamontaas al hacer actividades al [place] en invierno. Este debe cubrir la nariz y la boca. [address] fros, ejerctese en interiores, de ser posible.  Entre en calor antes de hacer ejercicio. Dedique tiempo a enfriarse luego de Arts development officer fsicas.  Use un espirmetro como se lo haya indicado el mdico. El espirmetro es una herramienta que mide el funcionamiento de los pulmones.  Lleve un registro de los valores del espirmetro. Antelos.  Siga el plan de accin para el asma. Este es una planificacin por escrito para el control del asma y el tratamiento de los ataques.  Asegrese de recibir todas las vacunas que el mdico le recomiende. Consulte al [employer] vacunas para la gripe y la neumona.  Concurra a todas las visitas de [address] se lo haya indicado el mdico. Esto es importante. Comunquese con un mdico si:  Tiene sibilancias, respira con dificultad o tose aun cuando toma medicamentos para prevenir ataques.  La mucosidad que expectora cuando tose (esputo) es ms espesa que lo habitual.  La mucosidad que expectora cambia de trasparente o blanco a amarillo, verde, gris o sanguinolenta.  Tiene trastornos a causa de los [employer] toma, por ejemplo: ? Erupcin cutnea. ? Picazn. ? Hinchazn. ? Dificultad para respirar.  Necesita un medicamento de alivio ms de 2 a 3veces por semana.  El valor de flujo mximo an est entre el 50 y el 79% del Arts administrator personal  despus de seguir el plan de accin durante 1hora.  Tiene fiebre. Solicite ayuda inmediatamente si:  Advertising account executive y no responde al [employer] durante un ataque de asma.  Le falta el aire, incluso en reposo.  Le falta el aire incluso cuando hace muy poca actividad fsica.  Tiene dificultad para comer, beber o hablar.  Siente dolor u opresin en el pecho.  Tiene latidos cardacos acelerados.  Los labios o las uas se le tornan [place].  Siente que est por desvanecerse, est mareado o se desmaya.  Su flujo mximo es menor que el 50% del Arts administrator personal.  Se siente demasiado cansado para respirar con normalidad. Resumen  El asma es una afeccin a largo plazo (crnica) en la que las vas respiratorias se Engineer, technical sales y se obstruyen. El ataque de asma puede causar dificultad para respirar.  El asma no puede curarse, pero los [employer] y los cambios en el estilo de vida lo ayudarn a Scientist, physiological enfermedad.  Asegrese de comprender cmo evitar los desencadenantes y cmo y [employer]. Esta informacin no tiene Theme park manager el consejo del mdico. Asegrese de hacerle al mdico cualquier pregunta que tenga. Document Revised: 06/30/2018 Document Reviewed: 10/24/2016 Elsevier Patient Education  2020 [employer].

## 2019-10-30 ENCOUNTER — Other Ambulatory Visit: Payer: Self-pay

## 2019-10-30 DIAGNOSIS — Z30011 Encounter for initial prescription of contraceptive pills: Secondary | ICD-10-CM

## 2019-10-30 MED ORDER — NORETHINDRONE ACET-ETHINYL EST 1-20 MG-MCG PO TABS: 1.0000 | ORAL_TABLET | Freq: Every day | ORAL | 11 refills | Status: DC

## 2019-10-30 MED FILL — NORETHIND-ETH ESTRAD 1-0.02: 1-20 | 21 days supply | Qty: 21 | Fill #0

## 2019-11-03 MED FILL — LARIN 21 1-20 TABLET: 1-20 | 21 days supply | Qty: 21 | Fill #0

## 2019-11-09 ENCOUNTER — Ambulatory Visit (HOSPITAL_COMMUNITY)
Admission: EM | Admit: 2019-11-09 | Discharge: 2019-11-09 | Disposition: A | Discharge status: Home / Self Care | Payer: BC Managed Care – PPO | Attending: Family Medicine | Admitting: Family Medicine

## 2019-11-09 ENCOUNTER — Encounter (HOSPITAL_COMMUNITY): Payer: Self-pay | Admitting: Emergency Medicine

## 2019-11-09 ENCOUNTER — Other Ambulatory Visit: Payer: Self-pay

## 2019-11-09 DIAGNOSIS — Z20822 Contact with and (suspected) exposure to covid-19: Secondary | ICD-10-CM | POA: Insufficient documentation

## 2019-11-09 DIAGNOSIS — B9689 Other specified bacterial agents as the cause of diseases classified elsewhere: Secondary | ICD-10-CM | POA: Diagnosis not present

## 2019-11-09 DIAGNOSIS — J208 Acute bronchitis due to other specified organisms: Secondary | ICD-10-CM | POA: Diagnosis not present

## 2019-11-09 LAB — SARS CORONAVIRUS 2 (TAT 6-24 HRS): SARS Coronavirus 2: POSITIVE — AB

## 2019-11-09 MED ORDER — ALBUTEROL SULFATE HFA 108 (90 BASE) MCG/ACT IN AERS
2.0000 | INHALATION_SPRAY | Freq: Once | RESPIRATORY_TRACT | Status: AC
  Administered 2019-11-09: 2 via RESPIRATORY_TRACT

## 2019-11-09 MED ORDER — PROMETHAZINE-CODEINE 6.25-10 MG/5ML PO SYRP: 5.0000 mL | ORAL_SOLUTION | Freq: Three times a day (TID) | ORAL | 0 refills | Status: DC | PRN

## 2019-11-09 MED ORDER — ALBUTEROL SULFATE HFA 108 (90 BASE) MCG/ACT IN AERS
INHALATION_SPRAY | RESPIRATORY_TRACT | Status: AC
  Filled 2019-11-09: qty 6.7

## 2019-11-09 MED ORDER — METHYLPREDNISOLONE SODIUM SUCC 125 MG IJ SOLR
INTRAMUSCULAR | Status: AC
  Filled 2019-11-09: qty 2

## 2019-11-09 MED ORDER — METHYLPREDNISOLONE SODIUM SUCC 125 MG IJ SOLR
125.0000 mg | Freq: Once | INTRAMUSCULAR | Status: AC
  Administered 2019-11-09: 125 mg via INTRAMUSCULAR

## 2019-11-09 MED ORDER — CEFDINIR 300 MG PO CAPS
300.0000 mg | ORAL_CAPSULE | Freq: Two times a day (BID) | ORAL | 0 refills | Status: DC
Start: 2019-11-09 — End: 2020-02-08

## 2019-11-09 MED ORDER — PREDNISONE 20 MG PO TABS
40.0000 mg | ORAL_TABLET | Freq: Every day | ORAL | 0 refills | Status: AC
Start: 2019-11-09 — End: 2019-11-14

## 2019-11-09 NOTE — ED Provider Notes (Signed)
[facility]    CSN: [id number] Arrival date & time: 11/09/19  0825      History   Chief Complaint Chief Complaint  Patient presents with  . Cough    HPI Brenda Bell is a 49 y.o. female.   HPI Patient with a history of asthma and pneumonia presents for evaluation coughing, wheezing, shortness of breath.  Symptoms started x 3 days ago and have gradually worsened. She is unvaccinated against COVID-19. She has not measured temperature although endorses feeling feverish. Denies chest pain, dizziness, HA, or new weakness. She has taken OTC medications without relief of symptoms.   Past Medical History:  Diagnosis Date  . Anemia   . B12 deficiency   . Gastritis   . Vitamin D deficiency     Patient Active Problem List   Diagnosis Date Noted  . Contraceptive management 10/27/2019  . Bacterial vaginosis 10/27/2019  . Anemia, iron deficiency 08/20/2013  . ALLERGIC RHINITIS 01/17/2007  . Asthma, mild intermittent 12/30/2006  . GERD 12/30/2006  . 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 BREAST SURGERY Left    cyst removed  . CESAREAN SECTION    . CHOLECYSTECTOMY    . KNEE SURGERY Right    tendon repair  . LAPAROSCOPIC APPENDECTOMY N/A 10/26/2016   Procedure: APPENDECTOMY LAPAROSCOPIC;  Surgeon: Berna Bue, MD;  Location: [facility] OR;  Service: General;  Laterality: N/A;  . 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 TONSILLECTOMY Bilateral     OB History    Gravida  3   Para  3   Term  3   Preterm      AB      Living  3     SAB      TAB      Ectopic      Multiple      Live Births               Home Medications    Prior to Admission medications   Medication Sig Start Date End Date Taking? Authorizing Provider  acetaminophen (TYLENOL) 325 MG tablet Take 650 mg by mouth every 6 (six) hours as needed.   Yes [provider]  cetirizine  (ZYRTEC) 10 MG tablet Take 1 tablet (10 mg total) by mouth daily. 09/23/19   Arvilla Market, DO  ferrous sulfate 324 (65 Fe) MG TBEC Take 1 tablet (325 mg total) by mouth every other day. 06/10/19   Arvilla Market, DO  fluticasone (FLONASE) 50 MCG/ACT nasal spray Place 2 sprays into both nostrils daily. 09/23/19   Arvilla Market, DO  metroNIDAZOLE (FLAGYL) 500 MG tablet Take 1 tablet (500 mg total) by mouth 3 (three) times daily. 10/27/19   Storm Frisk, MD  norethindrone-ethinyl estradiol (LOESTRIN) 1-20 MG-MCG tablet Take 1 tablet by mouth daily. 10/30/19   Arvilla Market, DO    Family History Family History  Problem Relation Age of Onset  . Hypertension Mother   . Diabetes Father     Social History Social History   Tobacco Use  . Smoking status: Never Smoker  . Smokeless tobacco: Never Used  Substance Use Topics  . Alcohol use: No    Alcohol/week: 0.0 standard drinks  . Drug use: No     Allergies   Hydrocodone-acetaminophen   Review of Systems Review of Systems Pertinent  negatives listed in HPI  Physical Exam Triage Vital Signs ED Triage Vitals  Enc Vitals Group     BP 11/09/19 0928 107/75     Pulse Rate 11/09/19 0928 84     Resp 11/09/19 0928 18     Temp 11/09/19 0928 98.6 F (37 C)     Temp Source 11/09/19 0928 Oral     SpO2 11/09/19 0928 100 %     Weight --      Height --      Head Circumference --      Peak Flow --      Pain Score 11/09/19 0921 7     Pain Loc --      Pain Edu? --      Excl. in GC? --    No data found.  Updated Vital Signs BP 107/75 (BP Location: Left Arm)   Pulse 84   Temp 98.6 F (37 C) (Oral) Comment: took tylenol at 6 am  Resp 18   LMP 10/19/2019   SpO2 100%   Visual Acuity Right Eye Distance:   Left Eye Distance:   Bilateral Distance:    Right Eye Near:   Left Eye Near:    Bilateral Near:     Physical Exam Constitutional:      Appearance: She is ill-appearing.  HENT:       Right Ear: External ear normal.     Left Ear: External ear normal.     Nose: Congestion present.  Cardiovascular:     Rate and Rhythm: Normal rate and regular rhythm.  Pulmonary:     Breath sounds: Wheezing and rhonchi present.  Abdominal:     General: There is no distension.     Palpations: Abdomen is soft.  Musculoskeletal:     Cervical back: Normal range of motion.  Lymphadenopathy:     Cervical: No cervical adenopathy.  Skin:    General: Skin is warm.  Neurological:     General: No focal deficit present.  Psychiatric:        Mood and Affect: Mood normal.    UC Treatments / Results  Labs (all labs ordered are listed, but only abnormal results are displayed) Labs Reviewed  SARS CORONAVIRUS 2 (TAT 6-24 HRS)    EKG   Radiology Recent Results (from the past 2160 hour(s))  Novel Coronavirus, NAA ([facility])     Status: None   Collection Time: 08/19/19 10:57 AM   Specimen: Nasopharyngeal(NP) swabs in vial transport medium   NASOPHARYNGE  TESTING  Result Value Ref Range   SARS-CoV-2, NAA Not Detected Not Detected    Comment: This nucleic acid amplification test was developed and its performance characteristics determined by [employer]. Nucleic acid amplification tests include RT-PCR and TMA. This test has not been FDA cleared or approved. This test has been authorized by FDA under an Emergency Use Authorization (EUA). This test is only authorized for the duration of time the declaration that circumstances exist justifying the authorization of the emergency use of in vitro diagnostic tests for detection of SARS-CoV-2 virus and/or diagnosis of COVID-19 infection under section 564(b)(1) of the Act, 21 U.S.C. 660YTK-1(S) (1), unless the authorization is terminated or revoked sooner. When diagnostic testing is negative, the possibility of a false negative result should be considered in the context of a patient's recent exposures and the presence of clinical  signs and symptoms consistent with COVID-19. An individual without symptoms of COVID-19 and who is not shedding SARS-CoV-2 virus wo uld expect  to have a negative (not detected) result in this assay.   SARS-COV-2, NAA 2 DAY TAT     Status: None   Collection Time: 08/19/19 10:57 AM   NASOPHARYNGE  TESTING  Result Value Ref Range   SARS-CoV-2, NAA 2 DAY TAT Performed   Iron, TIBC and Ferritin Panel     Status: Abnormal   Collection Time: 09/24/19  4:20 PM  Result Value Ref Range   Total Iron Binding Capacity 403 250 - 450 ug/dL   UIBC 409374 811131 - 914425 ug/dL   Iron 29 27 - 782159 ug/dL   Iron Saturation 7 (LL) 15 - 55 %   Ferritin 10 (L) 15.0 - 150.0 ng/mL  CBC     Status: Abnormal   Collection Time: 09/24/19  4:20 PM  Result Value Ref Range   WBC 3.6 3.4 - 10.8 x10E3/uL   RBC 4.37 3.77 - 5.28 x10E6/uL   Hemoglobin 10.8 (L) 11.1 - 15.9 g/dL   Hematocrit 95.635.8 21.334.0 - 46.6 %   MCV 82 79 - 97 fL   MCH 24.7 (L) 26.6 - 33.0 pg   MCHC 30.2 (L) 31 - 35 g/dL   RDW 08.616.5 (H) 57.811.7 - 46.915.4 %   Platelets 239 150 - 450 x10E3/uL  SARS CORONAVIRUS 2 (TAT 6-24 HRS) Nasopharyngeal Nasopharyngeal Swab     Status: Abnormal   Collection Time: 11/09/19  9:36 AM   Specimen: Nasopharyngeal Swab  Result Value Ref Range   SARS Coronavirus 2 POSITIVE (A) NEGATIVE    Comment: EMAILED TO MELISSA BROWNING 1821 11/09/19 M[place]ORMICK K (NOTE) SARS-CoV-2 target nucleic acids are DETECTED.  The SARS-CoV-2 RNA is generally detectable in upper and lower respiratory specimens during the acute phase of infection. Positive results are indicative of the presence of SARS-CoV-2 RNA. Clinical correlation with patient history and other diagnostic information is  necessary to determine patient infection status. Positive results do not rule out bacterial infection or co-infection with other viruses.  The expected result is Negative.  Fact Sheet for Patients: HairSlick.nohttps://www.fda.gov/media/138098/download  Fact Sheet for Healthcare  Providers: quierodirigir.comhttps://www.fda.gov/media/138095/download  This test is not yet approved or cleared by the [place] FDA and  has been authorized for detection and/or diagnosis of SARS-CoV-2 by FDA under an Emergency Use Authorization (EUA). This EUA will remain  in effect (meaning this test can be used) for the duration of the COVID-1 9 declaration under Section 564(b)(1) of the Act, 21 U.S.C. section 360bbb-3(b)(1), unless the authorization is terminated or revoked sooner.   Performed at [facility], [address] [place], [place] [address]     Procedures Procedures (including critical care time)  Medications Ordered in UC Medications - No data to display  Initial Impression / Assessment and Plan / UC Course  I have reviewed the triage vital signs and the nursing notes.  Pertinent labs & imaging results that were available during my care of the patient were reviewed by me and considered in my medical decision making (see chart for details).      COVID-19 test pending. Work note provided for 72 hours to allow time for test to result. Patient encouraged to self isolate while test is pending. Treating for a secondary infection, acute bacterial pneumonia. See medications orders. Red Flags discussed warranting immediate ER follow-up. Patient verbalized understanding and agreement of plan.  Final Clinical Impressions(s) / UC Diagnoses   Final diagnoses:  Suspected COVID-19 virus infection  Acute bacterial bronchitis   Discharge Instructions   None  ED Prescriptions    Medication Sig Dispense Auth. Provider   promethazine-codeine (PHENERGAN WITH CODEINE) 6.25-10 MG/5ML syrup  (Status: Discontinued) Take 5 mLs by mouth every 8 (eight) hours as needed for cough. 120 mL Bing Neighbors, FNP   cefdinir (OMNICEF) 300 MG capsule Take 1 capsule (300 mg total) by mouth 2 (two) times daily. 20 capsule Bing Neighbors, FNP   predniSONE (DELTASONE) 20 MG tablet Take 2  tablets (40 mg total) by mouth daily with breakfast for 5 days. 10 tablet Bing Neighbors, FNP   promethazine-codeine (PHENERGAN WITH CODEINE) 6.25-10 MG/5ML syrup Take 5 mLs by mouth every 8 (eight) hours as needed for cough. 120 mL Bing Neighbors, FNP     PDMP not reviewed this encounter.   Bing Neighbors, FNP 11/12/19 2217

## 2019-11-09 NOTE — ED Triage Notes (Addendum)
Symptoms started 3 days ago.  Patient has a cough, chest congestion, wheezing, fever.  Denies chills, no change in sense of smell or taste.  Patient has a headache  Took tylenol at 6 am this morning

## 2019-11-09 NOTE — Discharge Instructions (Signed)
Your COVID 19 results will be available in 24 hours. Negative results are immediately resulted to Mychart. Positive results will receive a follow-up call from our clinic.  If positive, you will need to quarantine 10 days If symptoms are present, I recommend home quarantine until results are known.

## 2019-11-14 ENCOUNTER — Other Ambulatory Visit: Payer: Self-pay | Admitting: Family Medicine

## 2019-11-14 NOTE — Telephone Encounter (Signed)
Re routing to PCP office

## 2019-11-23 DIAGNOSIS — Z20822 Contact with and (suspected) exposure to covid-19: Secondary | ICD-10-CM | POA: Diagnosis not present

## 2019-11-23 DIAGNOSIS — Z6830 Body mass index (BMI) 30.0-30.9, adult: Secondary | ICD-10-CM | POA: Diagnosis not present

## 2020-01-05 ENCOUNTER — Other Ambulatory Visit: Payer: Self-pay

## 2020-01-05 ENCOUNTER — Ambulatory Visit (HOSPITAL_COMMUNITY)
Admission: EM | Admit: 2020-01-05 | Discharge: 2020-01-05 | Disposition: A | Discharge status: Home / Self Care | Payer: BC Managed Care – PPO | Attending: Family Medicine | Admitting: Family Medicine

## 2020-01-05 ENCOUNTER — Encounter (HOSPITAL_COMMUNITY): Payer: Self-pay | Admitting: *Deleted

## 2020-01-05 DIAGNOSIS — J069 Acute upper respiratory infection, unspecified: Secondary | ICD-10-CM | POA: Diagnosis not present

## 2020-01-05 DIAGNOSIS — Z20822 Contact with and (suspected) exposure to covid-19: Secondary | ICD-10-CM | POA: Insufficient documentation

## 2020-01-05 LAB — RESP PANEL BY RT PCR (RSV, FLU A&B, COVID)
Influenza A by PCR: NEGATIVE
Influenza B by PCR: NEGATIVE
Respiratory Syncytial Virus by PCR: NEGATIVE
SARS Coronavirus 2 by RT PCR: NEGATIVE

## 2020-01-05 MED ORDER — ALBUTEROL SULFATE HFA 108 (90 BASE) MCG/ACT IN AERS: 1.0000 | INHALATION_SPRAY | Freq: Four times a day (QID) | RESPIRATORY_TRACT | 0 refills | Status: DC | PRN

## 2020-01-05 MED ORDER — PROMETHAZINE-DM 6.25-15 MG/5ML PO SYRP
5.0000 mL | ORAL_SOLUTION | Freq: Four times a day (QID) | ORAL | 0 refills | Status: DC | PRN
Start: 2020-01-05 — End: 2020-02-08

## 2020-01-05 MED ORDER — PREDNISONE 20 MG PO TABS
40.0000 mg | ORAL_TABLET | Freq: Every day | ORAL | 0 refills | Status: DC
Start: 2020-01-05 — End: 2020-02-08

## 2020-01-05 NOTE — ED Provider Notes (Signed)
[facility]    CSN: [id number] Arrival date & time: 01/05/20  4098      History   Chief Complaint Chief Complaint  Patient presents with  . Fever  . Sore Throat  . Headache  . Muscle Pain    HPI Brenda Bell is a 49 y.o. female.   Here today with 1 day of generalized body aches, headache, sore throat, congestion of thick sputum, productive cough, fever, fatigue, chest tightness, morning wheezes. Denies CP, SOB, N/V/D, intolerance to PO, known new sick contacts. States she had COVID last month and feels overall that resolved. Hx of asthma, had a little bit of albuterol inhaler leftover which she states does help. Also using vapor rubs, OTC antihistamines with mild relief.      Past Medical History:  Diagnosis Date  . Anemia   . B12 deficiency   . Gastritis   . Vitamin D deficiency     Patient Active Problem List   Diagnosis Date Noted  . Contraceptive management 10/27/2019  . Bacterial vaginosis 10/27/2019  . Anemia, iron deficiency 08/20/2013  . ALLERGIC RHINITIS 01/17/2007  . Asthma, mild intermittent 12/30/2006  . GERD 12/30/2006  . 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 BREAST SURGERY Left    cyst removed  . CESAREAN SECTION    . CHOLECYSTECTOMY    . KNEE SURGERY Right    tendon repair  . LAPAROSCOPIC APPENDECTOMY N/A 10/26/2016   Procedure: APPENDECTOMY LAPAROSCOPIC;  Surgeon: Berna Bue, MD;  Location: [facility];  Service: General;  Laterality: N/A;  . 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 TONSILLECTOMY Bilateral     OB History    Gravida  3   Para  3   Term  3   Preterm      AB      Living  3     SAB      TAB      Ectopic      Multiple      Live Births               Home Medications    Prior to Admission medications   Medication Sig Start Date End Date Taking? Authorizing Provider  acetaminophen (TYLENOL) 325  MG tablet Take 650 mg by mouth every 6 (six) hours as needed.   Yes [provider]  cetirizine (ZYRTEC) 10 MG tablet Take 1 tablet (10 mg total) by mouth daily. 09/23/19  Yes Arvilla Market, DO  fluticasone (FLONASE) 50 MCG/ACT nasal spray Place 2 sprays into both nostrils daily. 09/23/19  Yes Arvilla Market, DO  norethindrone-ethinyl estradiol (LOESTRIN) 1-20 MG-MCG tablet Take 1 tablet by mouth daily. 10/30/19  Yes Arvilla Market, DO  albuterol (VENTOLIN HFA) 108 (90 Base) MCG/ACT inhaler Inhale 1-2 puffs into the lungs every 6 (six) hours as needed for wheezing or shortness of breath. 01/05/20   Particia Nearing, PA-C  cefdinir (OMNICEF) 300 MG capsule Take 1 capsule (300 mg total) by mouth 2 (two) times daily. 11/09/19   Bing Neighbors, FNP  ferrous sulfate 324 (65 Fe) MG TBEC Take 1 tablet (325 mg total) by mouth every other day. 06/10/19   Arvilla Market, DO  predniSONE (DELTASONE) 20 MG tablet Take 2 tablets (40 mg total) by mouth daily with breakfast. 01/05/20   Particia Nearing, PA-C  promethazine-codeine (PHENERGAN WITH CODEINE) 6.25-10 MG/5ML syrup Take 5 mLs by mouth every 8 (eight) hours as needed for cough. 11/09/19   Bing Neighbors, FNP  promethazine-dextromethorphan (PROMETHAZINE-DM) 6.25-15 MG/5ML syrup Take 5 mLs by mouth 4 (four) times daily as needed for cough. 01/05/20   Particia Nearing, PA-C    Family History Family History  Problem Relation Age of Onset  . Hypertension Mother   . Diabetes Father     Social History Social History   Tobacco Use  . Smoking status: Never Smoker  . Smokeless tobacco: Never Used  Substance Use Topics  . Alcohol use: No    Alcohol/week: 0.0 standard drinks  . Drug use: No     Allergies   Hydrocodone-acetaminophen   Review of Systems Review of Systems PER HPI    Physical Exam Triage Vital Signs ED Triage Vitals  Enc Vitals Group     BP 01/05/20 1127  128/78     Pulse Rate 01/05/20 1127 85     Resp 01/05/20 1127 17     Temp 01/05/20 1127 99 F (37.2 C)     Temp Source 01/05/20 1127 Oral     SpO2 01/05/20 1127 100 %     Weight --      Height --      Head Circumference --      Peak Flow --      Pain Score 01/05/20 1044 8     Pain Loc --      Pain Edu? --      Excl. in GC? --    No data found.  Updated Vital Signs BP 128/78 (BP Location: Right Arm)   Pulse 85   Temp 99 F (37.2 C) (Oral)   Resp 17   LMP 11/13/2019   SpO2 100%   Visual Acuity Right Eye Distance:   Left Eye Distance:   Bilateral Distance:    Right Eye Near:   Left Eye Near:    Bilateral Near:     Physical Exam Vitals and nursing note reviewed.  Constitutional:      Appearance: Normal appearance. She is not ill-appearing.  HENT:     Head: Atraumatic.     Right Ear: Tympanic membrane normal.     Left Ear: Tympanic membrane normal.     Nose: Rhinorrhea present.     Comments: Significant nasal mucosal edema and erythema b/l    Mouth/Throat:     Mouth: Mucous membranes are moist.     Pharynx: Oropharynx is clear. Posterior oropharyngeal erythema present. No oropharyngeal exudate.  Eyes:     Extraocular Movements: Extraocular movements intact.     Conjunctiva/sclera: Conjunctivae normal.  Cardiovascular:     Rate and Rhythm: Normal rate and regular rhythm.     Heart sounds: Normal heart sounds.  Pulmonary:     Effort: Pulmonary effort is normal.     Breath sounds: Normal breath sounds. No wheezing or rales.  Abdominal:     General: Bowel sounds are normal. There is no distension.     Palpations: Abdomen is soft.     Tenderness: There is no abdominal tenderness. There is no guarding.  Musculoskeletal:        General: Normal range of motion.     Cervical back: Normal range of motion and neck supple.  Skin:    General: Skin is warm and dry.  Neurological:     Mental Status: She is alert and oriented to person, place, and time.  Psychiatric:         Mood and Affect: Mood normal.        Thought Content: Thought content normal.        Judgment: Judgment normal.     UC Treatments / Results  Labs (all labs ordered are listed, but only abnormal results are displayed) Labs Reviewed  RESP PANEL BY RT PCR (RSV, FLU A&B, COVID)    EKG   Radiology No results found.  Procedures Procedures (including critical care time)  Medications Ordered in UC Medications - No data to display  Initial Impression / Assessment and Plan / UC Course  I have reviewed the triage vital signs and the nursing notes.  Pertinent labs & imaging results that were available during my care of the patient were reviewed by me and considered in my medical decision making (see chart for details).     Suspect Viral URI, respiratory panel PCR pending, isolate until results return and sxs resolve. O2 sat 100%, pulmonary exam CTAB. Will tx with prednisone burst, refill albuterol inhaler and provide phenergan DM for cough suppression. Recommended mucinex, continued antihistamine regimen. Return precautions reviewed with patient.   Final Clinical Impressions(s) / UC Diagnoses   Final diagnoses:  Viral URI with cough   Discharge Instructions   None    ED Prescriptions    Medication Sig Dispense Auth. Provider   predniSONE (DELTASONE) 20 MG tablet Take 2 tablets (40 mg total) by mouth daily with breakfast. 10 tablet Particia Nearing, PA-C   albuterol (VENTOLIN HFA) 108 (90 Base) MCG/ACT inhaler Inhale 1-2 puffs into the lungs every 6 (six) hours as needed for wheezing or shortness of breath. 18 g Roosvelt Maser [place], [place]   promethazine-dextromethorphan (PROMETHAZINE-DM) 6.25-15 MG/5ML syrup Take 5 mLs by mouth 4 (four) times daily as needed for cough. 100 mL Particia Nearing, [place]     PDMP not reviewed this encounter.   Particia Nearing, [place] 01/05/20 1302

## 2020-01-05 NOTE — ED Triage Notes (Signed)
Patient in with muscle pain, chills, headache,fever, sore throat and green/brown phlegm. Patient states this morning sputum was green/brown and mixed with streaks of blood. Patient states she does not have a cough.Patient was COVID positive on 11/09/19. Patient has taken Tylenol at home.

## 2020-02-08 ENCOUNTER — Ambulatory Visit
Admission: RE | Admit: 2020-02-08 | Discharge: 2020-02-08 | Disposition: A | Discharge status: Home / Self Care | Payer: BC Managed Care – PPO | Source: Ambulatory Visit | Attending: Emergency Medicine | Admitting: Emergency Medicine

## 2020-02-08 ENCOUNTER — Other Ambulatory Visit: Payer: Self-pay

## 2020-02-08 VITALS — BP 125/85 | HR 80 | Temp 98.1°F | Resp 18

## 2020-02-08 DIAGNOSIS — N760 Acute vaginitis: Secondary | ICD-10-CM | POA: Diagnosis not present

## 2020-02-08 DIAGNOSIS — Z885 Allergy status to narcotic agent status: Secondary | ICD-10-CM | POA: Diagnosis not present

## 2020-02-08 DIAGNOSIS — Z113 Encounter for screening for infections with a predominantly sexual mode of transmission: Secondary | ICD-10-CM | POA: Diagnosis not present

## 2020-02-08 DIAGNOSIS — Z79899 Other long term (current) drug therapy: Secondary | ICD-10-CM | POA: Insufficient documentation

## 2020-02-08 DIAGNOSIS — N898 Other specified noninflammatory disorders of vagina: Secondary | ICD-10-CM | POA: Diagnosis not present

## 2020-02-08 LAB — POCT URINALYSIS DIP (MANUAL ENTRY)
Bilirubin, UA: NEGATIVE
Blood, UA: NEGATIVE
Glucose, UA: NEGATIVE mg/dL
Ketones, POC UA: NEGATIVE mg/dL
Leukocytes, UA: NEGATIVE
Nitrite, UA: NEGATIVE
Protein Ur, POC: NEGATIVE mg/dL
Spec Grav, UA: 1.025 (ref 1.010–1.025)
Urobilinogen, UA: 0.2 E.U./dL
pH, UA: 7 (ref 5.0–8.0)

## 2020-02-08 MED ORDER — FLUCONAZOLE 150 MG PO TABS
150.0000 mg | ORAL_TABLET | Freq: Every day | ORAL | 0 refills | Status: DC
Start: 2020-02-08 — End: 2020-09-27

## 2020-02-08 MED ORDER — METRONIDAZOLE 500 MG PO TABS
500.0000 mg | ORAL_TABLET | Freq: Two times a day (BID) | ORAL | 0 refills | Status: DC
Start: 2020-02-08 — End: 2020-09-27

## 2020-02-08 NOTE — ED Provider Notes (Signed)
[facility]    CSN: [id number] Arrival date & time: 02/08/20  1622      History   Chief Complaint Chief Complaint  Patient presents with  . appt 5-vaginal itching    HPI Brenda Bell is a 49 y.o. female  Presenting for weeklong course of vaginal irritation and discharge.  Also endorsing urgency without frequency, dysuria.  Denies known STI exposures.  Endorsing history of BV: States this feels similar.  Past Medical History:  Diagnosis Date  . Anemia   . B12 deficiency   . Gastritis   . Vitamin D deficiency     Patient Active Problem List   Diagnosis Date Noted  . Contraceptive management 10/27/2019  . Bacterial vaginosis 10/27/2019  . Anemia, iron deficiency 08/20/2013  . ALLERGIC RHINITIS 01/17/2007  . Asthma, mild intermittent 12/30/2006  . GERD 12/30/2006  . 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 BREAST SURGERY Left    cyst removed  . CESAREAN SECTION    . CHOLECYSTECTOMY    . KNEE SURGERY Right    tendon repair  . LAPAROSCOPIC APPENDECTOMY N/A 10/26/2016   Procedure: APPENDECTOMY LAPAROSCOPIC;  Surgeon: Berna Bue, MD;  Location: [facility] OR;  Service: General;  Laterality: N/A;  . 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 TONSILLECTOMY Bilateral     OB History    Gravida  3   Para  3   Term  3   Preterm      AB      Living  3     SAB      TAB      Ectopic      Multiple      Live Births               Home Medications    Prior to Admission medications   Medication Sig Start Date End Date Taking? Authorizing Provider  acetaminophen (TYLENOL) 325 MG tablet Take 650 mg by mouth every 6 (six) hours as needed.    [provider]  albuterol (VENTOLIN HFA) 108 (90 Base) MCG/ACT inhaler Inhale 1-2 puffs into the lungs every 6 (six) hours as needed for wheezing or shortness of breath. 01/05/20   Particia Nearing,  PA-C  cetirizine (ZYRTEC) 10 MG tablet Take 1 tablet (10 mg total) by mouth daily. 09/23/19   Arvilla Market, DO  fluconazole (DIFLUCAN) 150 MG tablet Take 1 tablet (150 mg total) by mouth daily. May repeat in 72 hours if needed 02/08/20   Hall-Potvin, [place], PA-C  fluticasone (FLONASE) 50 MCG/ACT nasal spray Place 2 sprays into both nostrils daily. 09/23/19   Arvilla Market, DO  metroNIDAZOLE (FLAGYL) 500 MG tablet Take 1 tablet (500 mg total) by mouth 2 (two) times daily. 02/08/20   Hall-Potvin, [place], PA-C    Family History Family History  Problem Relation Age of Onset  . Hypertension Mother   . Diabetes Father     Social History Social History   Tobacco Use  . Smoking status: Never Smoker  . Smokeless tobacco: Never Used  Substance Use Topics  . Alcohol use: No    Alcohol/week: 0.0 standard drinks  . Drug use: No     Allergies   Hydrocodone-acetaminophen   Review of Systems Review of Systems  Constitutional: Negative for fatigue and fever.  Respiratory: Negative for cough and shortness of  breath.   Cardiovascular: Negative for chest pain and palpitations.  Gastrointestinal: Negative for constipation and diarrhea.  Genitourinary: Positive for urgency and vaginal discharge. Negative for dysuria, flank pain, frequency, hematuria, pelvic pain, vaginal bleeding and vaginal pain.     Physical Exam Triage Vital Signs ED Triage Vitals  Enc Vitals Group     BP 02/08/20 1705 125/85     Pulse Rate 02/08/20 1705 80     Resp 02/08/20 1705 18     Temp 02/08/20 1705 98.1 F (36.7 C)     Temp Source 02/08/20 1705 Oral     SpO2 02/08/20 1705 99 %     Weight --      Height --      Head Circumference --      Peak Flow --      Pain Score 02/08/20 1719 0     Pain Loc --      Pain Edu? --      Excl. in GC? --    No data found.  Updated Vital Signs BP 125/85 (BP Location: Left Arm)   Pulse 80   Temp 98.1 F (36.7 C) (Oral)   Resp 18   LMP  11/13/2019   SpO2 99%   Visual Acuity Right Eye Distance:   Left Eye Distance:   Bilateral Distance:    Right Eye Near:   Left Eye Near:    Bilateral Near:     Physical Exam Constitutional:      General: She is not in acute distress. HENT:     Head: Normocephalic and atraumatic.  Eyes:     General: No scleral icterus.    Pupils: Pupils are equal, round, and reactive to light.  Cardiovascular:     Rate and Rhythm: Normal rate.  Pulmonary:     Effort: Pulmonary effort is normal.  Abdominal:     General: Bowel sounds are normal.     Palpations: Abdomen is soft.     Tenderness: There is no abdominal tenderness. There is no right CVA tenderness, left CVA tenderness or guarding.  Genitourinary:    Comments: Patient declined, self-swab performed Skin:    Coloration: Skin is not jaundiced or pale.  Neurological:     Mental Status: She is alert and oriented to person, place, and time.      UC Treatments / Results  Labs (all labs ordered are listed, but only abnormal results are displayed) Labs Reviewed  POCT URINALYSIS DIP (MANUAL ENTRY)  CERVICOVAGINAL ANCILLARY ONLY    EKG   Radiology No results found.  Procedures Procedures (including critical care time)  Medications Ordered in UC Medications - No data to display  Initial Impression / Assessment and Plan / UC Course  I have reviewed the triage vital signs and the nursing notes.  Pertinent labs & imaging results that were available during my care of the patient were reviewed by me and considered in my medical decision making (see chart for details).     Urine dipstick unremarkable, cytology pending.  Will treat empirically for BV given history.  Return precautions discussed, pt verbalized understanding and is agreeable to plan. Final Clinical Impressions(s) / UC Diagnoses   Final diagnoses:  Acute vaginitis     Discharge Instructions     Today you received treatment for BV, yeast. Testing for  chlamydia, gonorrhea, trichomonas is pending: please look for these results on the MyChart app/website.  We will notify you if you are positive and outline treatment at that  time.  Important to avoid all forms of sexual intercourse (oral, vaginal, anal) with any/all partners for the next 7 days to avoid spreading/reinfecting. Any/all sexual partners should be notified of testing/treatment today.  Return for persistent/worsening symptoms or if you develop fever, abdominal or pelvic pain, discharge, genital pain, blood in your urine, or are re-exposed to an STI.    ED Prescriptions    Medication Sig Dispense Auth. Provider   metroNIDAZOLE (FLAGYL) 500 MG tablet Take 1 tablet (500 mg total) by mouth 2 (two) times daily. 14 tablet Hall-Potvin, [place], PA-C   fluconazole (DIFLUCAN) 150 MG tablet Take 1 tablet (150 mg total) by mouth daily. May repeat in 72 hours if needed 2 tablet Hall-Potvin, [place], PA-C     PDMP not reviewed this encounter.   Hall-Potvin, [place], [place] 02/08/20 1827

## 2020-02-08 NOTE — ED Triage Notes (Signed)
Pt c/o vaginal itching, white discharge, and urinary frequency since last week.

## 2020-02-08 NOTE — Discharge Instructions (Addendum)
Today you received treatment for BV, yeast. °Testing for chlamydia, gonorrhea, trichomonas is pending: please look for these results on the MyChart app/website.  We will notify you if you are positive and outline treatment at that time. ° °Important to avoid all forms of sexual intercourse (oral, vaginal, anal) with any/all partners for the next 7 days to avoid spreading/reinfecting. °Any/all sexual partners should be notified of testing/treatment today. ° °Return for persistent/worsening symptoms or if you develop fever, abdominal or pelvic pain, discharge, genital pain, blood in your urine, or are re-exposed to an STI. °

## 2020-02-10 LAB — CERVICOVAGINAL ANCILLARY ONLY
Bacterial Vaginitis (gardnerella): POSITIVE — AB
Candida Glabrata: NEGATIVE
Candida Vaginitis: NEGATIVE
Chlamydia: NEGATIVE
Comment: NEGATIVE
Comment: NEGATIVE
Comment: NEGATIVE
Comment: NEGATIVE
Comment: NEGATIVE
Comment: NORMAL
Neisseria Gonorrhea: NEGATIVE
Trichomonas: NEGATIVE

## 2020-03-31 IMAGING — US US PELVIS COMPLETE WITH TRANSVAGINAL
1 series · 13 of 25 positions shown · non-contrast
Comparison: Prior CT from 10/26/2016.

CLINICAL DATA: Follow-up examination for enhancing mass seen at the
central uterus on prior CT.



[Series 1: us pelvis complete with transvaginal · 54 acquisitions, 13 frames shown]
[im 1/54]
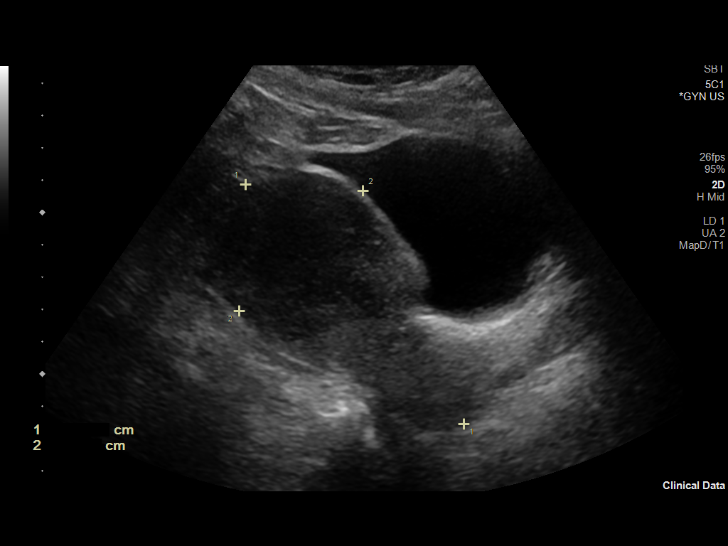
[im 5/54]
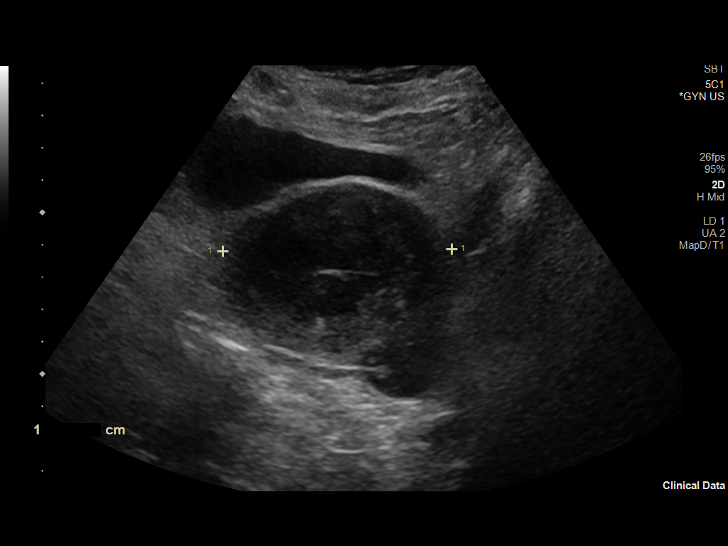
[im 9/54]
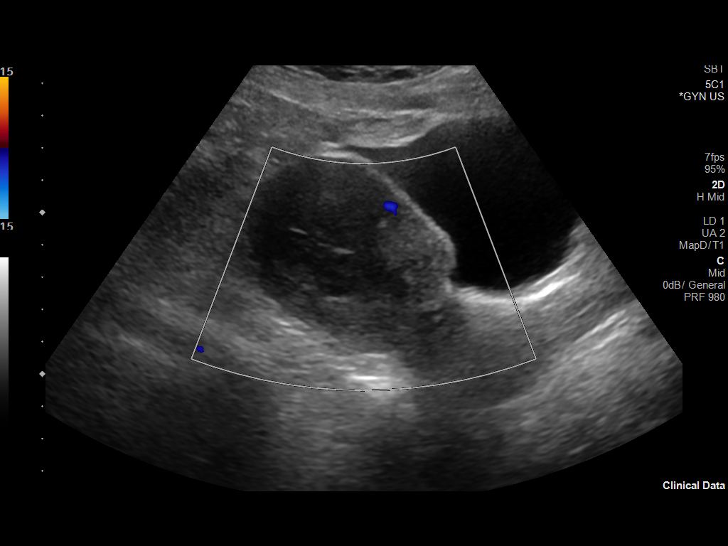
[im 14/54]
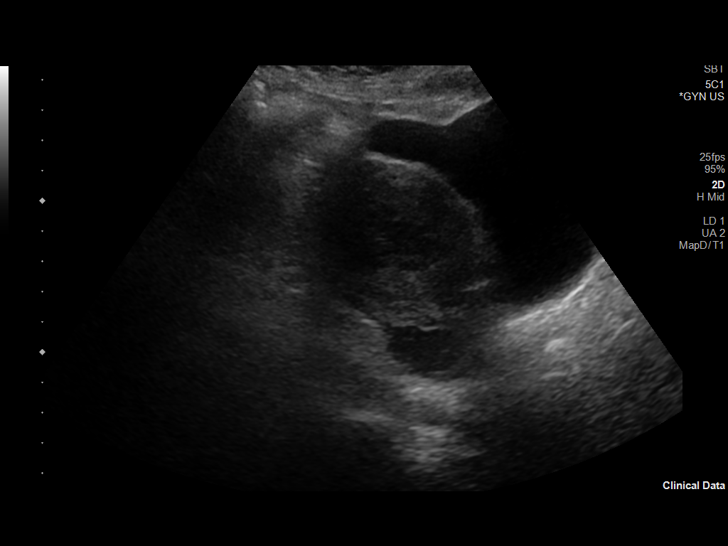
[im 18/54]
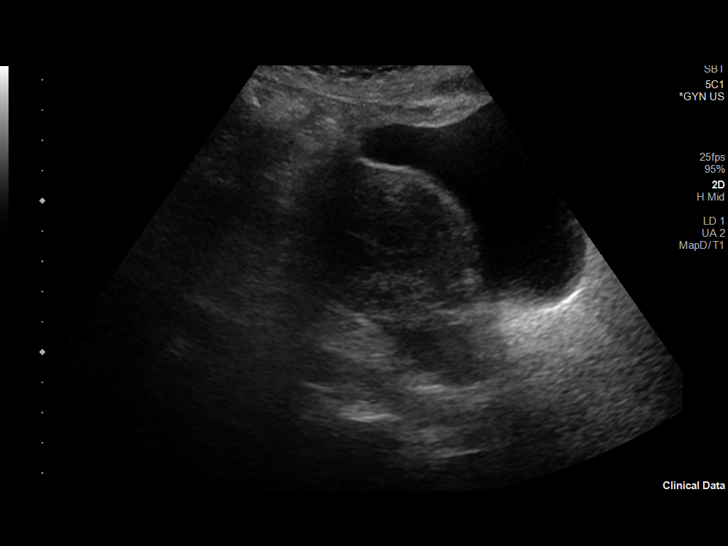
[im 23/54]
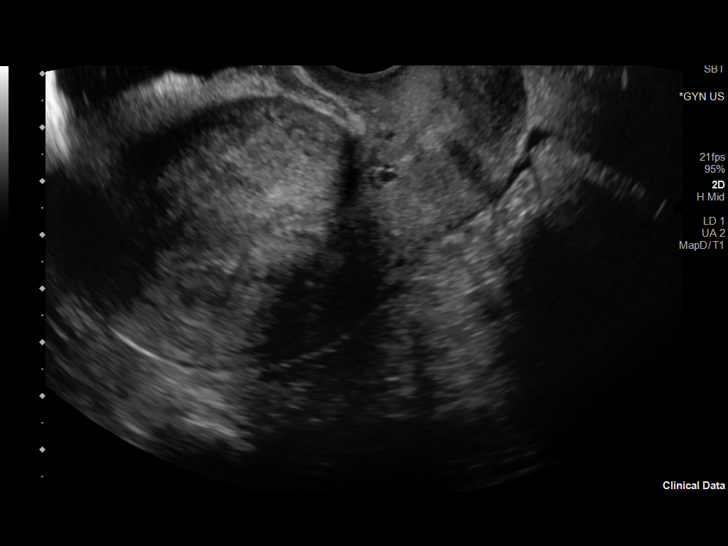
[im 27/54]
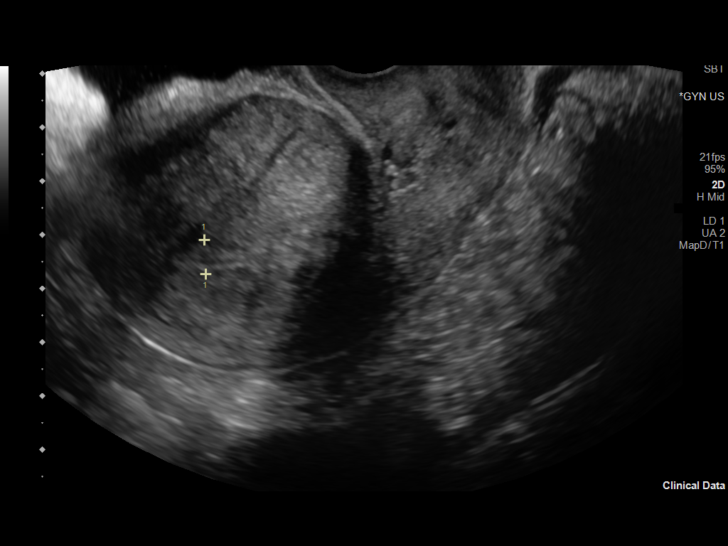
[im 31/54]
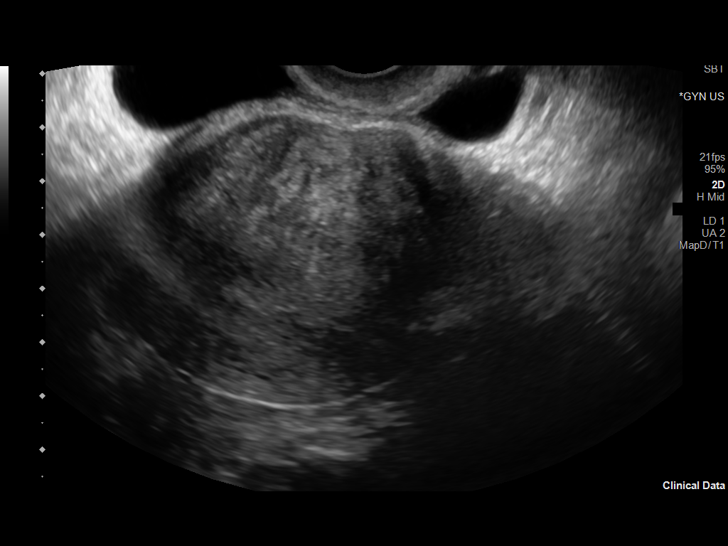
[im 36/54]
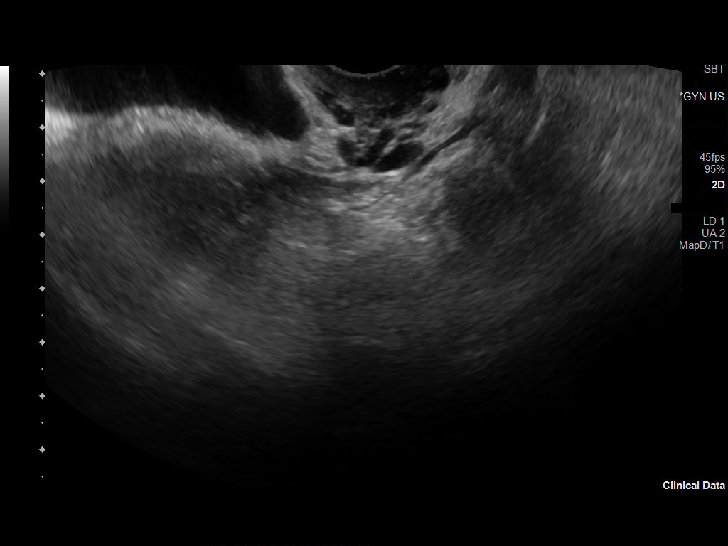
[im 40/54]
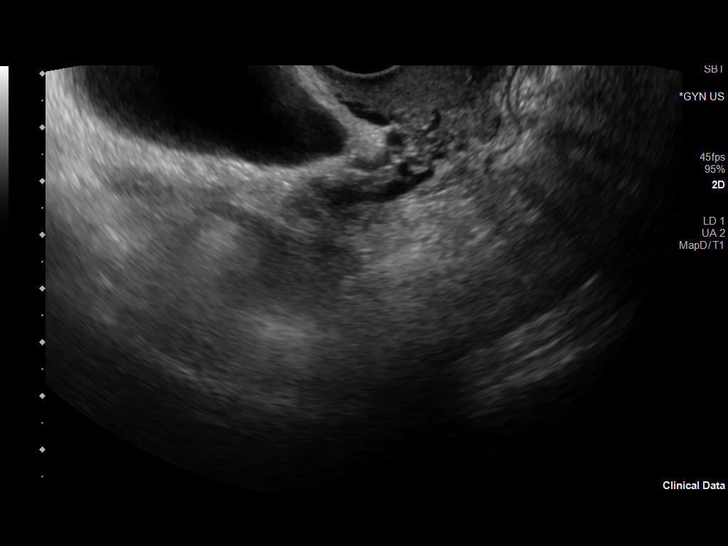
[im 45/54]
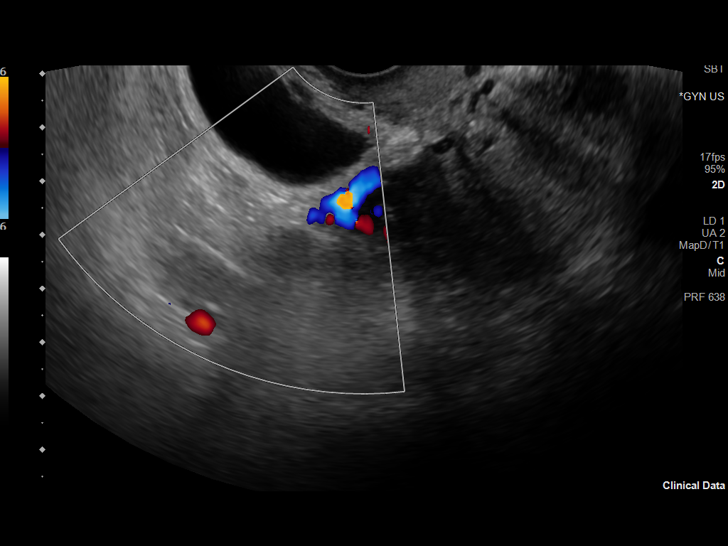
[im 49/54]
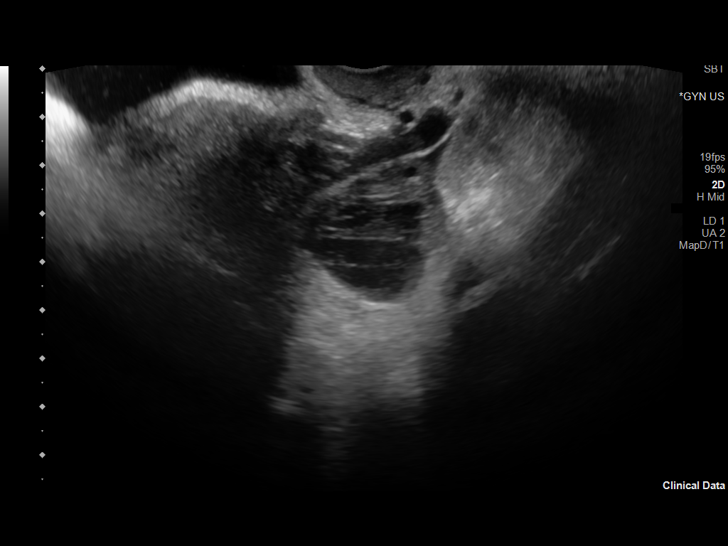
[im 54/54]
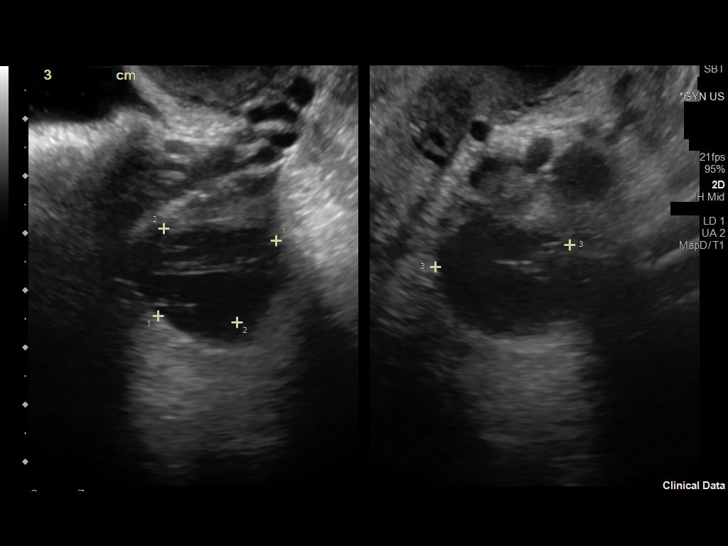

[13 of 25 positions shown; findings below may reference images not displayed]

FINDINGS: Uterus

Measurements: 9.0 x 5.0 x 6.6 cm = volume: 155 mL. Uterus is
anteverted. 1.5 x 1.6 x 1.8 cm heterogeneous well-circumscribed
lesion seen at the posterior/central aspect of the uterus, most
characteristic of a intramural fibroid (image 29). No other discrete
mass

Endometrium

Thickness: 6 mm.  No focal abnormality visualized.

Right ovary

Measurements: 1.9 x 1.4 x 1.8 cm = volume: 2.4 mL. Right ovary
difficult to visualize. Grossly normal appearance. No adnexal mass.

Left ovary

Measurements: 3.6 x 2.7 x 3.8 cm = volume: 19.0 mL. 2.5 x 2.1 x
cm complex cystic lesion with internal lace-like architecture, most
characteristic of a hemorrhagic cyst. No internal vascularity or
solid component.

Other findings

No abnormal free fluid.
IMPRESSION: 1. 1.8 cm intramural fibroid, presumably corresponding with
abnormality seen on prior CT from 4139. No other discrete myometrial
or endometrial mass identified.
2. 2.5 cm complex right ovarian cyst, most characteristic of a
hemorrhagic cyst. While this is almost certainly benign, a short
interval follow-up ultrasound in 6-12 weeks to ensure resolution
could be performed as clinically warranted.

## 2020-09-27 ENCOUNTER — Ambulatory Visit (HOSPITAL_COMMUNITY)
Admission: EM | Admit: 2020-09-27 | Discharge: 2020-09-27 | Disposition: A | Discharge status: Home / Self Care | Payer: BLUE CROSS/BLUE SHIELD | Attending: Physician Assistant | Admitting: Physician Assistant

## 2020-09-27 ENCOUNTER — Encounter (HOSPITAL_COMMUNITY): Payer: Self-pay | Admitting: Emergency Medicine

## 2020-09-27 ENCOUNTER — Other Ambulatory Visit: Payer: Self-pay

## 2020-09-27 DIAGNOSIS — R5383 Other fatigue: Secondary | ICD-10-CM | POA: Insufficient documentation

## 2020-09-27 DIAGNOSIS — R0602 Shortness of breath: Secondary | ICD-10-CM | POA: Diagnosis not present

## 2020-09-27 DIAGNOSIS — R519 Headache, unspecified: Secondary | ICD-10-CM | POA: Diagnosis not present

## 2020-09-27 DIAGNOSIS — R509 Fever, unspecified: Secondary | ICD-10-CM | POA: Diagnosis not present

## 2020-09-27 DIAGNOSIS — R059 Cough, unspecified: Secondary | ICD-10-CM

## 2020-09-27 DIAGNOSIS — Z28310 Unvaccinated for covid-19: Secondary | ICD-10-CM | POA: Diagnosis not present

## 2020-09-27 DIAGNOSIS — J4521 Mild intermittent asthma with (acute) exacerbation: Secondary | ICD-10-CM | POA: Diagnosis not present

## 2020-09-27 DIAGNOSIS — J069 Acute upper respiratory infection, unspecified: Secondary | ICD-10-CM | POA: Insufficient documentation

## 2020-09-27 DIAGNOSIS — U071 COVID-19: Secondary | ICD-10-CM | POA: Diagnosis not present

## 2020-09-27 LAB — SARS CORONAVIRUS 2 (TAT 6-24 HRS): SARS Coronavirus 2: POSITIVE — AB

## 2020-09-27 LAB — POC INFLUENZA A AND B ANTIGEN (URGENT CARE ONLY)
INFLUENZA A ANTIGEN, POC: NEGATIVE
INFLUENZA B ANTIGEN, POC: NEGATIVE

## 2020-09-27 MED ORDER — PROMETHAZINE-DM 6.25-15 MG/5ML PO SYRP: 5.0000 mL | ORAL_SOLUTION | Freq: Two times a day (BID) | ORAL | 0 refills | Status: DC | PRN

## 2020-09-27 MED ORDER — PREDNISONE 10 MG (21) PO TBPK: ORAL_TABLET | ORAL | 0 refills | Status: DC

## 2020-09-27 MED ORDER — ALBUTEROL SULFATE HFA 108 (90 BASE) MCG/ACT IN AERS
2.0000 | INHALATION_SPRAY | Freq: Once | RESPIRATORY_TRACT | Status: AC
  Administered 2020-09-27: 2 via RESPIRATORY_TRACT

## 2020-09-27 MED ORDER — ALBUTEROL SULFATE HFA 108 (90 BASE) MCG/ACT IN AERS
INHALATION_SPRAY | RESPIRATORY_TRACT | Status: AC
  Filled 2020-09-27: qty 6.7

## 2020-09-27 NOTE — Discharge Instructions (Addendum)
Your flu test was negative.  Your COVID test is pending.  We will start a prednisone taper.  Do not take NSAIDs including aspirin, ibuprofen/Advil, naproxen/Aleve with prednisone as it can cause stomach bleeding.  Use Promethazine DM up to twice a day as needed for cough.  Do not drive or drink alcohol with this as drowsiness is a common side effect.  You can use Tylenol for fever and pain.  Use your inhaler every 4-6 hours as needed for cough and shortness of breath.  If anything is worsening please return for reevaluation.  Follow-up with PCP within 1 week to ensure symptom improvement.

## 2020-09-27 NOTE — ED Provider Notes (Signed)
[facility]    CSN: [id number] Arrival date & time: 09/27/20  0973      History   Chief Complaint Chief Complaint  Patient presents with   Cough    Chest tightness   Fatigue   Shortness of Breath    HPI Brenda Bell is a 50 y.o. female.   Patient presents today with a 2-day history of URI symptoms.  She is Spanish-speaking and video interpreter was utilized during visit.  Reports severe cough, chest tightness, subjective fever, body aches, headache.  Denies any chest pain, shortness of breath, nausea, vomiting.  She has tried over-the-counter medications without improvement of symptoms.  She does have a history of seasonal allergies but is not taking any medication to manage this condition as previously antihistamines have been ineffective.  She does have a history of asthma and has been using albuterol inhaler intermittently as needed with temporary improvement of symptoms.  Reports last use of albuterol was last night.  She has not had influenza or COVID-19 vaccination.  Reports several sick contacts at work with similar symptoms; is unsure what they were ultimately diagnosed with.  She denies history of smoking.  She reports symptoms are interfering with her ability perform daily activities.   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    Past Surgical History:  Procedure Laterality Date   BREAST SURGERY Left    cyst removed   CESAREAN SECTION     CHOLECYSTECTOMY     KNEE SURGERY Right    tendon repair   LAPAROSCOPIC APPENDECTOMY N/A 10/26/2016   Procedure: APPENDECTOMY LAPAROSCOPIC;  Surgeon: Berna Bue, MD;   Location: [facility] OR;  Service: General;  Laterality: N/A;   OVARY SURGERY Right    benign tumor of right ovary removed, 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 Authorizing Provider  predniSONE (STERAPRED UNI-PAK 21 TAB) 10 MG (21) TBPK tablet As directed 09/27/20  Yes Vint Pola K, PA-C  promethazine-dextromethorphan (PROMETHAZINE-DM) 6.25-15 MG/5ML syrup Take 5 mLs by mouth 2 (two) times daily as needed for cough. 09/27/20  Yes Bera Pinela, Noberto Retort, PA-C  acetaminophen (TYLENOL) 325 MG tablet Take 650 mg by mouth every 6 (six) hours as needed.    [provider]  albuterol (VENTOLIN HFA) 108 (90 Base) MCG/ACT inhaler Inhale 1-2 puffs into the lungs every 6 (six) hours as needed for wheezing or shortness of breath. 01/05/20   Particia Nearing, PA-C  cetirizine (ZYRTEC) 10 MG tablet Take 1 tablet (  10 mg total) by mouth daily. 09/23/19   Arvilla Market, MD  fluticasone (FLONASE) 50 MCG/ACT nasal spray Place 2 sprays into both nostrils daily. 09/23/19   Arvilla Market, MD    Family History Family History  Problem Relation Age of Onset   Hypertension Mother    Diabetes Father     Social History Social History   Tobacco Use   Smoking status: Never   Smokeless tobacco: Never  Substance Use Topics   Alcohol use: No    Alcohol/week: 0.0 standard drinks   Drug use: No     Allergies   Hydrocodone-acetaminophen   Review of Systems Review of Systems  Constitutional:  Positive for activity change, fatigue and fever (subjective). Negative for appetite change.  HENT:  Positive for congestion. Negative for sinus pressure, sneezing and sore throat.   Respiratory:   Positive for cough and chest tightness. Negative for shortness of breath.   Cardiovascular:  Negative for chest pain.  Gastrointestinal:  Negative for abdominal pain, diarrhea, nausea and vomiting.  Musculoskeletal:  Positive for arthralgias and myalgias.  Neurological:  Positive for headaches. Negative for dizziness and light-headedness.    Physical Exam Triage Vital Signs ED Triage Vitals  Enc Vitals Group     BP 09/27/20 1014 123/75     Pulse Rate 09/27/20 1014 83     Resp 09/27/20 1014 18     Temp 09/27/20 1014 99.2 F (37.3 C)     Temp Source 09/27/20 1014 Oral     SpO2 09/27/20 1014 96 %     Weight --      Height --      Head Circumference --      Peak Flow --      Pain Score 09/27/20 1016 0     Pain Loc --      Pain Edu? --      Excl. in GC? --    No data found.  Updated Vital Signs BP 123/75   Pulse 83   Temp 99.2 F (37.3 C) (Oral)   Resp 18   LMP 11/13/2019   SpO2 96%   Visual Acuity Right Eye Distance:   Left Eye Distance:   Bilateral Distance:    Right Eye Near:   Left Eye Near:    Bilateral Near:     Physical Exam Vitals reviewed.  Constitutional:      General: She is awake. She is not in acute distress.    Appearance: Normal appearance. She is normal weight. She is not ill-appearing.     Comments: Very pleasant female appears stated age in no acute distress  HENT:     Head: Normocephalic and atraumatic.     Right Ear: Tympanic membrane, ear canal and external ear normal. Tympanic membrane is not erythematous or bulging.     Left Ear: Tympanic membrane, ear canal and external ear normal. Tympanic membrane is not erythematous or bulging.     Nose:     Right Sinus: No maxillary sinus tenderness or frontal sinus tenderness.     Left Sinus: No maxillary sinus tenderness or frontal sinus tenderness.     Mouth/Throat:     Pharynx: Uvula midline. No oropharyngeal exudate or posterior oropharyngeal erythema.  Cardiovascular:     Rate and Rhythm:  Normal rate and regular rhythm.     Heart sounds: Normal heart sounds, S1 normal and S2 normal. No murmur heard. Pulmonary:     Effort: Pulmonary effort is normal.  Breath sounds: Examination of the right-lower field reveals wheezing. Examination of the left-lower field reveals wheezing. Wheezing present. No rhonchi or rales.     Comments: Wheezing and decreased aeration bilateral bases Lymphadenopathy:     Head:     Right side of head: No submental, submandibular or tonsillar adenopathy.     Left side of head: No submental, submandibular or tonsillar adenopathy.     Cervical: No cervical adenopathy.  Psychiatric:        Behavior: Behavior is cooperative.     UC Treatments / Results  Labs (all labs ordered are listed, but only abnormal results are displayed) Labs Reviewed  SARS CORONAVIRUS 2 (TAT 6-24 HRS)  POC INFLUENZA A AND B ANTIGEN (URGENT CARE ONLY)    EKG   Radiology No results found.  Procedures Procedures (including critical care time)  Medications Ordered in UC Medications  albuterol (VENTOLIN HFA) 108 (90 Base) MCG/ACT inhaler 2 puff (2 puffs Inhalation Given 09/27/20 1045)    Initial Impression / Assessment and Plan / UC Course  I have reviewed the triage vital signs and the nursing notes.  Pertinent labs & imaging results that were available during my care of the patient were reviewed by me and considered in my medical decision making (see chart for details).      Patient had improvement of wheezing symptoms with in office albuterol.  Flu testing was negative.  COVID-19 test is pending.  Discussed likely viral etiology given short duration of symptoms.  Given increased wheezing and cough we will treat for asthma exacerbation with prednisone taper.  Patient was instructed not to take NSAIDs with prednisone due to concern for GI bleeding.  She was prescribed Promethazine DM to be used up to twice a day as needed for cough with instruction not to drive or  drink alcohol with this medication as drowsiness is a common side effect.  Recommended she use over-the-counter medications including Mucinex, Tylenol, Flonase for additional symptom relief.  Discussed alarm symptoms or warrant emergent evaluation.  Strict return precautions given to which patient expressed understanding.  Final Clinical Impressions(s) / UC Diagnoses   Final diagnoses:  Upper respiratory tract infection, unspecified type  Cough  Mild intermittent asthma with acute exacerbation     Discharge Instructions      Your flu test was negative.  Your COVID test is pending.  We will start a prednisone taper.  Do not take NSAIDs including aspirin, ibuprofen/Advil, naproxen/Aleve with prednisone as it can cause stomach bleeding.  Use Promethazine DM up to twice a day as needed for cough.  Do not drive or drink alcohol with this as drowsiness is a common side effect.  You can use Tylenol for fever and pain.  Use your inhaler every 4-6 hours as needed for cough and shortness of breath.  If anything is worsening please return for reevaluation.  Follow-up with PCP within 1 week to ensure symptom improvement.     ED Prescriptions     Medication Sig Dispense Auth. Provider   predniSONE (STERAPRED UNI-PAK 21 TAB) 10 MG (21) TBPK tablet As directed 21 tablet Whisper Kurka K, PA-C   promethazine-dextromethorphan (PROMETHAZINE-DM) 6.25-15 MG/5ML syrup Take 5 mLs by mouth 2 (two) times daily as needed for cough. 118 mL Jacqui Headen K, PA-C      PDMP not reviewed this encounter.   Jeani Hawking, PA-C 09/27/20 1105

## 2020-09-27 NOTE — ED Triage Notes (Signed)
Pt is present with a cough, fatigue, HA, SOB, and chest tightness from coughing. Pt states that her sx started Monday night

## 2020-11-16 DIAGNOSIS — R053 Chronic cough: Secondary | ICD-10-CM | POA: Diagnosis not present

## 2020-11-16 DIAGNOSIS — Z6831 Body mass index (BMI) 31.0-31.9, adult: Secondary | ICD-10-CM | POA: Diagnosis not present

## 2020-11-16 DIAGNOSIS — Z1329 Encounter for screening for other suspected endocrine disorder: Secondary | ICD-10-CM | POA: Diagnosis not present

## 2020-11-16 DIAGNOSIS — M545 Low back pain, unspecified: Secondary | ICD-10-CM | POA: Diagnosis not present

## 2020-11-16 DIAGNOSIS — R7989 Other specified abnormal findings of blood chemistry: Secondary | ICD-10-CM | POA: Diagnosis not present

## 2020-11-16 DIAGNOSIS — R1031 Right lower quadrant pain: Secondary | ICD-10-CM | POA: Diagnosis not present

## 2020-11-16 DIAGNOSIS — Z1231 Encounter for screening mammogram for malignant neoplasm of breast: Secondary | ICD-10-CM | POA: Diagnosis not present

## 2020-11-16 DIAGNOSIS — Z01419 Encounter for gynecological examination (general) (routine) without abnormal findings: Secondary | ICD-10-CM | POA: Diagnosis not present

## 2020-11-16 DIAGNOSIS — Z13220 Encounter for screening for lipoid disorders: Secondary | ICD-10-CM | POA: Diagnosis not present

## 2020-11-16 DIAGNOSIS — R718 Other abnormality of red blood cells: Secondary | ICD-10-CM | POA: Diagnosis not present

## 2020-11-16 DIAGNOSIS — Z0000 Encounter for general adult medical examination without abnormal findings: Secondary | ICD-10-CM | POA: Diagnosis not present

## 2020-11-16 DIAGNOSIS — Z131 Encounter for screening for diabetes mellitus: Secondary | ICD-10-CM | POA: Diagnosis not present

## 2020-11-17 DIAGNOSIS — R7989 Other specified abnormal findings of blood chemistry: Secondary | ICD-10-CM | POA: Insufficient documentation

## 2020-11-22 DIAGNOSIS — R899 Unspecified abnormal finding in specimens from other organs, systems and tissues: Secondary | ICD-10-CM | POA: Insufficient documentation

## 2020-11-25 DIAGNOSIS — H52203 Unspecified astigmatism, bilateral: Secondary | ICD-10-CM | POA: Diagnosis not present

## 2020-11-25 DIAGNOSIS — H10413 Chronic giant papillary conjunctivitis, bilateral: Secondary | ICD-10-CM | POA: Diagnosis not present

## 2020-11-25 DIAGNOSIS — H524 Presbyopia: Secondary | ICD-10-CM | POA: Diagnosis not present

## 2020-12-02 DIAGNOSIS — R102 Pelvic and perineal pain: Secondary | ICD-10-CM | POA: Insufficient documentation

## 2020-12-07 DIAGNOSIS — R899 Unspecified abnormal finding in specimens from other organs, systems and tissues: Secondary | ICD-10-CM | POA: Diagnosis not present

## 2020-12-13 DIAGNOSIS — D509 Iron deficiency anemia, unspecified: Secondary | ICD-10-CM | POA: Diagnosis not present

## 2020-12-13 DIAGNOSIS — R102 Pelvic and perineal pain: Secondary | ICD-10-CM | POA: Diagnosis not present

## 2020-12-13 DIAGNOSIS — Z1211 Encounter for screening for malignant neoplasm of colon: Secondary | ICD-10-CM | POA: Diagnosis not present

## 2020-12-16 ENCOUNTER — Other Ambulatory Visit: Payer: Self-pay | Admitting: Obstetrics & Gynecology

## 2020-12-21 ENCOUNTER — Other Ambulatory Visit: Payer: Self-pay | Admitting: Obstetrics & Gynecology

## 2020-12-23 ENCOUNTER — Other Ambulatory Visit: Payer: Self-pay | Admitting: Obstetrics & Gynecology

## 2020-12-23 DIAGNOSIS — D508 Other iron deficiency anemias: Secondary | ICD-10-CM

## 2020-12-27 ENCOUNTER — Non-Acute Institutional Stay (HOSPITAL_COMMUNITY)
Admission: RE | Admit: 2020-12-27 | Discharge: 2020-12-27 | Disposition: A | Discharge status: Home / Self Care | Payer: BLUE CROSS/BLUE SHIELD | Source: Ambulatory Visit | Attending: Internal Medicine | Admitting: Internal Medicine

## 2020-12-27 ENCOUNTER — Other Ambulatory Visit: Payer: Self-pay

## 2020-12-27 DIAGNOSIS — D509 Iron deficiency anemia, unspecified: Secondary | ICD-10-CM | POA: Insufficient documentation

## 2020-12-27 MED ORDER — SODIUM CHLORIDE 0.9 % IV SOLN
500.0000 mg | Freq: Once | INTRAVENOUS | Status: AC
  Administered 2020-12-27: 500 mg via INTRAVENOUS
  Filled 2020-12-27: qty 25

## 2020-12-27 MED ORDER — DIPHENHYDRAMINE HCL 25 MG PO CAPS
25.0000 mg | ORAL_CAPSULE | Freq: Once | ORAL | Status: AC
  Administered 2020-12-27: 25 mg via ORAL
  Filled 2020-12-27: qty 1

## 2020-12-27 MED ORDER — SODIUM CHLORIDE 0.9 % IV SOLN
INTRAVENOUS | Status: DC | PRN
  Administered 2020-12-27: 250 mL via INTRAVENOUS

## 2020-12-27 MED ORDER — IBUPROFEN 100 MG/5ML PO SUSP
600.0000 mg | Freq: Once | ORAL | Status: AC
  Administered 2020-12-27: 600 mg via ORAL
  Filled 2020-12-27: qty 30

## 2020-12-27 NOTE — Progress Notes (Signed)
PATIENT CARE CENTER NOTE   Diagnosis: Other iron deficiency anemia (D50.8)   Provider: Shea Evans, MD   Procedure: Venofer infusion    Note:  Patient received Venofer 500 mg infusion (dose 1 of 2) via PIV. Pre-medications given per order (Ibuprofen suspension and Benadryl). Patient tolerated infusion well with no adverse reaction. Discharge instructions given. Patient to come back in 2 weeks for second infusion. Patient alert, oriented and ambulatory at discharge.

## 2020-12-30 ENCOUNTER — Other Ambulatory Visit: Payer: Self-pay

## 2020-12-30 ENCOUNTER — Ambulatory Visit (HOSPITAL_COMMUNITY)
Admission: EM | Admit: 2020-12-30 | Discharge: 2020-12-30 | Disposition: A | Discharge status: Home / Self Care | Payer: BLUE CROSS/BLUE SHIELD | Attending: Emergency Medicine | Admitting: Emergency Medicine

## 2020-12-30 ENCOUNTER — Encounter (HOSPITAL_COMMUNITY): Payer: Self-pay

## 2020-12-30 DIAGNOSIS — J209 Acute bronchitis, unspecified: Secondary | ICD-10-CM | POA: Diagnosis not present

## 2020-12-30 DIAGNOSIS — J4521 Mild intermittent asthma with (acute) exacerbation: Secondary | ICD-10-CM

## 2020-12-30 MED ORDER — ALBUTEROL SULFATE HFA 108 (90 BASE) MCG/ACT IN AERS: 1.0000 | INHALATION_SPRAY | Freq: Four times a day (QID) | RESPIRATORY_TRACT | 0 refills | Status: DC | PRN

## 2020-12-30 MED ORDER — PROMETHAZINE-DM 6.25-15 MG/5ML PO SYRP: 5.0000 mL | ORAL_SOLUTION | Freq: Two times a day (BID) | ORAL | 0 refills | Status: DC | PRN

## 2020-12-30 MED ORDER — PREDNISONE 20 MG PO TABS: 40.0000 mg | ORAL_TABLET | Freq: Every day | ORAL | 0 refills | Status: AC

## 2020-12-30 NOTE — ED Provider Notes (Signed)
[facility]    CSN: [id number] Arrival date & time: 12/30/20  1552      History   Chief Complaint Chief Complaint  Patient presents with   Cough   Shortness of Breath    HPI Brenda Bell is a 50 y.o. female.   Patient here for evaluation of cough, congestion, and shortness of breath that started yesterday.  Denies any recent sick contacts.  Reports history of asthma and has been using albuterol inhaler.  Reports some chest tightness and pain when coughing.  Denies any trauma, injury, or other precipitating event.  Denies any specific alleviating or aggravating factors.  Denies any fevers, N/V/D, numbness, tingling, weakness, abdominal pain, or headaches.    The history is provided by the patient.  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    Past Surgical History:  Procedure Laterality Date   BREAST SURGERY Left    cyst removed   CESAREAN SECTION     CHOLECYSTECTOMY     KNEE SURGERY Right    tendon repair   LAPAROSCOPIC APPENDECTOMY N/A 10/26/2016   Procedure: APPENDECTOMY LAPAROSCOPIC;  Surgeon: Berna Bue, MD;  Location: [facility] OR;  Service: General;  Laterality: N/A;   OVARY SURGERY Right    benign tumor of right ovary removed, 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 Authorizing Provider  predniSONE (DELTASONE) 20 MG tablet Take 2 tablets (40 mg total) by mouth daily for 5 days. 12/30/20 01/04/21 Yes Ivette Loyal, NP  acetaminophen (TYLENOL) 325 MG tablet Take 650 mg by mouth every 6 (six) hours as needed.    [provider]  albuterol (VENTOLIN HFA) 108 (90 Base) MCG/ACT inhaler Inhale 1-2 puffs into the lungs every 6 (six) hours as needed for wheezing or shortness of breath. 12/30/20   Ivette Loyal, NP  cetirizine (ZYRTEC) 10 MG tablet Take 1 tablet (10 mg total) by mouth daily. 09/23/19   Arvilla Market, MD  fluticasone (FLONASE) 50 MCG/ACT nasal spray Place 2 sprays into both nostrils daily. 09/23/19   Arvilla Market, MD  promethazine-dextromethorphan (PROMETHAZINE-DM) 6.25-15 MG/5ML syrup Take 5 mLs by mouth 2 (two) times daily as needed for cough. 12/30/20   Ivette Loyal, NP    Family History Family History  Problem Relation Age of Onset  Hypertension Mother    Diabetes Father     Social History Social History   Tobacco Use   Smoking status: Never   Smokeless tobacco: Never  Substance Use Topics   Alcohol use: No    Alcohol/week: 0.0 standard drinks   Drug use: No     Allergies   Hydrocodone-acetaminophen   Review of Systems Review of Systems  Constitutional:  Negative for fever.  HENT:  Positive for congestion.   Respiratory:  Positive for cough, chest tightness and shortness of breath.   All other systems reviewed and are negative.   Physical Exam Triage Vital Signs ED Triage Vitals  Enc Vitals Group     BP 12/30/20 1605 107/74     Pulse Rate 12/30/20 1605 77     Resp 12/30/20 1605 17     Temp 12/30/20 1605 98.8 F (37.1 C)     Temp Source 12/30/20 1605 Oral     SpO2 12/30/20 1605 99 %     Weight --      Height --      Head Circumference --      Peak Flow  --      Pain Score 12/30/20 1608 2     Pain Loc --      Pain Edu? --      Excl. in GC? --    No data found.  Updated Vital Signs BP 107/74 (BP Location: Right Arm)   Pulse 77   Temp 98.8 F (37.1 C) (Oral)   Resp 17   LMP 11/13/2019   SpO2 99%   Visual Acuity Right Eye Distance:   Left Eye Distance:   Bilateral Distance:    Right Eye Near:   Left Eye Near:    Bilateral Near:     Physical Exam Vitals and nursing note reviewed.  Constitutional:      General: She is not in acute distress.    Appearance: Normal appearance. She is not ill-appearing, toxic-appearing or diaphoretic.  HENT:     Head: Normocephalic and atraumatic.     Nose: Congestion and rhinorrhea present. Rhinorrhea is clear.     Right Turbinates: Swollen.     Left Turbinates: Swollen.     Mouth/Throat:     Mouth: Mucous membranes are moist.     Pharynx: Oropharynx is clear.  Eyes:     Conjunctiva/sclera: Conjunctivae normal.  Cardiovascular:     Rate and Rhythm: Normal rate and regular rhythm.     Pulses: Normal pulses.     Heart sounds: Normal heart sounds.  Pulmonary:     Effort: Pulmonary effort is normal.     Breath sounds: Examination of the right-upper field reveals decreased breath sounds. Examination of the left-upper field reveals decreased breath sounds. Decreased breath sounds present. No wheezing.  Abdominal:     General: Abdomen is flat.  Musculoskeletal:        General: Normal range of motion.     Cervical back: Normal range of motion.  Skin:    General: Skin is warm and dry.  Neurological:     General: No focal deficit present.     Mental Status: She is alert and oriented to person, place, and time.  Psychiatric:        Mood and Affect: Mood normal.     UC Treatments / Results  Labs (all labs ordered are listed, but only abnormal results are displayed) Labs Reviewed - No data to display  EKG   Radiology No  results found.  Procedures Procedures (including critical  care time)  Medications Ordered in UC Medications - No data to display  Initial Impression / Assessment and Plan / UC Course  I have reviewed the triage vital signs and the nursing notes.  Pertinent labs & imaging results that were available during my care of the patient were reviewed by me and considered in my medical decision making (see chart for details).    Assessment negative for red flags or concerns.  Acute bronchitis versus asthma exacerbation.  Declines any flu or COVID testing at this time.  We will treat with prednisone daily.  May take Promethazine DM as needed for cough but instructed not to take it prior to driving as it can cause drowsiness.  Albuterol inhaler refilled.  Encourage fluids and rest.  Discussed conservative symptom management as described discharge instructions.  Follow-up with primary care provider for reevaluation of soon as possible. Final Clinical Impressions(s) / UC Diagnoses   Final diagnoses:  Acute bronchitis, unspecified organism  Mild intermittent asthma with exacerbation     Discharge Instructions      Take the prednisone daily starting tomorrow.  Take it in the morning. Take the Promethazine DM as needed for cough.  Do not take this before to driving as it can make you sleepy.  Use your albuterol inhaler as needed for wheezing and shortness of breath.  You can take Tylenol and/or ibuprofen as needed for pain relief and fever reduction.  For cough: honey 1/2 to 1 teaspoon (you can dilute the honey in water or another fluid).  You can also use guaifenesin and dextromethorphan for cough. You can use a humidifier for chest congestion and cough.  If you don't have a humidifier, you can sit in the bathroom with the hot shower running.      For congestion: take a daily anti-histamine like Zyrtec, Claritin, and a oral decongestant, such as pseudoephedrine.  You can also use Flonase 1-2 sprays in each nostril daily.    It is important to stay  hydrated: drink plenty of fluids (water, gatorade/powerade/pedialyte, juices, or teas)   Return or go to the Emergency Department if symptoms worsen or do not improve in the next few days.  Follow-up with your primary care provider for reevaluation as soon as possible.     ED Prescriptions     Medication Sig Dispense Auth. Provider   predniSONE (DELTASONE) 20 MG tablet Take 2 tablets (40 mg total) by mouth daily for 5 days. 10 tablet Ivette Loyal, NP   promethazine-dextromethorphan (PROMETHAZINE-DM) 6.25-15 MG/5ML syrup Take 5 mLs by mouth 2 (two) times daily as needed for cough. 118 mL Ivette Loyal, NP   albuterol (VENTOLIN HFA) 108 (90 Base) MCG/ACT inhaler Inhale 1-2 puffs into the lungs every 6 (six) hours as needed for wheezing or shortness of breath. 18 g Ivette Loyal, NP      PDMP not reviewed this encounter.   Ivette Loyal, NP 12/30/20 [phone]

## 2020-12-30 NOTE — Discharge Instructions (Signed)
Take the prednisone daily starting tomorrow.  Take it in the morning. Take the Promethazine DM as needed for cough.  Do not take this before to driving as it can make you sleepy.  Use your albuterol inhaler as needed for wheezing and shortness of breath.  You can take Tylenol and/or ibuprofen as needed for pain relief and fever reduction.  For cough: honey 1/2 to 1 teaspoon (you can dilute the honey in water or another fluid).  You can also use guaifenesin and dextromethorphan for cough. You can use a humidifier for chest congestion and cough.  If you don't have a humidifier, you can sit in the bathroom with the hot shower running.      For congestion: take a daily anti-histamine like Zyrtec, Claritin, and a oral decongestant, such as pseudoephedrine.  You can also use Flonase 1-2 sprays in each nostril daily.    It is important to stay hydrated: drink plenty of fluids (water, gatorade/powerade/pedialyte, juices, or teas)   Return or go to the Emergency Department if symptoms worsen or do not improve in the next few days.  Follow-up with your primary care provider for reevaluation as soon as possible.

## 2020-12-30 NOTE — ED Triage Notes (Signed)
Pt presents with productive cough and shortness of breath since yesterday.

## 2021-01-10 ENCOUNTER — Non-Acute Institutional Stay (HOSPITAL_COMMUNITY)
Admission: RE | Admit: 2021-01-10 | Discharge: 2021-01-10 | Disposition: A | Discharge status: Home / Self Care | Payer: BLUE CROSS/BLUE SHIELD | Source: Ambulatory Visit | Attending: Internal Medicine | Admitting: Internal Medicine

## 2021-01-10 ENCOUNTER — Other Ambulatory Visit: Payer: Self-pay

## 2021-01-10 DIAGNOSIS — D508 Other iron deficiency anemias: Secondary | ICD-10-CM | POA: Insufficient documentation

## 2021-01-10 MED ORDER — IBUPROFEN 100 MG/5ML PO SUSP
600.0000 mg | Freq: Once | ORAL | Status: AC
  Administered 2021-01-10: 600 mg via ORAL
  Filled 2021-01-10: qty 30

## 2021-01-10 MED ORDER — SODIUM CHLORIDE 0.9 % IV SOLN
500.0000 mg | Freq: Once | INTRAVENOUS | Status: AC
  Administered 2021-01-10: 500 mg via INTRAVENOUS
  Filled 2021-01-10: qty 25

## 2021-01-10 MED ORDER — SODIUM CHLORIDE 0.9 % IV SOLN
INTRAVENOUS | Status: DC | PRN
  Administered 2021-01-10: 250 mL via INTRAVENOUS

## 2021-01-10 MED ORDER — DIPHENHYDRAMINE HCL 25 MG PO CAPS
25.0000 mg | ORAL_CAPSULE | Freq: Once | ORAL | Status: AC
  Administered 2021-01-10: 25 mg via ORAL
  Filled 2021-01-10: qty 1

## 2021-01-10 NOTE — Progress Notes (Signed)
PATIENT CARE CENTER NOTE     Diagnosis: Other iron deficiency anemia (D50.8)     Provider: Shea Evans, MD     Procedure: Venofer infusion      Note:  Patient received Venofer 500 mg infusion (dose 2 of 2) via PIV. Pre-medications given per order (Ibuprofen suspension and Benadryl). Patient tolerated infusion well with no adverse reaction. AVS offered but patient refused. Patient alert, oriented and ambulatory at discharge.

## 2021-01-11 LAB — HM MAMMOGRAPHY

## 2021-02-22 DIAGNOSIS — R778 Other specified abnormalities of plasma proteins: Secondary | ICD-10-CM | POA: Diagnosis not present

## 2021-03-06 DIAGNOSIS — N920 Excessive and frequent menstruation with regular cycle: Secondary | ICD-10-CM | POA: Diagnosis not present

## 2021-03-06 DIAGNOSIS — R102 Pelvic and perineal pain: Secondary | ICD-10-CM | POA: Diagnosis not present

## 2021-03-06 DIAGNOSIS — Z130 Encounter for screening for diseases of the blood and blood-forming organs and certain disorders involving the immune mechanism: Secondary | ICD-10-CM | POA: Diagnosis not present

## 2021-04-04 ENCOUNTER — Ambulatory Visit: Payer: BLUE CROSS/BLUE SHIELD | Admitting: Nurse Practitioner

## 2021-04-04 ENCOUNTER — Other Ambulatory Visit: Payer: Self-pay

## 2021-04-04 ENCOUNTER — Encounter: Payer: Self-pay | Admitting: Nurse Practitioner

## 2021-04-04 VITALS — BP 126/76 | HR 73 | Temp 97.9°F | Ht 65.45 in | Wt 194.0 lb

## 2021-04-04 DIAGNOSIS — Z0000 Encounter for general adult medical examination without abnormal findings: Secondary | ICD-10-CM | POA: Diagnosis not present

## 2021-04-04 DIAGNOSIS — N920 Excessive and frequent menstruation with regular cycle: Secondary | ICD-10-CM | POA: Insufficient documentation

## 2021-04-04 DIAGNOSIS — N898 Other specified noninflammatory disorders of vagina: Secondary | ICD-10-CM | POA: Insufficient documentation

## 2021-04-04 DIAGNOSIS — R1012 Left upper quadrant pain: Secondary | ICD-10-CM

## 2021-04-04 DIAGNOSIS — M542 Cervicalgia: Secondary | ICD-10-CM | POA: Insufficient documentation

## 2021-04-04 DIAGNOSIS — E669 Obesity, unspecified: Secondary | ICD-10-CM | POA: Insufficient documentation

## 2021-04-04 DIAGNOSIS — D649 Anemia, unspecified: Secondary | ICD-10-CM | POA: Insufficient documentation

## 2021-04-04 DIAGNOSIS — E559 Vitamin D deficiency, unspecified: Secondary | ICD-10-CM | POA: Insufficient documentation

## 2021-04-04 LAB — POCT URINALYSIS DIP (CLINITEK)
Bilirubin, UA: NEGATIVE
Blood, UA: NEGATIVE
Glucose, UA: NEGATIVE mg/dL
Ketones, POC UA: NEGATIVE mg/dL
Leukocytes, UA: NEGATIVE
Nitrite, UA: NEGATIVE
POC PROTEIN,UA: NEGATIVE
Spec Grav, UA: >=1.03 — AB (ref 1.010–1.025)
Urobilinogen, UA: 0.2 E.U./dL
pH, UA: 6 (ref 5.0–8.0)

## 2021-04-04 LAB — GLUCOSE, POCT (MANUAL RESULT ENTRY): POC Glucose: 112 mg/dl — AB (ref 70–99)

## 2021-04-04 LAB — POCT GLYCOSYLATED HEMOGLOBIN (HGB A1C)
HbA1c POC (<> result, manual entry): 5.8 % (ref 4.0–5.6)
HbA1c, POC (controlled diabetic range): 5.8 % (ref 0.0–7.0)
HbA1c, POC (prediabetic range): 5.8 % (ref 5.7–6.4)
Hemoglobin A1C: 5.8 % — AB (ref 4.0–5.6)

## 2021-04-04 NOTE — Patient Instructions (Signed)
You were seen today in the [facility] to establish PCP and abdominal pain. Labs were collected, results will be available via MyChart or, if abnormal, you will be contacted by clinic staff. You were prescribed medications, please take as directed. Please follow up in 1 mth for reevaluation and to review results.

## 2021-04-04 NOTE — Progress Notes (Signed)
[name], [place]  [address] Phone:  [phone]   Fax:  [phone] Subjective:   Patient ID: Brenda Bell, female    DOB: Aug 23, 1970, 51 y.o.   MRN: [id number]  Chief Complaint  Patient presents with   Establish Care    Pt is here today to establish care.  Pt states that she was getting iron infusion in the past here at the day hospital and she is feeling better now.     HPI ESTEPHANIA Bell 51 y.o. female  has a past medical history of Anemia, B12 deficiency, Gastritis, and Vitamin D deficiency. To the [facility] to establish PCP and for abdominal pain.   Patient states that last visit with PCP 1 yr ago, since that time she has been going to Ob/gyn for regular medical visits.   Currently concerned about post Covid complications. States that she has had Covid twice in the past and since that time has had numbness in left upper back and an intermittent non productive cough. Patient questioning if she needs to follow up with specialist for further evaluation. Patient states that the numbness occurs intermittently, with some pain. Uncertain if the symptoms in her back are related to past Covid infection or from muscle strain from working.  Patient verbalizes chronic RLQ abdominal pain, which she describes as cramping. Pain worsened after removal of appendix 2 yrs ago. Unable to lay on left side due to exacerbation of abdominal cramping. Informed by ob/gyn, that symptoms are related to increased scar tissue from previous C sections. Does not take any medications for symptoms. Denies any nausea, vomiting or diarrhea. Denies any fever. Last bowel movement yesterday morning, without complication. Also endorses having perceived intermittent swelling in the LUQ x 6 mths, concerned it may be related to a problem with liver due taking many medications.  Denies any other complaints today. Denies any fatigue, chest pain, shortness of breath, HA or  dizziness. Denies any blurred vision, numbness or tingling.  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   TONSILLECTOMY Bilateral     Family History  Problem Relation Age of Onset   Hypertension Mother    Diabetes Father     Social History   Socioeconomic History   Marital status: Married    Spouse name: Not on file   Number of children: Not on file   Years of education: Not on file   Highest education level: Not on file  Occupational History   Not on file  Tobacco Use   Smoking status: Never   Smokeless tobacco: Never  Vaping Use   Vaping Use: Never used  Substance and Sexual Activity   Alcohol use: No    Alcohol/week: 0.0 standard drinks   Drug use: No   Sexual activity: Yes    Birth control/protection: None  Other Topics  Concern   Not on file  Social History Narrative   Not on file   Social Determinants of Health   Financial Resource Strain: Not on file  Food Insecurity: Not on file  Transportation Needs: Not on file  Physical Activity: Not on file  Stress: Not on file  Social Connections: Not on file  Intimate Partner Violence: Not on file    Outpatient Medications Prior to Visit  Medication Sig Dispense Refill   albuterol (VENTOLIN HFA) 108 (90 Base) MCG/ACT inhaler Inhale 1-2 puffs into the lungs every 6 (six) hours as needed for wheezing or shortness of breath. 18 g 0   ferrous sulfate 325 (65 FE) MG tablet Take 1 tablet by mouth daily.     fluticasone  (FLONASE) 50 MCG/ACT nasal spray Place 2 sprays into both nostrils daily. 9.9 mL 2   promethazine-dextromethorphan (PROMETHAZINE-DM) 6.25-15 MG/5ML syrup Take 5 mLs by mouth 2 (two) times daily as needed for cough. 118 mL 0   acetaminophen (TYLENOL) 325 MG tablet Take 650 mg by mouth every 6 (six) hours as needed. (Patient not taking: Reported on 04/04/2021)     cetirizine (ZYRTEC) 10 MG tablet Take 1 tablet (10 mg total) by mouth daily. (Patient not taking: Reported on 04/04/2021) 30 tablet 11   ferrous sulfate 325 (65 FE) MG tablet Take 325 mg by mouth daily.     No facility-administered medications prior to visit.    Allergies  Allergen Reactions   Hydrocodone-Acetaminophen Nausea And Vomiting    Decreased b/p    Review of Systems  Constitutional:  Negative for chills, fever and malaise/fatigue.  HENT: Negative.    Eyes: Negative.   Respiratory:  Negative for cough and shortness of breath.   Cardiovascular:  Negative for chest pain, palpitations and leg swelling.  Gastrointestinal:  Positive for abdominal pain. Negative for blood in stool, constipation, diarrhea, nausea and vomiting.  Musculoskeletal:  Positive for back pain. Negative for falls, joint pain, myalgias and neck pain.       Left thoracic diffuse perispinous numbness   Skin: Negative.   Neurological:  Positive for sensory change.  Psychiatric/Behavioral:  Negative for depression. The patient is not nervous/anxious.   All other systems reviewed and are negative.     Objective:    Physical Exam Constitutional:      General: She is not in acute distress.    Appearance: Normal appearance. She is obese. She is not ill-appearing.  HENT:     Head: Normocephalic.     Right Ear: Tympanic membrane, ear canal and external ear normal.     Left Ear: Tympanic membrane, ear canal and external ear normal.     Nose: Nose normal.     Mouth/Throat:     Mouth: Mucous membranes are moist.     Pharynx: Oropharynx is clear.  Eyes:      Extraocular Movements: Extraocular movements intact.     Conjunctiva/sclera: Conjunctivae normal.     Pupils: Pupils are equal, round, and reactive to light.  Neck:     Vascular: No carotid bruit.  Cardiovascular:     Rate and Rhythm: Normal rate and regular rhythm.     Pulses: Normal pulses.     Heart sounds: Normal heart sounds.     Comments: No obvious peripheral edema Pulmonary:     Effort: Pulmonary effort is normal.     Breath sounds: Normal breath sounds.  Abdominal:     General: Abdomen is flat. Bowel sounds are normal. There  is no distension.     Palpations: There is no mass.     Tenderness: There is abdominal tenderness. There is no right CVA tenderness, left CVA tenderness, guarding or rebound.     Hernia: No hernia is present.     Comments: Mild to moderate tenderness with palpation of LUQ  Musculoskeletal:        General: No swelling, tenderness, deformity or signs of injury. Normal range of motion.     Cervical back: Normal range of motion and neck supple. No rigidity or tenderness.     Right lower leg: No edema.     Left lower leg: No edema.     Comments: Diffuse back non tender to palpation  Lymphadenopathy:     Cervical: No cervical adenopathy.  Skin:    General: Skin is warm and dry.     Capillary Refill: Capillary refill takes less than 2 seconds.  Neurological:     General: No focal deficit present.     Mental Status: She is alert and oriented to person, place, and time.  Psychiatric:        Mood and Affect: Mood normal.        Behavior: Behavior normal.        Thought Content: Thought content normal.        Judgment: Judgment normal.    BP 126/76    Pulse 73    Temp 97.9 F (36.6 C)    Ht 5' 5.45" (1.662 m)    Wt 194 lb (88 kg)    LMP 11/13/2019    SpO2 98%    BMI 31.84 kg/m  Wt Readings from Last 3 Encounters:  04/04/21 194 lb (88 kg)  10/27/19 191 lb (86.6 kg)  06/02/19 182 lb (82.6 kg)    Immunization History  Administered Date(s)  Administered   Influenza Split 01/29/2013   Influenza Whole 01/17/2007   Influenza, Quadrivalent, Recombinant, Inj, Pf 02/02/2019   Influenza,inj,Quad PF,6+ Mos 01/20/2015, 02/09/2016, 02/25/2017, 01/23/2018   Influenza-Unspecified 02/14/2021   Pneumococcal Polysaccharide-23 01/17/2007   Td 06/01/2006, 06/18/2006   Tdap 12/25/2012    Diabetic Foot Exam - Simple   No data filed     Lab Results  Component Value Date   TSH 3.48 05/03/2015   Lab Results  Component Value Date   WBC 3.6 09/24/2019   HGB 10.8 (L) 09/24/2019   HCT 35.8 09/24/2019   MCV 82 09/24/2019   PLT 239 09/24/2019   Lab Results  Component Value Date   NA 139 03/04/2019   K 3.9 03/04/2019   CO2 23 03/04/2019   GLUCOSE 89 03/04/2019   BUN 7 03/04/2019   CREATININE 0.49 (L) 03/04/2019   BILITOT 0.4 03/04/2019   ALKPHOS 77 03/04/2019   AST 34 03/04/2019   ALT 41 (H) 03/04/2019   PROT 7.0 03/04/2019   ALBUMIN 4.0 03/04/2019   CALCIUM 8.6 (L) 03/04/2019   ANIONGAP 6 02/26/2017   Lab Results  Component Value Date   CHOL 95 (L) 01/20/2015   CHOL 105 01/29/2013   CHOL 89 01/17/2007   Lab Results  Component Value Date   HDL 23 (L) 01/20/2015   HDL 34 (L) 01/29/2013   HDL 31 (L) 01/17/2007   Lab Results  Component Value Date   LDLCALC 54 01/20/2015   LDLCALC 59 01/29/2013   LDLCALC 46 01/17/2007   Lab Results  Component Value Date   TRIG 90 01/20/2015   TRIG 62 01/29/2013   TRIG 58 01/17/2007  Lab Results  Component Value Date   CHOLHDL 4.1 01/20/2015   CHOLHDL 3.1 01/29/2013   CHOLHDL 2.9 Ratio 01/17/2007   Lab Results  Component Value Date   HGBA1C 5.8 (A) 04/04/2021   HGBA1C 5.8 04/04/2021   HGBA1C 5.8 04/04/2021   HGBA1C 5.8 04/04/2021       Assessment & Plan:   Problem List Items Addressed This Visit   None Visit Diagnoses     Left upper quadrant abdominal pain    -  Primary   Relevant Orders   Lipase   CT Abdomen Pelvis W Contrast, after completion of impression  may require referral to GI Informed to take OTC medications as needed for symptoms   Healthcare maintenance       Relevant Orders   HgB A1c (Completed)   POCT URINALYSIS DIP (CLINITEK) (Completed)   Glucose (CBG) (Completed)   CBC with Differential/Platelet   Comprehensive metabolic panel   Lipid panel Encouraged continued diet and exercise efforts  Encouraged continued compliance with medication     Follow up in 1 mth for reevaluation of symptoms and to review results, sooner as needed     I am having Verdis Frederickson A. Monarrez maintain her fluticasone, cetirizine, acetaminophen, promethazine-dextromethorphan, albuterol, and ferrous sulfate.  No orders of the defined types were placed in this encounter.    Teena Dunk, NP

## 2021-04-05 LAB — CBC WITH DIFFERENTIAL/PLATELET
Basophils Absolute: 0 10*3/uL (ref 0.0–0.2)
Basos: 1 %
EOS (ABSOLUTE): 0.1 10*3/uL (ref 0.0–0.4)
Eos: 4 %
Hematocrit: 40 % (ref 34.0–46.6)
Hemoglobin: 12.8 g/dL (ref 11.1–15.9)
Immature Grans (Abs): 0 10*3/uL (ref 0.0–0.1)
Immature Granulocytes: 0 %
Lymphocytes Absolute: 1.7 10*3/uL (ref 0.7–3.1)
Lymphs: 41 %
MCH: 28 pg (ref 26.6–33.0)
MCHC: 32 g/dL (ref 31.5–35.7)
MCV: 88 fL (ref 79–97)
Monocytes Absolute: 0.6 10*3/uL (ref 0.1–0.9)
Monocytes: 15 %
Neutrophils Absolute: 1.5 10*3/uL (ref 1.4–7.0)
Neutrophils: 39 %
Platelets: 227 10*3/uL (ref 150–450)
RBC: 4.57 x10E6/uL (ref 3.77–5.28)
RDW: 16.9 % — ABNORMAL HIGH (ref 11.7–15.4)
WBC: 4 10*3/uL (ref 3.4–10.8)

## 2021-04-05 LAB — COMPREHENSIVE METABOLIC PANEL
ALT: 29 IU/L (ref 0–32)
AST: 26 IU/L (ref 0–40)
Albumin/Globulin Ratio: 1.4 (ref 1.2–2.2)
Albumin: 4.1 g/dL (ref 3.8–4.8)
Alkaline Phosphatase: 60 IU/L (ref 44–121)
BUN/Creatinine Ratio: 22 (ref 9–23)
BUN: 13 mg/dL (ref 6–24)
Bilirubin Total: 0.4 mg/dL (ref 0.0–1.2)
CO2: 25 mmol/L (ref 20–29)
Calcium: 8.8 mg/dL (ref 8.7–10.2)
Chloride: 105 mmol/L (ref 96–106)
Creatinine, Ser: 0.59 mg/dL (ref 0.57–1.00)
Globulin, Total: 2.9 g/dL (ref 1.5–4.5)
Glucose: 103 mg/dL — ABNORMAL HIGH (ref 70–99)
Potassium: 4.1 mmol/L (ref 3.5–5.2)
Sodium: 140 mmol/L (ref 134–144)
Total Protein: 7 g/dL (ref 6.0–8.5)
eGFR: 111 mL/min/{1.73_m2} (ref >59)

## 2021-04-05 LAB — LIPID PANEL
Chol/HDL Ratio: 4 ratio (ref 0.0–4.4)
Cholesterol, Total: 135 mg/dL (ref 100–199)
HDL: 34 mg/dL — ABNORMAL LOW (ref >39)
LDL Chol Calc (NIH): 79 mg/dL (ref 0–99)
Triglycerides: 121 mg/dL (ref 0–149)
VLDL Cholesterol Cal: 22 mg/dL (ref 5–40)

## 2021-04-05 LAB — LIPASE: Lipase: 31 U/L (ref 14–72)

## 2021-04-06 LAB — CBC WITH DIFFERENTIAL/PLATELET

## 2021-04-06 LAB — COMPREHENSIVE METABOLIC PANEL

## 2021-04-06 LAB — LIPID PANEL

## 2021-05-08 ENCOUNTER — Encounter: Payer: Self-pay | Admitting: Nurse Practitioner

## 2021-05-08 ENCOUNTER — Ambulatory Visit: Payer: PRIVATE HEALTH INSURANCE | Admitting: Nurse Practitioner

## 2021-05-08 ENCOUNTER — Other Ambulatory Visit: Payer: Self-pay

## 2021-05-08 VITALS — BP 128/85 | HR 70 | Temp 98.2°F | Ht 65.45 in | Wt 193.0 lb

## 2021-05-08 DIAGNOSIS — R1012 Left upper quadrant pain: Secondary | ICD-10-CM

## 2021-05-08 DIAGNOSIS — K5909 Other constipation: Secondary | ICD-10-CM

## 2021-05-08 DIAGNOSIS — D509 Iron deficiency anemia, unspecified: Secondary | ICD-10-CM

## 2021-05-08 MED ORDER — POLYETHYLENE GLYCOL 3350 17 GM/SCOOP PO POWD: 17.0000 g | Freq: Every day | ORAL | 1 refills | Status: DC

## 2021-05-08 NOTE — Progress Notes (Signed)
[place], [place]  [address] Phone:  [phone]   Fax:  [phone] Subjective:   Patient ID: Brenda Bell, female    DOB: October 04, 1970, 51 y.o.   MRN: [id number]  Chief Complaint  Patient presents with   Follow-up    Pt is here for follow up visit. Pt has no question or concerns.   HPI Brenda Bell 51 y.o. female  has a past medical history of Anemia, B12 deficiency, Gastritis, and Vitamin D deficiency. To the [facility] for follow up of abdominal pain.  Sometimes when laying down on left side, has cramping in the RLQ. Has had some improvement since last visit with massaging affected areas. Denies any pain today. Has not had CT imaging, would like to wait a month prior to imaging recently started a new job and does not have insurance.  Patient requesting information about previous lab results from visit earlier this month. Also questioning whether she needs to continue taking iron supplements for anemia, it often constipates her. Requesting information about current iron levels. Denies any other complaints today.  Denies any fatigue, chest pain, shortness of breath, HA or dizziness. Denies any blurred vision, numbness or tingling.  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   TONSILLECTOMY Bilateral     Family History  Problem Relation Age of Onset   Hypertension Mother    Diabetes Father     Social History   Socioeconomic History    Marital status: Married    Spouse name: Not on file   Number of children: Not on file   Years of education: Not on file   Highest education level: Not on file  Occupational History   Not on file  Tobacco Use   Smoking status: Never   Smokeless tobacco: Never  Vaping Use   Vaping Use: Never used  Substance and Sexual Activity   Alcohol use: No    Alcohol/week: 0.0 standard drinks   Drug use: No   Sexual activity: Yes    Birth control/protection: None  Other Topics Concern   Not on file  Social History Narrative   Not on file   Social Determinants of Health   Financial Resource Strain: Not on file  Food Insecurity: Not on file  Transportation Needs: Not on file  Physical Activity: Not on file  Stress: Not on file  Social Connections: Not on file  Intimate Partner Violence: Not on file    Outpatient Medications Prior to Visit  Medication Sig Dispense Refill   albuterol (VENTOLIN HFA) 108 (90 Base) MCG/ACT inhaler Inhale 1-2 puffs into the lungs every 6 (six) hours as needed for wheezing or shortness of breath. 18 g 0   cetirizine (ZYRTEC) 10 MG tablet Take 1 tablet (10 mg total) by mouth daily. [place]  65 FE) MG tablet Take 1 tablet by mouth daily.    ° Multiple Vitamin (MULTIVITAMIN) tablet Take 1 tablet by mouth daily.    ° promethazine-dextromethorphan (PROMETHAZINE-DM) 6.25-15 MG/5ML syrup Take 5 mLs by mouth 2 (two) times daily as needed for cough. 118 mL 0  ° acetaminophen (TYLENOL) 325 MG tablet Take 650 mg by mouth every 6 (six) hours as needed. (Patient not taking: Reported on 05/08/2021)    ° fluticasone (FLONASE) 50 MCG/ACT nasal spray Place 2 sprays into both nostrils daily. (Patient not taking: Reported on 05/08/2021) 9.9 mL 2  ° °No facility-administered medications prior to visit.  ° ° °Allergies  °Allergen Reactions  ° Hydrocodone-Acetaminophen Nausea And Vomiting  °  Decreased b/p  ° ° °Review of Systems  °Constitutional:  Negative for  chills, fever and malaise/fatigue.  °Respiratory:  Negative for cough and shortness of breath.   °Cardiovascular:  Negative for chest pain, palpitations and leg swelling.  °Gastrointestinal:  Positive for abdominal pain and constipation. Negative for blood in stool, diarrhea, nausea and vomiting.  °Genitourinary: Negative.   °Skin: Negative.   °Neurological: Negative.   °Psychiatric/Behavioral:  Negative for depression. The patient is not nervous/anxious.   °All other systems reviewed and are negative. ° °   °Objective:  °  °Physical Exam °Vitals reviewed.  °Constitutional:   °   General: She is not in acute distress. °   Appearance: Normal appearance. She is normal weight.  °HENT:  °   Head: Normocephalic.  °Cardiovascular:  °   Rate and Rhythm: Normal rate and regular rhythm.  °   Pulses: Normal pulses.  °   Heart sounds: Normal heart sounds.  °   Comments: No obvious peripheral edema °Pulmonary:  °   Effort: Pulmonary effort is normal.  °   Breath sounds: Normal breath sounds.  °Abdominal:  °   General: Abdomen is flat. Bowel sounds are normal. There is no distension.  °   Palpations: Abdomen is soft. There is no mass.  °   Tenderness: There is no abdominal tenderness. There is no right CVA tenderness, left CVA tenderness, guarding or rebound.  °   Hernia: No hernia is present.  °Skin: °   General: Skin is warm and dry.  °   Capillary Refill: Capillary refill takes less than 2 seconds.  °Neurological:  °   General: No focal deficit present.  °   Mental Status: She is alert and oriented to person, place, and time.  °Psychiatric:     °   Mood and Affect: Mood normal.     °   Behavior: Behavior normal.     °   Thought Content: Thought content normal.     °   Judgment: Judgment normal.  ° ° °BP 128/85    Pulse 70    Temp 98.2 °F (36.8 °C)    Ht 5' 5.45" (1.662 m)    Wt 193 lb (87.5 kg)    LMP 11/13/2019    SpO2 100%    BMI 31.68 kg/m²  °Wt Readings from Last 3 Encounters:  °05/08/21 193 lb (87.5 kg)  °04/04/21 194 lb  (88 kg)  °10/27/19 191 lb (86.6 kg)  ° ° °Immunization History  °Administered Date(s) Administered  ° Influenza Split 01/29/2013  ° Influenza Whole 01/17/2007  ° Influenza, Quadrivalent, Recombinant, Inj, Pf 02/02/2019  ° Influenza,inj,Quad PF,6+ Mos 01/20/2015, 02/09/2016, 02/25/2017, 01/23/2018  ° Influenza-Unspecified 02/14/2021  ° Pneumococcal Polysaccharide-23 01/17/2007  ° Td 06/01/2006, 06/18/2006  °   Tdap 12/25/2012  ° ° °Diabetic Foot Exam - Simple   °No data filed °  ° ° °Lab Results  °Component Value Date  ° TSH 3.48 05/03/2015  ° °Lab Results  °Component Value Date  ° WBC 4.0 04/04/2021  ° HGB 12.8 04/04/2021  ° HCT 40.0 04/04/2021  ° MCV 88 04/04/2021  ° PLT 227 04/04/2021  ° °Lab Results  °Component Value Date  ° NA 140 04/04/2021  ° K 4.1 04/04/2021  ° CO2 25 04/04/2021  ° GLUCOSE 103 (H) 04/04/2021  ° BUN 13 04/04/2021  ° CREATININE 0.59 04/04/2021  ° BILITOT 0.4 04/04/2021  ° ALKPHOS 60 04/04/2021  ° AST 26 04/04/2021  ° ALT 29 04/04/2021  ° PROT 7.0 04/04/2021  ° ALBUMIN 4.1 04/04/2021  ° CALCIUM 8.8 04/04/2021  ° ANIONGAP 6 02/26/2017  ° EGFR 111 04/04/2021  ° °Lab Results  °Component Value Date  ° CHOL 135 04/04/2021  ° CHOL CANCELED 04/04/2021  ° CHOL 95 (L) 01/20/2015  ° °Lab Results  °Component Value Date  ° HDL 34 (L) 04/04/2021  ° HDL CANCELED 04/04/2021  ° HDL 23 (L) 01/20/2015  ° °Lab Results  °Component Value Date  ° LDLCALC 79 04/04/2021  ° LDLCALC 54 01/20/2015  ° LDLCALC 59 01/29/2013  ° °Lab Results  °Component Value Date  ° TRIG 121 04/04/2021  ° TRIG CANCELED 04/04/2021  ° TRIG 90 01/20/2015  ° °Lab Results  °Component Value Date  ° CHOLHDL 4.0 04/04/2021  ° CHOLHDL 4.1 01/20/2015  ° CHOLHDL 3.1 01/29/2013  ° °Lab Results  °Component Value Date  ° HGBA1C 5.8 (A) 04/04/2021  ° HGBA1C 5.8 04/04/2021  ° HGBA1C 5.8 04/04/2021  ° HGBA1C 5.8 04/04/2021  ° ° °   °Assessment & Plan:  ° °Problem List Items Addressed This Visit   ° °  ° Other  ° Anemia, iron deficiency  ° Relevant Orders  °  Iron, TIBC and Ferritin Panel °Encouraged continued usage of iron supplements until labs resulted °Prescribed medication for possible constipation  ° °Other Visit Diagnoses   ° ° Left upper quadrant abdominal pain    -  Primary °Informed to take OTC medications as needed for pain °Maintain upcoming CT appointment  °Follow up in clinic if symptoms don't improve or worsen  ° Other constipation      ° Relevant Medications  ° polyethylene glycol powder (GLYCOLAX/MIRALAX) 17 GM/SCOOP powder °Discussed diet at length °Discussed non pharmacological methods for management  ° °Follow up in 6 mths for reevaluation of abdominal pain and wellness exam, sooner as needed  ° ° °I am having Anniston A. Hackenberg start on polyethylene glycol powder. I am also having her maintain her fluticasone, cetirizine, acetaminophen, promethazine-dextromethorphan, albuterol, ferrous sulfate, and multivitamin. ° °Meds ordered this encounter  °Medications  ° polyethylene glycol powder (GLYCOLAX/MIRALAX) 17 GM/SCOOP powder  °  Sig: Take 17 g by mouth daily.  °  Dispense:  3350 g  °  Refill:  1  ° ° ° °Tewana I Passmore, NP °  °

## 2021-05-08 NOTE — Patient Instructions (Signed)
You were seen today in the [facility] for follow up visit. You were prescribed medications, please take as directed. Please follow up in 6 mths for reevaluation and wellness exam.

## 2021-05-09 LAB — IRON,TIBC AND FERRITIN PANEL
Ferritin: 16 ng/mL (ref 15–150)
Iron Saturation: 13 % — ABNORMAL LOW (ref 15–55)
Iron: 49 ug/dL (ref 27–159)
Total Iron Binding Capacity: 374 ug/dL (ref 250–450)
UIBC: 325 ug/dL (ref 131–425)

## 2021-05-10 ENCOUNTER — Other Ambulatory Visit: Payer: Self-pay | Admitting: Nurse Practitioner

## 2021-08-05 ENCOUNTER — Encounter (HOSPITAL_COMMUNITY): Payer: Self-pay

## 2021-08-05 ENCOUNTER — Ambulatory Visit (HOSPITAL_COMMUNITY)
Admission: EM | Admit: 2021-08-05 | Discharge: 2021-08-05 | Disposition: A | Discharge status: Home / Self Care | Payer: PRIVATE HEALTH INSURANCE | Attending: Internal Medicine | Admitting: Internal Medicine

## 2021-08-05 ENCOUNTER — Ambulatory Visit (INDEPENDENT_AMBULATORY_CARE_PROVIDER_SITE_OTHER): Payer: PRIVATE HEALTH INSURANCE

## 2021-08-05 DIAGNOSIS — J45901 Unspecified asthma with (acute) exacerbation: Secondary | ICD-10-CM

## 2021-08-05 DIAGNOSIS — R059 Cough, unspecified: Secondary | ICD-10-CM

## 2021-08-05 DIAGNOSIS — R0602 Shortness of breath: Secondary | ICD-10-CM | POA: Diagnosis not present

## 2021-08-05 MED ORDER — IPRATROPIUM-ALBUTEROL 0.5-2.5 (3) MG/3ML IN SOLN
3.0000 mL | Freq: Once | RESPIRATORY_TRACT | Status: AC
  Administered 2021-08-05: 3 mL via RESPIRATORY_TRACT

## 2021-08-05 MED ORDER — IPRATROPIUM-ALBUTEROL 0.5-2.5 (3) MG/3ML IN SOLN
RESPIRATORY_TRACT | Status: AC
  Filled 2021-08-05: qty 3

## 2021-08-05 MED ORDER — METHYLPREDNISOLONE 4 MG PO TBPK: ORAL_TABLET | ORAL | 0 refills | Status: DC

## 2021-08-05 MED ORDER — PROMETHAZINE-DM 6.25-15 MG/5ML PO SYRP: 5.0000 mL | ORAL_SOLUTION | Freq: Two times a day (BID) | ORAL | 0 refills | Status: DC | PRN

## 2021-08-05 MED ORDER — ALBUTEROL SULFATE HFA 108 (90 BASE) MCG/ACT IN AERS: 1.0000 | INHALATION_SPRAY | Freq: Four times a day (QID) | RESPIRATORY_TRACT | 0 refills | Status: DC | PRN

## 2021-08-05 NOTE — ED Provider Notes (Signed)
[facility]    CSN: [id number] Arrival date & time: 08/05/21  1052      History   Chief Complaint Chief Complaint  Patient presents with   Cough    HPI Brenda Bell is a 51 y.o. female who presents with onset of her allergies acting up 3 days ago, worse than usual and she took a Zyrtec yesterday. Has been having cough attacks. Had been coughing green mucous 2 days ago  but has been clear since.     Past Medical History:  Diagnosis Date   Anemia    B12 deficiency    Gastritis    Vitamin D deficiency     Patient Active Problem List   Diagnosis Date Noted   Obesity with body mass index 30 or greater 04/04/2021   Anemia 04/04/2021   Menorrhagia 04/04/2021   Neck pain 04/04/2021   Vaginal irritation 04/04/2021   Vitamin D deficiency 04/04/2021   Pain in pelvis 12/02/2020   Abnormal laboratory test result 11/22/2020   Raised TSH level 11/17/2020   Contraceptive management 10/27/2019   Bacterial vaginosis 10/27/2019   Anemia, iron deficiency 08/20/2013   ALLERGIC RHINITIS 01/17/2007   Asthma, mild intermittent 12/30/2006   GERD 12/30/2006   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/06/2005    Past Surgical History:  Procedure Laterality Date   BREAST SURGERY Left    cyst removed   CESAREAN SECTION     CHOLECYSTECTOMY     KNEE SURGERY Right    tendon repair   LAPAROSCOPIC APPENDECTOMY N/A 10/26/2016   Procedure: APPENDECTOMY LAPAROSCOPIC;  Surgeon: Berna Bue, MD;  Location: [facility] OR;  Service: General;  Laterality: N/A;   OVARY SURGERY Right    benign tumor of right ovary removed, 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 Authorizing Provider  methylPREDNISolone (MEDROL  DOSEPAK) 4 MG TBPK tablet Take as directed 08/05/21  Yes Rodriguez-Southworth, Nettie Elm, PA-C  albuterol (VENTOLIN HFA) 108 (90 Base) MCG/ACT inhaler Inhale 1-2 puffs into the lungs every 6 (six) hours as needed for wheezing or shortness of breath. 08/05/21   Rodriguez-Southworth, Nettie Elm, PA-C  cetirizine (ZYRTEC) 10 MG tablet Take 1 tablet (10 mg total) by mouth daily. 09/23/19   Arvilla Market, MD  ferrous sulfate 325 (65 FE) MG tablet Take 1 tablet by mouth daily.    [provider]  Multiple Vitamin (MULTIVITAMIN) tablet Take 1 tablet by mouth daily.    [provider]  polyethylene glycol powder (GLYCOLAX/MIRALAX) 17 GM/SCOOP powder Take 17 g by mouth daily. 05/08/21   Orion Crook I, NP  promethazine-dextromethorphan (PROMETHAZINE-DM) 6.25-15 MG/5ML syrup Take 5 mLs by mouth 2 (two) times daily as needed for cough. 08/05/21   Rodriguez-Southworth, Nettie Elm, PA-C    Family History Family History  Problem Relation Age of Onset   Hypertension Mother    Diabetes Father     Social History Social  History   Tobacco Use   Smoking status: Never   Smokeless tobacco: Never  Vaping Use   Vaping Use: Never used  Substance Use Topics   Alcohol use: No    Alcohol/week: 0.0 standard drinks   Drug use: No     Allergies   Hydrocodone-acetaminophen   Review of Systems Review of Systems  Constitutional:  Positive for chills. Negative for appetite change and fever.  HENT:  Positive for congestion, postnasal drip, rhinorrhea and sneezing. Negative for ear discharge and ear pain.   Eyes:  Negative for discharge.  Respiratory:  Positive for cough, shortness of breath and wheezing. Negative for chest tightness.   Cardiovascular:  Negative for chest pain.  Musculoskeletal:  Negative for myalgias.  Neurological:  Negative for headaches.    Physical Exam Triage Vital Signs ED Triage Vitals [08/05/21 1231]  Enc Vitals Group     BP 128/79     Pulse Rate 91     Resp 18      Temp 99.1 F (37.3 C)     Temp Source Oral     SpO2 96 %     Weight      Height      Head Circumference      Peak Flow      Pain Score 0     Pain Loc      Pain Edu?      Excl. in GC?    No data found.  Updated Vital Signs BP 128/79 (BP Location: Right Arm)   Pulse 91   Temp 99.1 F (37.3 C) (Oral)   Resp 18   LMP 11/13/2019   SpO2 96%   Visual Acuity Right Eye Distance:   Left Eye Distance:   Bilateral Distance:    Right Eye Near:   Left Eye Near:    Bilateral Near:     Physical Exam Physical Exam Constitutional:      General: He is not in acute distress.    Appearance: He is not toxic-appearing.  HENT:     Head: Normocephalic.     Right Ear: Tympanic membrane, ear canal and external ear normal.     Left Ear: Ear canal and external ear normal.     Nose: pink pale mucosa with clear mucous      Mouth/Throat: clear    Mouth: Mucous membranes are moist.     Pharynx: Oropharynx is clear.  Eyes:     General: No scleral icterus.    Conjunctiva/sclera: Conjunctivae normal.  Cardiovascular:     Rate and Rhythm: Normal rate and regular rhythm.     Heart sounds: No murmur heard.   Pulmonary:     Effort: Pulmonary effort is difficult since it provoked cough attacks. This improved after the duo neb treatment.      Breath sounds: clear.    Musculoskeletal:        General: Normal range of motion.     Cervical back: Neck supple.  Lymphadenopathy:     Cervical: No cervical adenopathy.  Skin:    General: Skin is warm and dry.     Findings: No rash.  Neurological:     Mental Status: He is alert and oriented to person, place, and time.     Gait: Gait normal.  Psychiatric:        Mood and Affect: Mood normal.        Behavior: Behavior normal.        Thought Content: Thought content normal.  Judgment: Judgment normal.     UC Treatments / Results  Labs (all labs ordered are listed, but only abnormal results are displayed) Labs Reviewed - No data to  display  EKG   Radiology DG Chest 2 View  Result Date: 08/05/2021 CLINICAL DATA:  Cough and shortness of breath. EXAM: CHEST - 2 VIEW COMPARISON:  Chest radiograph 11/04/2016 FINDINGS: The cardiomediastinal contours are within normal limits. The lungs are clear. No pneumothorax or pleural effusion. No acute finding in the visualized skeleton. IMPRESSION: No active cardiopulmonary disease. Electronically Signed   By: Emmaline Kluver M.D.   On: 08/05/2021 12:55    Procedures Procedures (including critical care time)  Medications Ordered in UC Medications  ipratropium-albuterol (DUONEB) 0.5-2.5 (3) MG/3ML nebulizer solution 3 mL (3 mLs Nebulization Given 08/05/21 1322)    Initial Impression / Assessment and Plan / UC Course  I have reviewed the triage vital signs and the nursing notes.  Pertinent  imaging results that were available during my care of the patient were reviewed by me and considered in my medical decision making (see chart for details). She was given Duoneb here and helped her cough attacks  Allergic bronchitis  I placed her on Medrol, and phenergan VC and refilled her Albuterol as noted.  Since she gets this way, I advised her to discuss with her PCP about being placed on Singular a few months before she gets this way to prevent her asthma attacks.    Final Clinical Impressions(s) / UC Diagnoses   Final diagnoses:  Exacerbation of asthma, unspecified asthma severity, unspecified whether persistent     Discharge Instructions      Preguntele a su doctor que le recete Singulair el proximo ano anters de que te ataque tus alergias.  Use su inhelador cada 4 horas por 5 dias    ED Prescriptions     Medication Sig Dispense Auth. Provider   methylPREDNISolone (MEDROL DOSEPAK) 4 MG TBPK tablet Take as directed 21 tablet Rodriguez-Southworth, Nettie Elm, PA-C   promethazine-dextromethorphan (PROMETHAZINE-DM) 6.25-15 MG/5ML syrup Take 5 mLs by mouth 2 (two) times daily as  needed for cough. 118 mL Rodriguez-Southworth, Manfred Laspina, PA-C   albuterol (VENTOLIN HFA) 108 (90 Base) MCG/ACT inhaler Inhale 1-2 puffs into the lungs every 6 (six) hours as needed for wheezing or shortness of breath. 18 g Rodriguez-Southworth, Nettie Elm, PA-C      PDMP not reviewed this encounter.   Garey Ham, PA-C 08/05/21 1329

## 2021-08-05 NOTE — Discharge Instructions (Addendum)
Preguntele a su doctor que le recete Singulair el proximo ano anters de que te ataque tus alergias.  ?Use su inhelador cada 4 horas por 5 dias ?

## 2021-08-05 NOTE — ED Triage Notes (Signed)
3 day h/o cough and onset yesterday of fever and congestion. Has been taking nyquil. No v/d. Coughing spells are interfering w/her sleep. ?

## 2021-11-06 ENCOUNTER — Ambulatory Visit: Payer: BLUE CROSS/BLUE SHIELD | Admitting: Nurse Practitioner

## 2021-11-06 ENCOUNTER — Ambulatory Visit (INDEPENDENT_AMBULATORY_CARE_PROVIDER_SITE_OTHER): Payer: PRIVATE HEALTH INSURANCE | Admitting: Nurse Practitioner

## 2021-11-06 ENCOUNTER — Encounter: Payer: Self-pay | Admitting: Nurse Practitioner

## 2021-11-06 VITALS — BP 118/84 | HR 69 | Temp 98.1°F | Ht 65.45 in | Wt 195.0 lb

## 2021-11-06 DIAGNOSIS — E538 Deficiency of other specified B group vitamins: Secondary | ICD-10-CM | POA: Diagnosis not present

## 2021-11-06 DIAGNOSIS — E559 Vitamin D deficiency, unspecified: Secondary | ICD-10-CM | POA: Diagnosis not present

## 2021-11-06 DIAGNOSIS — Z0000 Encounter for general adult medical examination without abnormal findings: Secondary | ICD-10-CM | POA: Diagnosis not present

## 2021-11-06 DIAGNOSIS — J302 Other seasonal allergic rhinitis: Secondary | ICD-10-CM

## 2021-11-06 DIAGNOSIS — M2550 Pain in unspecified joint: Secondary | ICD-10-CM

## 2021-11-06 LAB — POCT URINALYSIS DIP (CLINITEK)
Bilirubin, UA: NEGATIVE
Blood, UA: NEGATIVE
Glucose, UA: NEGATIVE mg/dL
Ketones, POC UA: NEGATIVE mg/dL
Leukocytes, UA: NEGATIVE
Nitrite, UA: NEGATIVE
POC PROTEIN,UA: NEGATIVE
Spec Grav, UA: 1.025 (ref 1.010–1.025)
Urobilinogen, UA: 0.2 E.U./dL
pH, UA: 6 (ref 5.0–8.0)

## 2021-11-06 MED ORDER — PREDNISONE 20 MG PO TABS: 20.0000 mg | ORAL_TABLET | Freq: Every day | ORAL | 0 refills | Status: AC

## 2021-11-06 MED ORDER — CETIRIZINE HCL 10 MG PO TABS: 10.0000 mg | ORAL_TABLET | Freq: Every day | ORAL | 11 refills | Status: DC

## 2021-11-06 NOTE — Progress Notes (Signed)
@Patient  ID: , female    DOB: 1971-01-15, 51 y.o.   MRN: [id number]  Chief Complaint  Patient presents with   Follow-up    Pt is here for 6 months's follow up. Pt stated she would like her sugar level checked pt states her joints have been hurting pt has been feeling like this for 1 month. Pt is requesting a refill on cetirizine    Referring provider: No ref. provider found   HPI  [id number] 51 y.o. female  has a past medical history of Anemia, B12 deficiency, Gastritis, and Vitamin D deficiency.   Patient presents today for 47-month follow-up.  Patient complains today that she has been having joint pain to her feet and ankles and hands and wrists.  She states that at times she does have associated swelling.  She states that she is not having this pain or swelling today.  She states that this has been intermittent for the past month. Denies f/c/s, n/v/d, hemoptysis, PND, leg swelling Denies chest pain or edema       Allergies  Allergen Reactions   Hydrocodone-Acetaminophen Nausea And Vomiting    Decreased b/p    Immunization History  Administered Date(s) Administered   Influenza Split 01/29/2013   Influenza Whole 01/17/2007   Influenza, Quadrivalent, Recombinant, Inj, Pf 02/02/2019   Influenza,inj,Quad PF,6+ Mos 01/20/2015, 02/09/2016, 02/25/2017, 01/23/2018   Influenza-Unspecified 02/14/2021   Pneumococcal Polysaccharide-23 01/17/2007   Td 06/01/2006, 06/18/2006   Tdap 12/25/2012    Past Medical History:  Diagnosis Date   Anemia    B12 deficiency    Gastritis    Vitamin D deficiency     Tobacco History: Social History   Tobacco Use  Smoking Status Never  Smokeless Tobacco Never   Counseling given: Not Answered   Outpatient Encounter Medications as of 11/06/2021  Medication Sig   albuterol (VENTOLIN HFA) 108 (90 Base) MCG/ACT inhaler Inhale 1-2 puffs into the lungs every 6 (six) hours as needed for wheezing or shortness of breath.    ferrous sulfate 325 (65 FE) MG tablet Take 1 tablet by mouth daily.   Multiple Vitamin (MULTIVITAMIN) tablet Take 1 tablet by mouth daily.   predniSONE (DELTASONE) 20 MG tablet Take 1 tablet (20 mg total) by mouth daily with breakfast for 5 days.   promethazine-dextromethorphan (PROMETHAZINE-DM) 6.25-15 MG/5ML syrup Take 5 mLs by mouth 2 (two) times daily as needed for cough.   [DISCONTINUED] cetirizine (ZYRTEC) 10 MG tablet Take 1 tablet (10 mg total) by mouth daily.   cetirizine (ZYRTEC) 10 MG tablet Take 1 tablet (10 mg total) by mouth daily.   methylPREDNISolone (MEDROL DOSEPAK) 4 MG TBPK tablet Take as directed (Patient not taking: Reported on 11/06/2021)   polyethylene glycol powder (GLYCOLAX/MIRALAX) 17 GM/SCOOP powder Take 17 g by mouth daily. (Patient not taking: Reported on 11/06/2021)   No facility-administered encounter medications on file as of 11/06/2021.     Review of Systems  Review of Systems  Constitutional: Negative.   HENT: Negative.    Cardiovascular: Negative.   Gastrointestinal: Negative.   Musculoskeletal:  Positive for arthralgias and myalgias.  Allergic/Immunologic: Negative.   Neurological: Negative.   Psychiatric/Behavioral: Negative.         Physical Exam  BP 118/84 (BP Location: Left Arm, Patient Position: Sitting, Cuff Size: Large)   Pulse 69   Temp 98.1 F (36.7 C)   Ht 5' 5.45" (1.662 m)   Wt 195 lb (88.5 kg)   LMP 11/13/2019  SpO2 100%   BMI 32.01 kg/m   Wt Readings from Last 5 Encounters:  11/06/21 195 lb (88.5 kg)  05/08/21 193 lb (87.5 kg)  04/04/21 194 lb (88 kg)  10/27/19 191 lb (86.6 kg)  06/02/19 182 lb (82.6 kg)     Physical Exam Vitals and nursing note reviewed.  Constitutional:      General: She is not in acute distress.    Appearance: She is well-developed.  Cardiovascular:     Rate and Rhythm: Normal rate and regular rhythm.  Pulmonary:     Effort: Pulmonary effort is normal.     Breath sounds: Normal breath sounds.   Neurological:     Mental Status: She is alert and oriented to person, place, and time.      Lab Results:  CBC    Component Value Date/Time   WBC 4.0 04/04/2021 0954   WBC 4.2 02/26/2017 1252   RBC 4.57 04/04/2021 0954   RBC 3.87 02/26/2017 1252   HGB 12.8 04/04/2021 0954   HCT 40.0 04/04/2021 0954   PLT 227 04/04/2021 0954   MCV 88 04/04/2021 0954   MCH 28.0 04/04/2021 0954   MCH 21.4 (L) 02/26/2017 1252   MCHC 32.0 04/04/2021 0954   MCHC 29.3 (L) 02/26/2017 1252   RDW 16.9 (H) 04/04/2021 0954   LYMPHSABS 1.7 04/04/2021 0954   MONOABS 0.5 02/26/2017 1252   EOSABS 0.1 04/04/2021 0954   BASOSABS 0.0 04/04/2021 0954    BMET    Component Value Date/Time   NA 140 04/04/2021 0954   K 4.1 04/04/2021 0954   CL 105 04/04/2021 0954   CO2 25 04/04/2021 0954   GLUCOSE 103 (H) 04/04/2021 0954   GLUCOSE 91 02/26/2017 1252   BUN 13 04/04/2021 0954   CREATININE 0.59 04/04/2021 0954   CREATININE 0.44 (L) 01/20/2015 1358   CALCIUM 8.8 04/04/2021 0954   GFRNONAA 117 03/04/2019 1102   GFRAA 135 03/04/2019 1102    BNP No results found for: "BNP"  ProBNP No results found for: "PROBNP"  Imaging: No results found.   Assessment & Plan:   Healthcare maintenance - CBC - Comprehensive metabolic panel - POCT URINALYSIS DIP (CLINITEK) - NuSwab Vaginitis Plus (VG+)  2. Vitamin D deficiency  - Vitamin D, 25-hydroxy  3. Vitamin B12 deficiency  - Vitamin B12  4. Seasonal allergies  - cetirizine (ZYRTEC) 10 MG tablet; Take 1 tablet (10 mg total) by mouth daily.  Dispense: 30 tablet; Refill: 11  5. Arthralgia, unspecified joint  - predniSONE (DELTASONE) 20 MG tablet; Take 1 tablet (20 mg total) by mouth daily with breakfast for 5 days.  Dispense: 5 tablet; Refill: 0   Follow up:  Follow up in 6 months or sooner if needed     Ivonne Andrew, NP 11/06/2021

## 2021-11-06 NOTE — Assessment & Plan Note (Signed)
-   CBC - Comprehensive metabolic panel - POCT URINALYSIS DIP (CLINITEK) - NuSwab Vaginitis Plus (VG+)  2. Vitamin D deficiency  - Vitamin D, 25-hydroxy  3. Vitamin B12 deficiency  - Vitamin B12  4. Seasonal allergies  - cetirizine (ZYRTEC) 10 MG tablet; Take 1 tablet (10 mg total) by mouth daily.  Dispense: 30 tablet; Refill: 11  5. Arthralgia, unspecified joint  - predniSONE (DELTASONE) 20 MG tablet; Take 1 tablet (20 mg total) by mouth daily with breakfast for 5 days.  Dispense: 5 tablet; Refill: 0   Follow up:  Follow up in 6 months or sooner if needed

## 2021-11-06 NOTE — Patient Instructions (Addendum)
1. Healthcare maintenance  - CBC - Comprehensive metabolic panel - POCT URINALYSIS DIP (CLINITEK) - NuSwab Vaginitis Plus (VG+)  2. Vitamin D deficiency  - Vitamin D, 25-hydroxy  3. Vitamin B12 deficiency  - Vitamin B12  4. Seasonal allergies  - cetirizine (ZYRTEC) 10 MG tablet; Take 1 tablet (10 mg total) by mouth daily.  Dispense: 30 tablet; Refill: 11  5. Arthralgia, unspecified joint  - predniSONE (DELTASONE) 20 MG tablet; Take 1 tablet (20 mg total) by mouth daily with breakfast for 5 days.  Dispense: 5 tablet; Refill: 0   Follow up:  Follow up in 6 months or sooner if needed

## 2021-11-07 LAB — COMPREHENSIVE METABOLIC PANEL
ALT: 27 IU/L (ref 0–32)
AST: 26 IU/L (ref 0–40)
Albumin/Globulin Ratio: 1.4 (ref 1.2–2.2)
Albumin: 4 g/dL (ref 3.9–4.9)
Alkaline Phosphatase: 60 IU/L (ref 44–121)
BUN/Creatinine Ratio: 19 (ref 9–23)
BUN: 9 mg/dL (ref 6–24)
Bilirubin Total: 0.5 mg/dL (ref 0.0–1.2)
CO2: 21 mmol/L (ref 20–29)
Calcium: 9 mg/dL (ref 8.7–10.2)
Chloride: 107 mmol/L — ABNORMAL HIGH (ref 96–106)
Creatinine, Ser: 0.47 mg/dL — ABNORMAL LOW (ref 0.57–1.00)
Globulin, Total: 2.9 g/dL (ref 1.5–4.5)
Glucose: 95 mg/dL (ref 70–99)
Potassium: 4.3 mmol/L (ref 3.5–5.2)
Sodium: 142 mmol/L (ref 134–144)
Total Protein: 6.9 g/dL (ref 6.0–8.5)
eGFR: 117 mL/min/{1.73_m2} (ref >59)

## 2021-11-07 LAB — CBC
Hematocrit: 42.7 % (ref 34.0–46.6)
Hemoglobin: 14.2 g/dL (ref 11.1–15.9)
MCH: 30.5 pg (ref 26.6–33.0)
MCHC: 33.3 g/dL (ref 31.5–35.7)
MCV: 92 fL (ref 79–97)
Platelets: 192 10*3/uL (ref 150–450)
RBC: 4.65 x10E6/uL (ref 3.77–5.28)
RDW: 13.3 % (ref 11.7–15.4)
WBC: 3.5 10*3/uL (ref 3.4–10.8)

## 2021-11-07 LAB — VITAMIN D 25 HYDROXY (VIT D DEFICIENCY, FRACTURES): Vit D, 25-Hydroxy: 26.5 ng/mL — ABNORMAL LOW (ref 30.0–100.0)

## 2021-11-07 LAB — VITAMIN B12: Vitamin B-12: >2000 pg/mL — ABNORMAL HIGH (ref 232–1245)

## 2021-11-09 LAB — NUSWAB VAGINITIS PLUS (VG+)
Candida albicans, NAA: NEGATIVE
Candida glabrata, NAA: NEGATIVE
Chlamydia trachomatis, NAA: NEGATIVE
Neisseria gonorrhoeae, NAA: NEGATIVE
Trich vag by NAA: NEGATIVE

## 2022-02-05 ENCOUNTER — Encounter (HOSPITAL_COMMUNITY): Payer: Self-pay | Admitting: Emergency Medicine

## 2022-02-05 ENCOUNTER — Ambulatory Visit (HOSPITAL_COMMUNITY)
Admission: EM | Admit: 2022-02-05 | Discharge: 2022-02-05 | Disposition: A | Discharge status: Home / Self Care | Payer: PRIVATE HEALTH INSURANCE

## 2022-02-05 DIAGNOSIS — H1131 Conjunctival hemorrhage, right eye: Secondary | ICD-10-CM | POA: Diagnosis not present

## 2022-02-05 DIAGNOSIS — R7303 Prediabetes: Secondary | ICD-10-CM

## 2022-02-05 NOTE — Discharge Instructions (Signed)
Recommend you follow up with primary care physician for repeat A1C and evaluation of blood pressure.  Call to make an appointment Your eye will improve with time. If you develop difficulty with vision or experience pain return for evaluation.

## 2022-02-05 NOTE — ED Triage Notes (Signed)
Pt reports waking up yesterday morning with her right eye red. States it feels like pressure and something may be inside her eye.   Also reports being pre-diabetic and feeling bad the past 2 weeks. States her mother took her sugar one morning and it was 175.States she was feeling bad at work and her coworker gave her some metformin tablets. Reports feeling better after taking Metformin. Does not have a PCP.

## 2022-02-05 NOTE — ED Provider Notes (Signed)
[place]    CSN: [id number] Arrival date & time: 02/05/22  1856      History   Chief Complaint Chief Complaint  Patient presents with   Eye Problem    HPI Brenda Bell is a 51 y.o. female.   Pt complains of bloodshot right eye that she noticed yesterday morning.  Denies trauma or injury.  She denies pain, visual changes, or discharge.  She does not wear glasses or contacts.    She reports she has not been feeling well over the last few weeks.  Her mother is a diabetic and checked a cbg one day with reading of 175.  This was non fasting.  She reports one day in the last week she was experiencing nausea and vomiting while at work.  She reports taking a friends metformin and states that this made her feel better.  Denies headaches, chest pain, shortness of breath, lower extremity edema, palpitations.  She was last seen by her PCP in August of this year.  A1C from January of this year 5.8. Pt denies abdominal pain, n/v/d today.   BP Readings from Last 3 Encounters: 02/05/22 : (!) 142/105 11/06/21 : 118/84 08/05/21 : 128/79      Past Medical History:  Diagnosis Date   Anemia    B12 deficiency    Gastritis    Vitamin D deficiency     Patient Active Problem List   Diagnosis Date Noted   Healthcare maintenance 11/06/2021   Obesity with body mass index 30 or greater 04/04/2021   Anemia 04/04/2021   Menorrhagia 04/04/2021   Neck pain 04/04/2021   Vaginal irritation 04/04/2021   Vitamin D deficiency 04/04/2021   Pain in pelvis 12/02/2020   Abnormal laboratory test result 11/22/2020   Raised TSH level 11/17/2020   Contraceptive management 10/27/2019   Bacterial vaginosis 10/27/2019   Anemia, iron deficiency 08/20/2013   ALLERGIC RHINITIS 01/17/2007   Asthma, mild intermittent 12/30/2006   GERD 12/30/2006   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/06/2005    Past Surgical History:  Procedure Laterality Date   BREAST SURGERY Left    cyst removed   CESAREAN SECTION      CHOLECYSTECTOMY     KNEE SURGERY Right    tendon repair   LAPAROSCOPIC APPENDECTOMY N/A 10/26/2016   Procedure: APPENDECTOMY LAPAROSCOPIC;  Surgeon: Clovis Riley, MD;  Location: [facility] OR;  Service: General;  Laterality: N/A;   OVARY SURGERY Right    benign tumor of right ovary removed, 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 Authorizing Provider  albuterol (VENTOLIN HFA) 108 (90 Base) MCG/ACT inhaler Inhale 1-2 puffs into the lungs every 6 (six) hours as needed for wheezing or shortness of breath. 08/05/21   Rodriguez-Southworth, Sunday Spillers, PA-C  cetirizine (ZYRTEC) 10 MG tablet Take 1 tablet (10 mg total) by mouth daily. 11/06/21   Fenton Foy, NP  ferrous sulfate 325 (65 FE) MG tablet Take 1 tablet by mouth daily.    [provider]  methylPREDNISolone (MEDROL DOSEPAK) 4 MG TBPK tablet Take as directed Patient not taking: Reported on 11/06/2021 08/05/21   Rodriguez-Southworth, Sunday Spillers, PA-C  Multiple Vitamin (MULTIVITAMIN) tablet Take 1 tablet by mouth daily.    [provider]  polyethylene glycol powder (GLYCOLAX/MIRALAX) 17 GM/SCOOP powder Take 17 g by mouth daily. Patient not taking: Reported on 11/06/2021 05/08/21   Bo Merino I, NP  promethazine-dextromethorphan (PROMETHAZINE-DM) 6.25-15 MG/5ML syrup Take 5 mLs by mouth 2 (two) times daily as needed for cough. 08/05/21   Rodriguez-Southworth, Sunday Spillers, PA-C    Family History Family History  Problem Relation Age of Onset   Hypertension Mother    Diabetes Father     Social History Social History   Tobacco Use   Smoking status: Never   Smokeless tobacco: Never  Vaping Use   Vaping Use: Never used  Substance Use  Topics   Alcohol use: No    Alcohol/week: 0.0 standard drinks of alcohol   Drug use: No     Allergies   Hydrocodone-acetaminophen   Review of Systems Review of Systems  Constitutional:  Negative for chills and fever.  HENT:  Negative for ear pain and sore throat.   Eyes:  Positive for redness. Negative for pain and visual disturbance.  Respiratory:  Negative for cough and shortness of breath.   Cardiovascular:  Negative for chest pain and palpitations.  Gastrointestinal:  Negative for abdominal pain and vomiting.  Genitourinary:  Negative for dysuria and hematuria.  Musculoskeletal:  Negative for arthralgias and back pain.  Skin:  Negative for color change and rash.  Neurological:  Negative for seizures and syncope.  All other systems reviewed and are negative.    Physical Exam Triage Vital Signs ED Triage Vitals [02/05/22 1931]  Enc Vitals Group     BP (!) 142/105     Pulse Rate 72     Resp 18     Temp 97.9 F (36.6 C)     Temp Source Oral     SpO2 98 %     Weight      Height      Head Circumference      Peak Flow      Pain Score 4     Pain Loc      Pain Edu?      Excl. in [place]?    No data found.  Updated Vital Signs BP (!) 142/105 (BP Location: Left Arm)   Pulse 72   Temp 97.9 F (36.6 C) (Oral)   Resp 18   LMP 11/13/2019   SpO2 98%   Visual Acuity Right Eye Distance:   Left Eye Distance:   Bilateral Distance:    Right Eye Near:   Left Eye Near:    Bilateral Near:     Physical Exam Vitals and nursing note reviewed.  Constitutional:      General: She is not in acute distress.    Appearance: She is well-developed.  HENT:     Head: Normocephalic and atraumatic.  Eyes:     Conjunctiva/sclera:     Right eye: Hemorrhage present.  Cardiovascular:     Rate and Rhythm: Normal rate and regular rhythm.     Heart sounds: No murmur heard. Pulmonary:     Effort: Pulmonary effort is normal. No respiratory distress.     Breath sounds: Normal breath  sounds.  Abdominal:  Palpations: Abdomen is soft.     Tenderness: There is no abdominal tenderness.  Musculoskeletal:        General: No swelling.     Cervical back: Neck supple.  Skin:    General: Skin is warm and dry.     Capillary Refill: Capillary refill takes less than 2 seconds.  Neurological:     Mental Status: She is alert.  Psychiatric:        Mood and Affect: Mood normal.      UC Treatments / Results  Labs (all labs ordered are listed, but only abnormal results are displayed) Labs Reviewed - No data to display  EKG   Radiology No results found.  Procedures Procedures (including critical care time)  Medications Ordered in UC Medications - No data to display  Initial Impression / Assessment and Plan / UC Course  I have reviewed the triage vital signs and the nursing notes.  Pertinent labs & imaging results that were available during my care of the patient were reviewed by me and considered in my medical decision making (see chart for details).     Right subconjunctival hemorrhage, denies trauma, pain or visual changes.  This should improve with time.  ED precautions given.   Advised follow up with PCP for further evaluation of prediabetes, recheck A1C and elevated blood pressure reading today.  Diet changes discussed until she can get in to see PCP.  Advised to monitor blood pressure at home.  Will not start medication today with isolated elevated reading.  ED precautions given.    Pt overall well appearing today in no acute distress.  Stable for discharge.  Final Clinical Impressions(s) / UC Diagnoses   Final diagnoses:  Subconjunctival hemorrhage of right eye  Prediabetes     Discharge Instructions      Recommend you follow up with primary care physician for repeat A1C and evaluation of blood pressure.  Call to make an appointment Your eye will improve with time. If you develop difficulty with vision or experience pain return for evaluation.        ED Prescriptions   None    PDMP not reviewed this encounter.   Ward, Lenise Arena, PA-C 02/05/22 [phone]

## 2022-05-07 ENCOUNTER — Ambulatory Visit: Payer: PRIVATE HEALTH INSURANCE | Admitting: Nurse Practitioner

## 2022-05-09 ENCOUNTER — Encounter: Payer: Self-pay | Admitting: Nurse Practitioner

## 2022-05-09 ENCOUNTER — Ambulatory Visit (INDEPENDENT_AMBULATORY_CARE_PROVIDER_SITE_OTHER): Payer: 59 | Admitting: Nurse Practitioner

## 2022-05-09 VITALS — BP 130/76 | HR 82 | Temp 98.0°F | Wt 205.4 lb

## 2022-05-09 DIAGNOSIS — N898 Other specified noninflammatory disorders of vagina: Secondary | ICD-10-CM | POA: Diagnosis not present

## 2022-05-09 DIAGNOSIS — K219 Gastro-esophageal reflux disease without esophagitis: Secondary | ICD-10-CM

## 2022-05-09 DIAGNOSIS — R1030 Lower abdominal pain, unspecified: Secondary | ICD-10-CM | POA: Diagnosis not present

## 2022-05-09 DIAGNOSIS — Z131 Encounter for screening for diabetes mellitus: Secondary | ICD-10-CM | POA: Diagnosis not present

## 2022-05-09 DIAGNOSIS — Z23 Encounter for immunization: Secondary | ICD-10-CM | POA: Diagnosis not present

## 2022-05-09 LAB — POCT URINALYSIS DIP (CLINITEK)
Bilirubin, UA: NEGATIVE
Blood, UA: NEGATIVE
Glucose, UA: NEGATIVE mg/dL
Ketones, POC UA: NEGATIVE mg/dL
Leukocytes, UA: NEGATIVE
Nitrite, UA: NEGATIVE
POC PROTEIN,UA: NEGATIVE
Spec Grav, UA: 1.025 (ref 1.010–1.025)
Urobilinogen, UA: 0.2 E.U./dL
pH, UA: 7 (ref 5.0–8.0)

## 2022-05-09 LAB — POCT GLYCOSYLATED HEMOGLOBIN (HGB A1C): Hemoglobin A1C: 5.7 % — AB (ref 4.0–5.6)

## 2022-05-09 MED ORDER — OMEPRAZOLE 20 MG PO CPDR: 20.0000 mg | DELAYED_RELEASE_CAPSULE | Freq: Every day | ORAL | 3 refills | Status: DC

## 2022-05-09 NOTE — Progress Notes (Signed)
$@PatientJ$  ID: Brenda Bell, female    DOB: 04-01-1971, 52 y.o.   MRN: [id number]  Chief Complaint  Patient presents with   Vaginal Discharge    Referring provider: Fenton Foy, NP   HPI  Brenda Bell 52 y.o. female  has a past medical history of Anemia, B12 deficiency, Gastritis, and Vitamin D deficiency.   Patient presents today for acute visit.  Patient states that she does have chronic lower abdominal pain.  She has recently been having vaginal discharge.  We will check swab in office today.  We will refer patient for an ultrasound of her abdomen.  Patient denies any diarrhea or constipation.  She also denies any blood in stools.  Denies f/c/s, n/v/d, hemoptysis, PND, leg swelling Denies chest pain or edema        Allergies  Allergen Reactions   Hydrocodone-Acetaminophen Nausea And Vomiting    Decreased b/p    Immunization History  Administered Date(s) Administered   Influenza Split 01/29/2013   Influenza Whole 01/17/2007   Influenza, Quadrivalent, Recombinant, Inj, Pf 02/02/2019   Influenza,inj,Quad PF,6+ Mos 01/20/2015, 02/09/2016, 02/25/2017, 01/23/2018, 05/09/2022   Influenza-Unspecified 02/14/2021   Pneumococcal Polysaccharide-23 01/17/2007   Td 06/01/2006, 06/18/2006   Tdap 12/25/2012    Past Medical History:  Diagnosis Date   Anemia    B12 deficiency    Gastritis    Vitamin D deficiency     Tobacco History: Social History   Tobacco Use  Smoking Status Never  Smokeless Tobacco Never   Counseling given: Not Answered   Outpatient Encounter Medications as of 05/09/2022  Medication Sig   albuterol (VENTOLIN HFA) 108 (90 Base) MCG/ACT inhaler Inhale 1-2 puffs into the lungs every 6 (six) hours as needed for wheezing or shortness of breath.   cetirizine (ZYRTEC) 10 MG tablet Take 1 tablet (10 mg total) by mouth daily.   omeprazole (PRILOSEC) 20 MG capsule Take 1 capsule (20 mg total) by mouth daily.   ferrous sulfate 325 (65 FE) MG tablet  Take 1 tablet by mouth daily. (Patient not taking: Reported on 05/09/2022)   methylPREDNISolone (MEDROL DOSEPAK) 4 MG TBPK tablet Take as directed (Patient not taking: Reported on 11/06/2021)   Multiple Vitamin (MULTIVITAMIN) tablet Take 1 tablet by mouth daily. (Patient not taking: Reported on 05/09/2022)   polyethylene glycol powder (GLYCOLAX/MIRALAX) 17 GM/SCOOP powder Take 17 g by mouth daily. (Patient not taking: Reported on 11/06/2021)   promethazine-dextromethorphan (PROMETHAZINE-DM) 6.25-15 MG/5ML syrup Take 5 mLs by mouth 2 (two) times daily as needed for cough. (Patient not taking: Reported on 05/09/2022)   No facility-administered encounter medications on file as of 05/09/2022.     Review of Systems  Review of Systems  Constitutional: Negative.   HENT: Negative.    Cardiovascular: Negative.   Gastrointestinal:  Positive for abdominal pain.  Genitourinary:  Positive for vaginal discharge.  Allergic/Immunologic: Negative.   Neurological: Negative.   Psychiatric/Behavioral: Negative.         Physical Exam  BP 130/76   Pulse 82   Temp 98 F (36.7 C)   Wt 205 lb 6.4 oz (93.2 kg)   LMP 11/13/2019   SpO2 98%   BMI 33.71 kg/m   Wt Readings from Last 5 Encounters:  05/09/22 205 lb 6.4 oz (93.2 kg)  11/06/21 195 lb (88.5 kg)  05/08/21 193 lb (87.5 kg)  04/04/21 194 lb (88 kg)  10/27/19 191 lb (86.6 kg)     Physical Exam Vitals and nursing note reviewed.  Constitutional:      General: She is not in acute distress.    Appearance: She is well-developed.  Cardiovascular:     Rate and Rhythm: Normal rate and regular rhythm.  Pulmonary:     Effort: Pulmonary effort is normal.     Breath sounds: Normal breath sounds.  Neurological:     Mental Status: She is alert and oriented to person, place, and time.      Lab Results:  CBC    Component Value Date/Time   WBC 3.5 11/06/2021 0931   WBC 4.2 02/26/2017 1252   RBC 4.65 11/06/2021 0931   RBC 3.87 02/26/2017 1252   HGB  14.2 11/06/2021 0931   HCT 42.7 11/06/2021 0931   PLT 192 11/06/2021 0931   MCV 92 11/06/2021 0931   MCH 30.5 11/06/2021 0931   MCH 21.4 (L) 02/26/2017 1252   MCHC 33.3 11/06/2021 0931   MCHC 29.3 (L) 02/26/2017 1252   RDW 13.3 11/06/2021 0931   LYMPHSABS 1.7 04/04/2021 0954   MONOABS 0.5 02/26/2017 1252   EOSABS 0.1 04/04/2021 0954   BASOSABS 0.0 04/04/2021 0954    BMET    Component Value Date/Time   NA 142 11/06/2021 0931   K 4.3 11/06/2021 0931   CL 107 (H) 11/06/2021 0931   CO2 21 11/06/2021 0931   GLUCOSE 95 11/06/2021 0931   GLUCOSE 91 02/26/2017 1252   BUN 9 11/06/2021 0931   CREATININE 0.47 (L) 11/06/2021 0931   CREATININE 0.44 (L) 01/20/2015 1358   CALCIUM 9.0 11/06/2021 0931   GFRNONAA 117 03/04/2019 1102   GFRAA 135 03/04/2019 1102    BNP No results found for: "BNP"  ProBNP No results found for: "PROBNP"  Imaging: No results found.   Assessment & Plan:   Flu vaccine need - Flu Vaccine QUAD 50moIM (Fluarix, Fluzone & Alfiuria Quad PF)  2. Gastroesophageal reflux disease without esophagitis  - omeprazole (PRILOSEC) 20 MG capsule; Take 1 capsule (20 mg total) by mouth daily.  Dispense: 30 capsule; Refill: 3  3. Diabetes mellitus screening  - POCT glycosylated hemoglobin (Hb A1C)  4. Lower abdominal pain  - [place]domen Complete; Future  5. Vaginal irritation  - NuSwab Vaginitis Plus (VG+) - POCT URINALYSIS DIP (CLINITEK)  Follow up:  Follow up in 3 months     TFenton Foy NP 05/14/2022

## 2022-05-14 ENCOUNTER — Encounter: Payer: Self-pay | Admitting: Nurse Practitioner

## 2022-05-14 DIAGNOSIS — Z23 Encounter for immunization: Secondary | ICD-10-CM | POA: Insufficient documentation

## 2022-05-14 LAB — NUSWAB VAGINITIS PLUS (VG+)
Candida albicans, NAA: NEGATIVE
Candida glabrata, NAA: NEGATIVE
Chlamydia trachomatis, NAA: NEGATIVE
Neisseria gonorrhoeae, NAA: NEGATIVE
Trich vag by NAA: NEGATIVE

## 2022-05-14 NOTE — Patient Instructions (Signed)
1. Flu vaccine need  - Flu Vaccine QUAD 66moIM (Fluarix, Fluzone & Alfiuria Quad PF)  2. Gastroesophageal reflux disease without esophagitis  - omeprazole (PRILOSEC) 20 MG capsule; Take 1 capsule (20 mg total) by mouth daily.  Dispense: 30 capsule; Refill: 3  3. Diabetes mellitus screening  - POCT glycosylated hemoglobin (Hb A1C)  4. Lower abdominal pain  - [place]domen Complete; Future  5. Vaginal irritation  - NuSwab Vaginitis Plus (VG+) - POCT URINALYSIS DIP (CLINITEK)  Follow up:  Follow up in 3 months

## 2022-05-14 NOTE — Assessment & Plan Note (Signed)
-   Flu Vaccine QUAD 63mo+IM (Fluarix, Fluzone & Alfiuria Quad PF)  2. Gastroesophageal reflux disease without esophagitis  - omeprazole (PRILOSEC) 20 MG capsule; Take 1 capsule (20 mg total) by mouth daily.  Dispense: 30 capsule; Refill: 3  3. Diabetes mellitus screening  - POCT glycosylated hemoglobin (Hb A1C)  4. Lower abdominal pain  - [place] Abdomen Complete; Future  5. Vaginal irritation  - NuSwab Vaginitis Plus (VG+) - POCT URINALYSIS DIP (CLINITEK)  Follow up:  Follow up in 3 months

## 2022-05-19 ENCOUNTER — Ambulatory Visit (HOSPITAL_COMMUNITY)
Admission: EM | Admit: 2022-05-19 | Discharge: 2022-05-19 | Disposition: A | Discharge status: Home / Self Care | Payer: PRIVATE HEALTH INSURANCE | Attending: Internal Medicine | Admitting: Internal Medicine

## 2022-05-19 ENCOUNTER — Encounter (HOSPITAL_COMMUNITY): Payer: Self-pay | Admitting: *Deleted

## 2022-05-19 ENCOUNTER — Other Ambulatory Visit: Payer: Self-pay

## 2022-05-19 DIAGNOSIS — J069 Acute upper respiratory infection, unspecified: Secondary | ICD-10-CM

## 2022-05-19 HISTORY — DX: Bronchitis, not specified as acute or chronic: J40

## 2022-05-19 MED ORDER — PROMETHAZINE-DM 6.25-15 MG/5ML PO SYRP: 5.0000 mL | ORAL_SOLUTION | Freq: Four times a day (QID) | ORAL | 0 refills | Status: DC | PRN

## 2022-05-19 MED ORDER — PREDNISONE 20 MG PO TABS: 40.0000 mg | ORAL_TABLET | Freq: Every day | ORAL | 0 refills | Status: AC

## 2022-05-19 MED ORDER — ALBUTEROL SULFATE HFA 108 (90 BASE) MCG/ACT IN AERS: 1.0000 | INHALATION_SPRAY | Freq: Four times a day (QID) | RESPIRATORY_TRACT | 0 refills | Status: DC | PRN

## 2022-05-19 MED ORDER — ALBUTEROL SULFATE HFA 108 (90 BASE) MCG/ACT IN AERS
2.0000 | INHALATION_SPRAY | Freq: Once | RESPIRATORY_TRACT | Status: AC
  Administered 2022-05-19: 2 via RESPIRATORY_TRACT

## 2022-05-19 MED ORDER — ALBUTEROL SULFATE HFA 108 (90 BASE) MCG/ACT IN AERS
INHALATION_SPRAY | RESPIRATORY_TRACT | Status: AC
  Filled 2022-05-19: qty 6.7

## 2022-05-19 NOTE — ED Provider Notes (Signed)
[place]    CSN: [id number] Arrival date & time: 05/19/22  1102      History   Chief Complaint Chief Complaint  Patient presents with   Cough    HPI Brenda Bell is a 52 y.o. female presents urgent care today complaining cough and congestion x 3 days.  Patient reports intermittent wheezing at home.  Home COVID test was negative yesterday.  Patient states symptoms appear worse at night.  She denies any fever, chills, headache, dizziness, sore throat, SOB, abdominal pain, N/V/D.    Past Medical History:  Diagnosis Date   Anemia    B12 deficiency    Bronchitis    Gastritis    Vitamin D deficiency     Patient Active Problem List   Diagnosis Date Noted   Flu vaccine need 05/14/2022   Healthcare maintenance 11/06/2021   Obesity with body mass index 30 or greater 04/04/2021   Anemia 04/04/2021   Menorrhagia 04/04/2021   Neck pain 04/04/2021   Vaginal irritation 04/04/2021   Vitamin D deficiency 04/04/2021   Pain in pelvis 12/02/2020   Abnormal laboratory test result 11/22/2020   Raised TSH level 11/17/2020   Contraceptive management 10/27/2019   Bacterial vaginosis 10/27/2019   Anemia, iron deficiency 08/20/2013   ALLERGIC RHINITIS 01/17/2007   Asthma, mild intermittent 12/30/2006   GERD 12/30/2006   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/06/2005    Past Surgical History:  Procedure Laterality Date   BREAST SURGERY Left    cyst removed   CESAREAN SECTION     CHOLECYSTECTOMY     KNEE SURGERY Right    tendon repair   LAPAROSCOPIC APPENDECTOMY N/A 10/26/2016   Procedure: APPENDECTOMY LAPAROSCOPIC;  Surgeon: Clovis Riley, MD;  Location: [facility] OR;  Service: General;  Laterality: N/A;   OVARY SURGERY Right    benign tumor of right ovary removed, 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 Authorizing Provider  cetirizine (ZYRTEC) 10 MG tablet Take 1 tablet (10 mg total) by mouth daily. 11/06/21  Yes Fenton Foy, NP  predniSONE (DELTASONE) 20 MG tablet Take 2 tablets (40 mg total) by mouth daily with breakfast for 5 days. 05/19/22 05/24/22 Yes Rudolpho Sevin, NP  promethazine-dextromethorphan (PROMETHAZINE-DM) 6.25-15 MG/5ML syrup Take 5 mLs by mouth 4 (four) times daily as needed for cough. 05/19/22  Yes Rudolpho Sevin, NP  albuterol (VENTOLIN HFA) 108 (90 Base) MCG/ACT inhaler Inhale 1-2 puffs into the lungs every 6 (six) hours as needed for wheezing or shortness of breath. 05/19/22   Rudolpho Sevin, NP  ferrous sulfate 325 (65 FE) MG tablet Take 1 tablet by mouth daily. Patient not taking: Reported on 05/09/2022    [provider]  Multiple Vitamin (MULTIVITAMIN) tablet Take 1 tablet by mouth daily. Patient not taking: Reported on 05/09/2022    [provider]  omeprazole (PRILOSEC) 20 MG  capsule Take 1 capsule (20 mg total) by mouth daily. 05/09/22   Fenton Foy, NP  polyethylene glycol powder (GLYCOLAX/MIRALAX) 17 GM/SCOOP powder Take 17 g by mouth daily. Patient not taking: Reported on 11/06/2021 05/08/21   Bo Merino I, NP    Family History Family History  Problem Relation Age of Onset   Hypertension Mother    Diabetes Father     Social History Social History   Tobacco Use   Smoking status: Never   Smokeless tobacco: Never  Vaping Use   Vaping Use: Never used  Substance Use Topics   Alcohol use: No    Alcohol/week: 0.0 standard drinks of alcohol   Drug use: No     Allergies   Hydrocodone-acetaminophen   Review of Systems As stated in HPI otherwise negative   Physical Exam Triage Vital Signs ED Triage Vitals  Enc Vitals Group     BP 05/19/22 1235 113/75     Pulse Rate 05/19/22 1235 85     Resp 05/19/22 1235 18      Temp 05/19/22 1235 97.9 F (36.6 C)     Temp Source 05/19/22 1235 Oral     SpO2 05/19/22 1235 96 %     Weight --      Height --      Head Circumference --      Peak Flow --      Pain Score 05/19/22 1236 0     Pain Loc --      Pain Edu? --      Excl. in [place]? --    No data found.  Updated Vital Signs BP 113/75   Pulse 85   Temp 97.9 F (36.6 C) (Oral)   Resp 18   LMP 11/13/2019   SpO2 96%   Visual Acuity Right Eye Distance:   Left Eye Distance:   Bilateral Distance:    Right Eye Near:   Left Eye Near:    Bilateral Near:     Physical Exam Constitutional:      General: She is not in acute distress.    Appearance: Normal appearance. She is toxic-appearing. She is not ill-appearing.  HENT:     Ears:     Comments: Air-fluid levels bilaterally.  No erythema, bulging or retraction    Nose: Congestion present. No rhinorrhea.     Mouth/Throat:     Mouth: Mucous membranes are moist.     Pharynx: Oropharynx is clear. No oropharyngeal exudate.     Comments: Mild posterior pharyngeal erythema.  No tonsillar swelling or exudate Eyes:     Extraocular Movements: Extraocular movements intact.     Conjunctiva/sclera: Conjunctivae normal.  Cardiovascular:     Rate and Rhythm: Normal rate and regular rhythm.     Heart sounds: No murmur heard.    No friction rub. No gallop.  Pulmonary:     Effort: Pulmonary effort is normal.     Breath sounds: Normal breath sounds. No wheezing, rhonchi or rales.  Abdominal:     General: Bowel sounds are normal.     Palpations: Abdomen is soft.  Musculoskeletal:     Cervical back: Normal range of motion and neck supple.  Lymphadenopathy:     Cervical: No cervical adenopathy.  Skin:    General: Skin is warm and dry.  Neurological:     General: No focal deficit present.     Mental Status: She is alert and oriented to person, place, and time.  Psychiatric:  Mood and Affect: Mood normal.        Behavior: Behavior normal.      UC  Treatments / Results  Labs (all labs ordered are listed, but only abnormal results are displayed) Labs Reviewed - No data to display  EKG   Radiology No results found.  Procedures Procedures (including critical care time)  Medications Ordered in UC Medications  albuterol (VENTOLIN HFA) 108 (90 Base) MCG/ACT inhaler 2 puff (2 puffs Inhalation Given 05/19/22 1402)    Initial Impression / Assessment and Plan / UC Course  I have reviewed the triage vital signs and the nursing notes.  Pertinent labs & imaging results that were available during my care of the patient were reviewed by me and considered in my medical decision making (see chart for details).  Viral URI -With reports of wheezing at home and history of bronchitis, will go ahead and treat with short steroid burst.  Albuterol MDI as needed.  Symptomatic treatment with cetirizine and fluticasone, increase fluid intake, Phenergan DM as needed.  Patient declined repeat COVID testing.  Strict follow-up precautions discussed  Reviewed expections re: course of current medical issues. Questions answered. Outlined signs and symptoms indicating need for more acute intervention. Pt verbalized understanding. AVS given  Final Clinical Impressions(s) / UC Diagnoses   Final diagnoses:  Viral URI with cough     Discharge Instructions      Lo ms probable es que sus sntomas sean de naturaleza viral y que los antibiticos no sean necesarios en este momento. Tratamiento sintomtico con ducha de vapor caliente o humidificador para romper la congestin. Neti pot o aerosol nasal de solucin salina para Theatre manager las fosas nasales humectadas. Aumente la ingesta de lquidos orales para mantener la mucosidad delgada. Acetaminofn y/o ibuprofeno segn las indicaciones para la fiebre o los dolores corporales. Cetirizina y flonasa de venta libre. Regreso a Copy o seguimiento si los sntomas no mejoran o si los sntomas empeoran. Myrtha Mantis     ED Prescriptions     Medication Sig Dispense Auth. Provider   predniSONE (DELTASONE) 20 MG tablet Take 2 tablets (40 mg total) by mouth daily with breakfast for 5 days. 10 tablet Rudolpho Sevin, NP   promethazine-dextromethorphan (PROMETHAZINE-DM) 6.25-15 MG/5ML syrup Take 5 mLs by mouth 4 (four) times daily as needed for cough. 118 mL Rudolpho Sevin, NP   albuterol (VENTOLIN HFA) 108 (90 Base) MCG/ACT inhaler Inhale 1-2 puffs into the lungs every 6 (six) hours as needed for wheezing or shortness of breath. 18 g Rudolpho Sevin, NP      PDMP not reviewed this encounter.   Rudolpho Sevin, NP 05/19/22 1429

## 2022-05-19 NOTE — Discharge Instructions (Addendum)
Lo ms probable es que sus sntomas sean de naturaleza viral y [employer] antibiticos no sean necesarios en este momento. Tratamiento sintomtico con ducha de vapor caliente o humidificador para romper la congestin. Neti pot o aerosol nasal de solucin salina para Theatre manager las fosas nasales humectadas. Aumente la ingesta de lquidos orales para mantener la mucosidad delgada. Acetaminofn y/o ibuprofeno segn las indicaciones para la fiebre o los dolores corporales. Cetirizina y flonasa de venta libre. Regreso a Copy o seguimiento si los sntomas no mejoran o si los sntomas empeoran. Toma cou

## 2022-05-19 NOTE — ED Triage Notes (Addendum)
Pt declines interpreter. C/O cough onset 3 days ago with worsening last night. Audible wheezing noted at times with coughing. Denies any known fevers. Has been using albuterol HFA (last dose was last night -- states about to run out). Reports negative home Covid test yesterday.

## 2022-05-22 ENCOUNTER — Encounter (HOSPITAL_COMMUNITY): Payer: Self-pay | Admitting: Internal Medicine

## 2022-05-22 ENCOUNTER — Ambulatory Visit (INDEPENDENT_AMBULATORY_CARE_PROVIDER_SITE_OTHER): Payer: PRIVATE HEALTH INSURANCE

## 2022-05-22 ENCOUNTER — Ambulatory Visit (HOSPITAL_COMMUNITY)
Admission: EM | Admit: 2022-05-22 | Discharge: 2022-05-22 | Disposition: A | Discharge status: Home / Self Care | Payer: PRIVATE HEALTH INSURANCE | Attending: Internal Medicine | Admitting: Internal Medicine

## 2022-05-22 DIAGNOSIS — J189 Pneumonia, unspecified organism: Secondary | ICD-10-CM

## 2022-05-22 DIAGNOSIS — R0602 Shortness of breath: Secondary | ICD-10-CM | POA: Diagnosis not present

## 2022-05-22 MED ORDER — LEVOFLOXACIN 500 MG PO TABS: 500.0000 mg | ORAL_TABLET | Freq: Every day | ORAL | 0 refills | Status: DC

## 2022-05-22 MED ORDER — IPRATROPIUM-ALBUTEROL 0.5-2.5 (3) MG/3ML IN SOLN
RESPIRATORY_TRACT | Status: AC
  Filled 2022-05-22: qty 3

## 2022-05-22 MED ORDER — IPRATROPIUM-ALBUTEROL 0.5-2.5 (3) MG/3ML IN SOLN
3.0000 mL | Freq: Once | RESPIRATORY_TRACT | Status: AC
  Administered 2022-05-22: 3 mL via RESPIRATORY_TRACT

## 2022-05-22 NOTE — ED Triage Notes (Addendum)
Pt is here for follow up bronchitis . Pt states nasal drainage it has been verrythick . Pt also having SOB

## 2022-05-22 NOTE — Discharge Instructions (Addendum)
Tienes neumonia de ambos pulmones. Tiene que hacerce repetir el rayos equis en 3-4 sepanas para confirmal que se [place], y que algo malo no este aculto tras  de la neumonia con tu medico general   Continue usando tu inhelador

## 2022-05-22 NOTE — ED Provider Notes (Signed)
[place]    CSN: [id number] Arrival date & time: 05/22/22  1617      History   Chief Complaint Chief Complaint  Patient presents with   Follow-up    HPI Brenda Bell is a 52 y.o. female who presents with persistent and unresolved cough and wheezing, and now feeling SOB. Has been using the Albuterol inhaler q 6h, and the prednisone as prescribed. Her cough is mostly non productive, but when she lays down and is wheezing she coughs white phlegm, and today saw green sputum. She took a left over of an antibiotic last night and she was able to rest and  felt she was better today and went to work, but the cough and wheezing returned.     Past Medical History:  Diagnosis Date   Anemia    B12 deficiency    Bronchitis    Gastritis    Vitamin D deficiency     Patient Active Problem List   Diagnosis Date Noted   Flu vaccine need 05/14/2022   Healthcare maintenance 11/06/2021   Obesity with body mass index 30 or greater 04/04/2021   Anemia 04/04/2021   Menorrhagia 04/04/2021   Neck pain 04/04/2021   Vaginal irritation 04/04/2021   Vitamin D deficiency 04/04/2021   Pain in pelvis 12/02/2020   Abnormal laboratory test result 11/22/2020   Raised TSH level 11/17/2020   Contraceptive management 10/27/2019   Bacterial vaginosis 10/27/2019   Anemia, iron deficiency 08/20/2013   ALLERGIC RHINITIS 01/17/2007   Asthma, mild intermittent 12/30/2006   GERD 12/30/2006   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/06/2005    Past Surgical History:  Procedure Laterality Date   BREAST SURGERY Left    cyst removed   CESAREAN SECTION     CHOLECYSTECTOMY     KNEE SURGERY Right    tendon repair   LAPAROSCOPIC APPENDECTOMY N/A 10/26/2016   Procedure: APPENDECTOMY LAPAROSCOPIC;  Surgeon: Clovis Riley, MD;  Location: [facility] OR;  Service: General;  Laterality: N/A;   OVARY SURGERY Right    benign tumor of right ovary removed, 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 Authorizing Provider  levofloxacin (LEVAQUIN) 500 MG tablet Take 1 tablet (500 mg total) by mouth daily. 05/22/22  Yes Rodriguez-Southworth, Sunday Spillers, PA-C  albuterol (VENTOLIN HFA) 108 (90 Base) MCG/ACT inhaler Inhale 1-2 puffs into the lungs every 6 (six) hours as needed for wheezing or shortness of breath. 05/19/22   Rudolpho Sevin, NP  cetirizine (ZYRTEC) 10 MG tablet Take 1 tablet (10 mg total) by mouth daily. 11/06/21   Fenton Foy, NP  omeprazole (PRILOSEC) 20 MG capsule Take 1 capsule (20 mg total) by mouth daily. 05/09/22   Fenton Foy, NP  predniSONE (DELTASONE) 20 MG tablet Take 2 tablets (40 mg total) by mouth daily with breakfast for 5 days. 05/19/22 05/24/22  Rudolpho Sevin, NP  promethazine-dextromethorphan (PROMETHAZINE-DM) 6.25-15 MG/5ML syrup Take 5 mLs by mouth 4 (four) times daily as needed for cough. 05/19/22   Rudolpho Sevin, NP    Family History Family History  Problem Relation Age of Onset   Hypertension Mother    Diabetes Father     Social History Social History   Tobacco Use   Smoking status: Never   Smokeless tobacco: Never  Vaping Use   Vaping Use: Never used  Substance Use Topics   Alcohol use: No    Alcohol/week: 0.0 standard drinks of alcohol   Drug use: No     Allergies   Hydrocodone-acetaminophen   Review of Systems Review of Systems  Constitutional:  Positive for fatigue. Negative for chills, diaphoresis and fever.  HENT:  Positive for congestion, postnasal drip and rhinorrhea. Negative for ear discharge, ear pain and sore throat.   Eyes:  Negative for discharge.  Respiratory:  Positive for cough, shortness of breath and wheezing.   Musculoskeletal:  Negative for myalgias.   Neurological:  Negative for headaches.     Physical Exam Triage Vital Signs ED Triage Vitals  Enc Vitals Group     BP 05/22/22 1700 (!) 143/85     Pulse Rate 05/22/22 1700 92     Resp 05/22/22 1700 20     Temp 05/22/22 1700 98.2 F (36.8 C)     Temp Source 05/22/22 1700 Oral     SpO2 05/22/22 1700 93 %     Weight --      Height --      Head Circumference --      Peak Flow --      Pain Score 05/22/22 1704 4     Pain Loc --      Pain Edu? --      Excl. in [place]? --    No data found.  Updated Vital Signs BP (!) 143/85 (BP Location: Left Arm)   Pulse 92   Temp 98.2 F (36.8 C) (Oral)   Resp 20   LMP 11/13/2019   SpO2 93%  Pulse ox was repeated after duo neb and went back to 98% Visual Acuity Right Eye Distance:   Left Eye Distance:   Bilateral Distance:    Right Eye Near:   Left Eye Near:    Bilateral Near:      Physical Exam Constitutional:      General: He is not in acute distress.    Appearance: He is not toxic-appearing.  HENT:     Head: Normocephalic.     Right Ear: Tympanic membrane, ear canal and external ear normal.     Left Ear: Ear canal and external ear normal.     Nose: Nose normal.     Mouth/Throat:     Mouth: Mucous membranes are moist.     Pharynx: Oropharynx is clear.  Eyes:     General: No scleral icterus.    Conjunctiva/sclera: Conjunctivae normal.  Cardiovascular:     Rate and Rhythm: Normal rate and regular rhythm.     Heart sounds: No murmur heard.   Pulmonary:     Effort: Pulmonary effort is normal. No respiratory distress.     Breath sounds: Wheezing present. Wheezing resolved after neb treatment.     Comments: Has auditory wheezing. Musculoskeletal:        General: Normal range of motion.     Cervical back: Neck supple.  Lymphadenopathy:     Cervical: No cervical adenopathy.  Skin:  General: Skin is warm and dry.     Findings: No rash.  Neurological:     Mental Status: He is alert and oriented to person, place, and  time.     Gait: Gait normal.  Psychiatric:        Mood and Affect: Mood normal.        Behavior: Behavior normal.        Thought Content: Thought content normal.        Judgment: Judgment normal.    UC Treatments / Results  Labs (all labs ordered are listed, but only abnormal results are displayed) Labs Reviewed - No data to display  EKG   Radiology DG Chest 2 View  Result Date: 05/22/2022 CLINICAL DATA:  Follow-up of bronchitis.  Shortness of breath EXAM: CHEST - 2 VIEW COMPARISON:  08/05/2021 FINDINGS: Midline trachea. Normal heart size and mediastinal contours. No pleural effusion or pneumothorax. Multifocal right-sided airspace opacities, most confluent in the inferior right upper lobe. There may be more subtle left lower lobe airspace disease. IMPRESSION: Right and possible left-sided airspace disease, most consistent with pneumonia. Followup PA and lateral chest X-ray is recommended in 3-4 weeks following trial of antibiotic therapy to ensure resolution and exclude underlying malignancy. These results will be called to the ordering clinician or representative by the Radiologist Assistant, and communication documented in the PACS or [employer]. Electronically Signed   By: Abigail Miyamoto M.D.   On: 05/22/2022 17:28    Procedures Procedures (including critical care time)  Medications Ordered in UC Medications  ipratropium-albuterol (DUONEB) 0.5-2.5 (3) MG/3ML nebulizer solution 3 mL (3 mLs Nebulization Given 05/22/22 1727)    Initial Impression / Assessment and Plan / UC Course  I have reviewed the triage vital signs and the nursing notes.  Pertinent  imaging results that were available during my care of the patient were reviewed by me and considered in my medical decision making (see chart for details).   Bilateral pneumonia  I placed her on Levaquin. Needs to continue using the inhaler as noted Advised to FU with PCP in 3-4 weeks to have repeated CXR.  Final Clinical  Impressions(s) / UC Diagnoses   Final diagnoses:  Pneumonia of both lungs due to infectious organism, unspecified part of lung     Discharge Instructions      Tienes neumonia de ambos pulmones. Tiene que hacerce repetir el rayos equis en 3-4 sepanas para confirmal que se [place], y que algo malo no este aculto tras  de la neumonia con tu medico general   Continue usando tu inhelador     ED Prescriptions     Medication Sig Dispense Auth. Provider   levofloxacin (LEVAQUIN) 500 MG tablet Take 1 tablet (500 mg total) by mouth daily. 7 tablet Rodriguez-Southworth, Sunday Spillers, PA-C      PDMP not reviewed this encounter.   Shelby Mattocks, [place] 05/22/22 1954

## 2022-05-25 ENCOUNTER — Ambulatory Visit (INDEPENDENT_AMBULATORY_CARE_PROVIDER_SITE_OTHER): Payer: PRIVATE HEALTH INSURANCE | Admitting: Nurse Practitioner

## 2022-05-25 ENCOUNTER — Encounter: Payer: Self-pay | Admitting: Nurse Practitioner

## 2022-05-25 VITALS — BP 131/73 | HR 80 | Temp 97.7°F | Wt 206.0 lb

## 2022-05-25 DIAGNOSIS — J302 Other seasonal allergic rhinitis: Secondary | ICD-10-CM

## 2022-05-25 DIAGNOSIS — J189 Pneumonia, unspecified organism: Secondary | ICD-10-CM | POA: Diagnosis not present

## 2022-05-25 MED ORDER — GUAIFENESIN ER 600 MG PO TB12: 1200.0000 mg | ORAL_TABLET | Freq: Two times a day (BID) | ORAL | 0 refills | Status: AC

## 2022-05-25 MED ORDER — PREDNISONE 20 MG PO TABS: 20.0000 mg | ORAL_TABLET | Freq: Every day | ORAL | 0 refills | Status: AC

## 2022-05-25 MED ORDER — CETIRIZINE HCL 10 MG PO TABS: 10.0000 mg | ORAL_TABLET | Freq: Every day | ORAL | 11 refills | Status: DC

## 2022-05-25 NOTE — Assessment & Plan Note (Signed)
-   predniSONE (DELTASONE) 20 MG tablet; Take 1 tablet (20 mg total) by mouth daily with breakfast for 5 days.  Dispense: 5 tablet; Refill: 0 - guaiFENesin (MUCINEX) 600 MG 12 hr tablet; Take 2 tablets (1,200 mg total) by mouth 2 (two) times daily for 7 days.  Dispense: 28 tablet; Refill: 0  Follow up in 3 weeks

## 2022-05-25 NOTE — Patient Instructions (Addendum)
1. Pneumonia due to infectious organism, unspecified laterality, unspecified part of lung  - predniSONE (DELTASONE) 20 MG tablet; Take 1 tablet (20 mg total) by mouth daily with breakfast for 5 days.  Dispense: 5 tablet; Refill: 0 - guaiFENesin (MUCINEX) 600 MG 12 hr tablet; Take 2 tablets (1,200 mg total) by mouth 2 (two) times daily for 7 days.  Dispense: 28 tablet; Refill: 0  Follow up in 3 weeks

## 2022-05-25 NOTE — Progress Notes (Signed)
$'@Patient'i$  ID: Brenda Bell, female    DOB: 1970/05/23, 52 y.o.   MRN: [id number]  Chief Complaint  Patient presents with   Cough    For a week two covid test negative    Referring provider: Fenton Foy, NP   HPI  Patient presents today for a follow-up.  She was seen in the ED 3 days ago and was diagnosed with pneumonia.  She states that she continues to cough.  She was prescribed Levaquin and prednisone.  She has completed prednisone.  We discussed that she complete entire course of Levaquin and we will give her a few more days of low-dose steroid.  Patient does have inhaler.  We will order Mucinex.  She will need to come back for repeat x-ray in 3 weeks. Denies f/c/s, n/v/d, hemoptysis, PND, leg swelling Denies chest pain or edema      Allergies  Allergen Reactions   Hydrocodone-Acetaminophen Nausea And Vomiting    Decreased b/p    Immunization History  Administered Date(s) Administered   Influenza Split 01/29/2013   Influenza Whole 01/17/2007   Influenza, Quadrivalent, Recombinant, Inj, Pf 02/02/2019   Influenza,inj,Quad PF,6+ Mos 01/20/2015, 02/09/2016, 02/25/2017, 01/23/2018, 05/09/2022   Influenza-Unspecified 02/14/2021   Pneumococcal Polysaccharide-23 01/17/2007   Td 06/01/2006, 06/18/2006   Tdap 12/25/2012    Past Medical History:  Diagnosis Date   Anemia    B12 deficiency    Bronchitis    Gastritis    Vitamin D deficiency     Tobacco History: Social History   Tobacco Use  Smoking Status Never  Smokeless Tobacco Never   Counseling given: Not Answered   Outpatient Encounter Medications as of 05/25/2022  Medication Sig   albuterol (VENTOLIN HFA) 108 (90 Base) MCG/ACT inhaler Inhale 1-2 puffs into the lungs every 6 (six) hours as needed for wheezing or shortness of breath.   guaiFENesin (MUCINEX) 600 MG 12 hr tablet Take 2 tablets (1,200 mg total) by mouth 2 (two) times daily for 7 days.   levofloxacin (LEVAQUIN) 500 MG tablet Take 1 tablet  (500 mg total) by mouth daily.   predniSONE (DELTASONE) 20 MG tablet Take 1 tablet (20 mg total) by mouth daily with breakfast for 5 days.   promethazine-dextromethorphan (PROMETHAZINE-DM) 6.25-15 MG/5ML syrup Take 5 mLs by mouth 4 (four) times daily as needed for cough.   [DISCONTINUED] cetirizine (ZYRTEC) 10 MG tablet Take 1 tablet (10 mg total) by mouth daily.   cetirizine (ZYRTEC) 10 MG tablet Take 1 tablet (10 mg total) by mouth daily.   omeprazole (PRILOSEC) 20 MG capsule Take 1 capsule (20 mg total) by mouth daily. (Patient not taking: Reported on 05/25/2022)   No facility-administered encounter medications on file as of 05/25/2022.     Review of Systems  Review of Systems  Constitutional: Negative.   HENT:  Positive for congestion.   Respiratory:  Positive for shortness of breath and wheezing.   Cardiovascular: Negative.   Gastrointestinal: Negative.   Allergic/Immunologic: Negative.   Neurological: Negative.   Psychiatric/Behavioral: Negative.         Physical Exam  BP 131/73   Pulse 80   Temp 97.7 F (36.5 C)   Wt 206 lb (93.4 kg)   LMP 11/13/2019   SpO2 99%   BMI 33.81 kg/m   Wt Readings from Last 5 Encounters:  05/25/22 206 lb (93.4 kg)  05/09/22 205 lb 6.4 oz (93.2 kg)  11/06/21 195 lb (88.5 kg)  05/08/21 193 lb (87.5 kg)  04/04/21  194 lb (88 kg)     Physical Exam Vitals and nursing note reviewed.  Constitutional:      General: She is not in acute distress.    Appearance: She is well-developed.  Cardiovascular:     Rate and Rhythm: Normal rate and regular rhythm.  Pulmonary:     Effort: Pulmonary effort is normal.     Breath sounds: Normal breath sounds.  Neurological:     Mental Status: She is alert and oriented to person, place, and time.      Lab Results:  CBC    Component Value Date/Time   WBC 3.5 11/06/2021 0931   WBC 4.2 02/26/2017 1252   RBC 4.65 11/06/2021 0931   RBC 3.87 02/26/2017 1252   HGB 14.2 11/06/2021 0931   HCT 42.7  11/06/2021 0931   PLT 192 11/06/2021 0931   MCV 92 11/06/2021 0931   MCH 30.5 11/06/2021 0931   MCH 21.4 (L) 02/26/2017 1252   MCHC 33.3 11/06/2021 0931   MCHC 29.3 (L) 02/26/2017 1252   RDW 13.3 11/06/2021 0931   LYMPHSABS 1.7 04/04/2021 0954   MONOABS 0.5 02/26/2017 1252   EOSABS 0.1 04/04/2021 0954   BASOSABS 0.0 04/04/2021 0954    BMET    Component Value Date/Time   NA 142 11/06/2021 0931   K 4.3 11/06/2021 0931   CL 107 (H) 11/06/2021 0931   CO2 21 11/06/2021 0931   GLUCOSE 95 11/06/2021 0931   GLUCOSE 91 02/26/2017 1252   BUN 9 11/06/2021 0931   CREATININE 0.47 (L) 11/06/2021 0931   CREATININE 0.44 (L) 01/20/2015 1358   CALCIUM 9.0 11/06/2021 0931   GFRNONAA 117 03/04/2019 1102   GFRAA 135 03/04/2019 1102    BNP No results found for: "BNP"  ProBNP No results found for: "PROBNP"  Imaging: DG Chest 2 View  Result Date: 05/22/2022 CLINICAL DATA:  Follow-up of bronchitis.  Shortness of breath EXAM: CHEST - 2 VIEW COMPARISON:  08/05/2021 FINDINGS: Midline trachea. Normal heart size and mediastinal contours. No pleural effusion or pneumothorax. Multifocal right-sided airspace opacities, most confluent in the inferior right upper lobe. There may be more subtle left lower lobe airspace disease. IMPRESSION: Right and possible left-sided airspace disease, most consistent with pneumonia. Followup PA and lateral chest X-ray is recommended in 3-4 weeks following trial of antibiotic therapy to ensure resolution and exclude underlying malignancy. These results will be called to the ordering clinician or representative by the Radiologist Assistant, and communication documented in the PACS or [employer]. Electronically Signed   By: Abigail Miyamoto M.D.   On: 05/22/2022 17:28     Assessment & Plan:   Pneumonia due to infectious organism - predniSONE (DELTASONE) 20 MG tablet; Take 1 tablet (20 mg total) by mouth daily with breakfast for 5 days.  Dispense: 5 tablet; Refill: 0 -  guaiFENesin (MUCINEX) 600 MG 12 hr tablet; Take 2 tablets (1,200 mg total) by mouth 2 (two) times daily for 7 days.  Dispense: 28 tablet; Refill: 0  Follow up in 3 weeks     Fenton Foy, NP 05/25/2022

## 2022-05-26 IMAGING — DX DG CHEST 2V
2 series · 2 of 2 positions shown · non-contrast
Comparison: Chest radiograph 11/04/2016

CLINICAL DATA: Cough and shortness of breath.

EXAM:
CHEST - 2 VIEW

[chest pa]
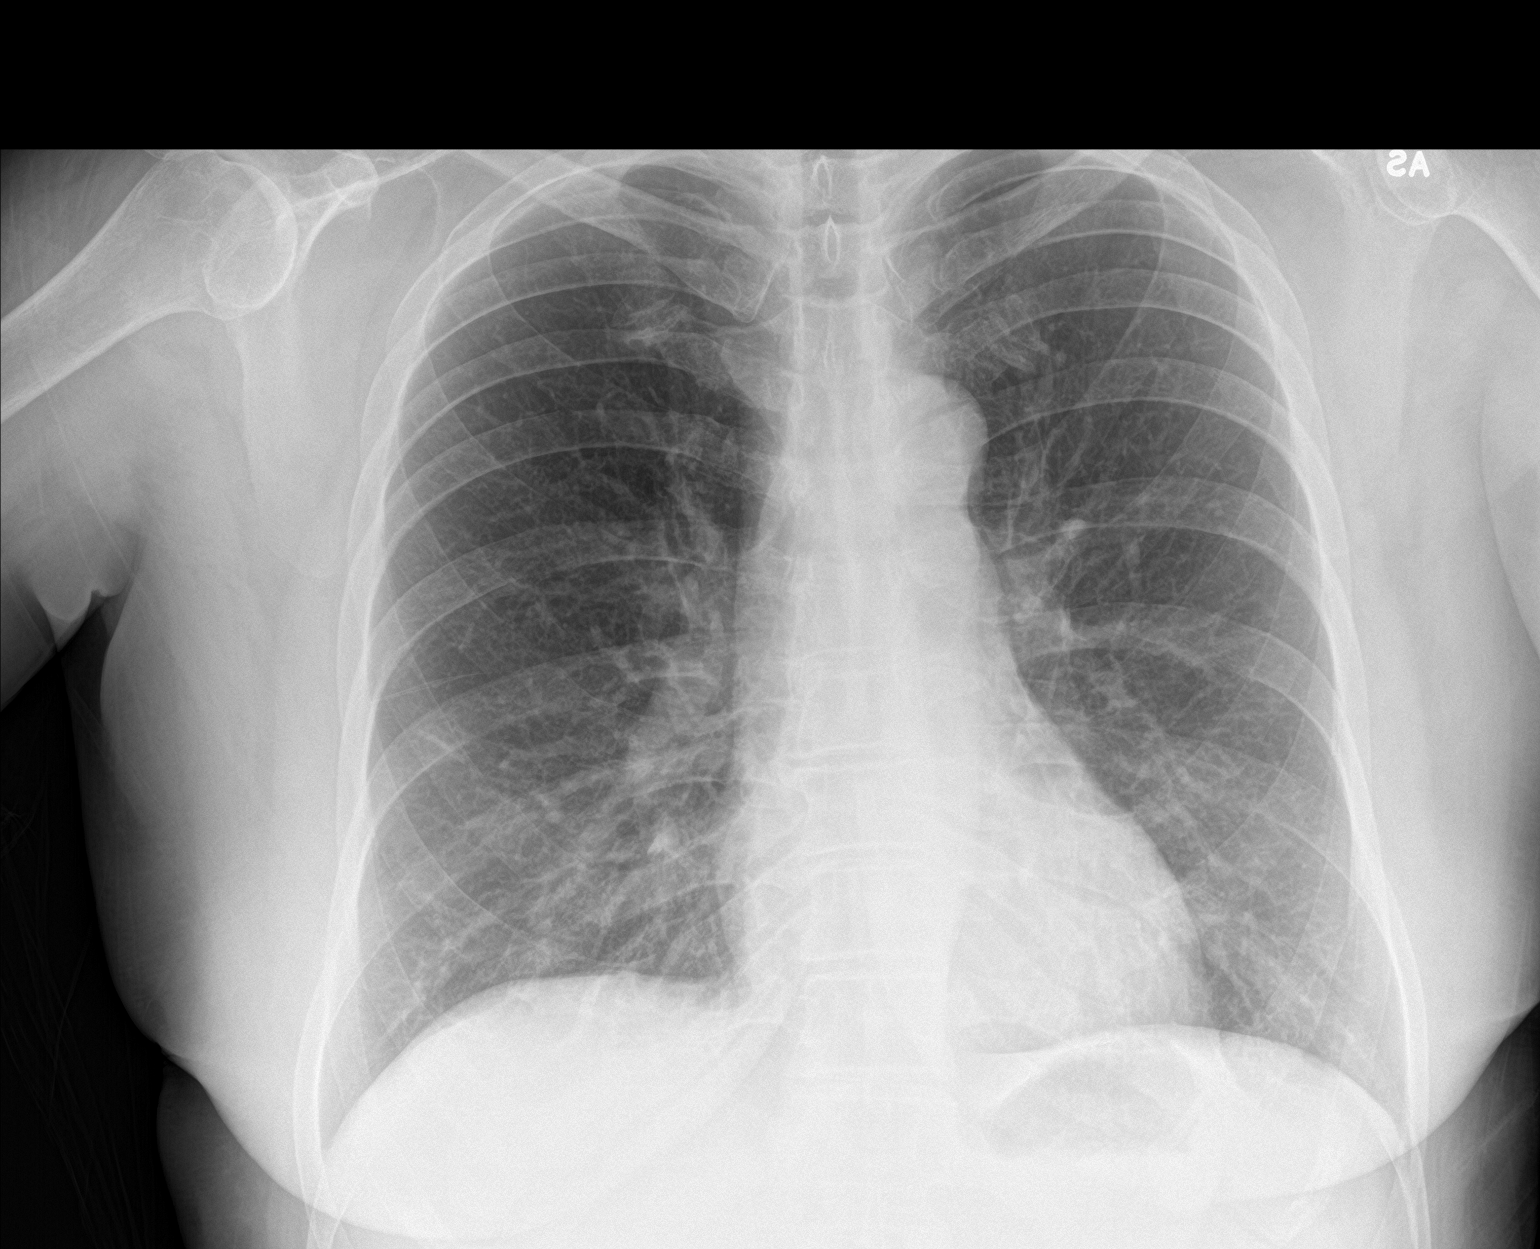

[chest lat]
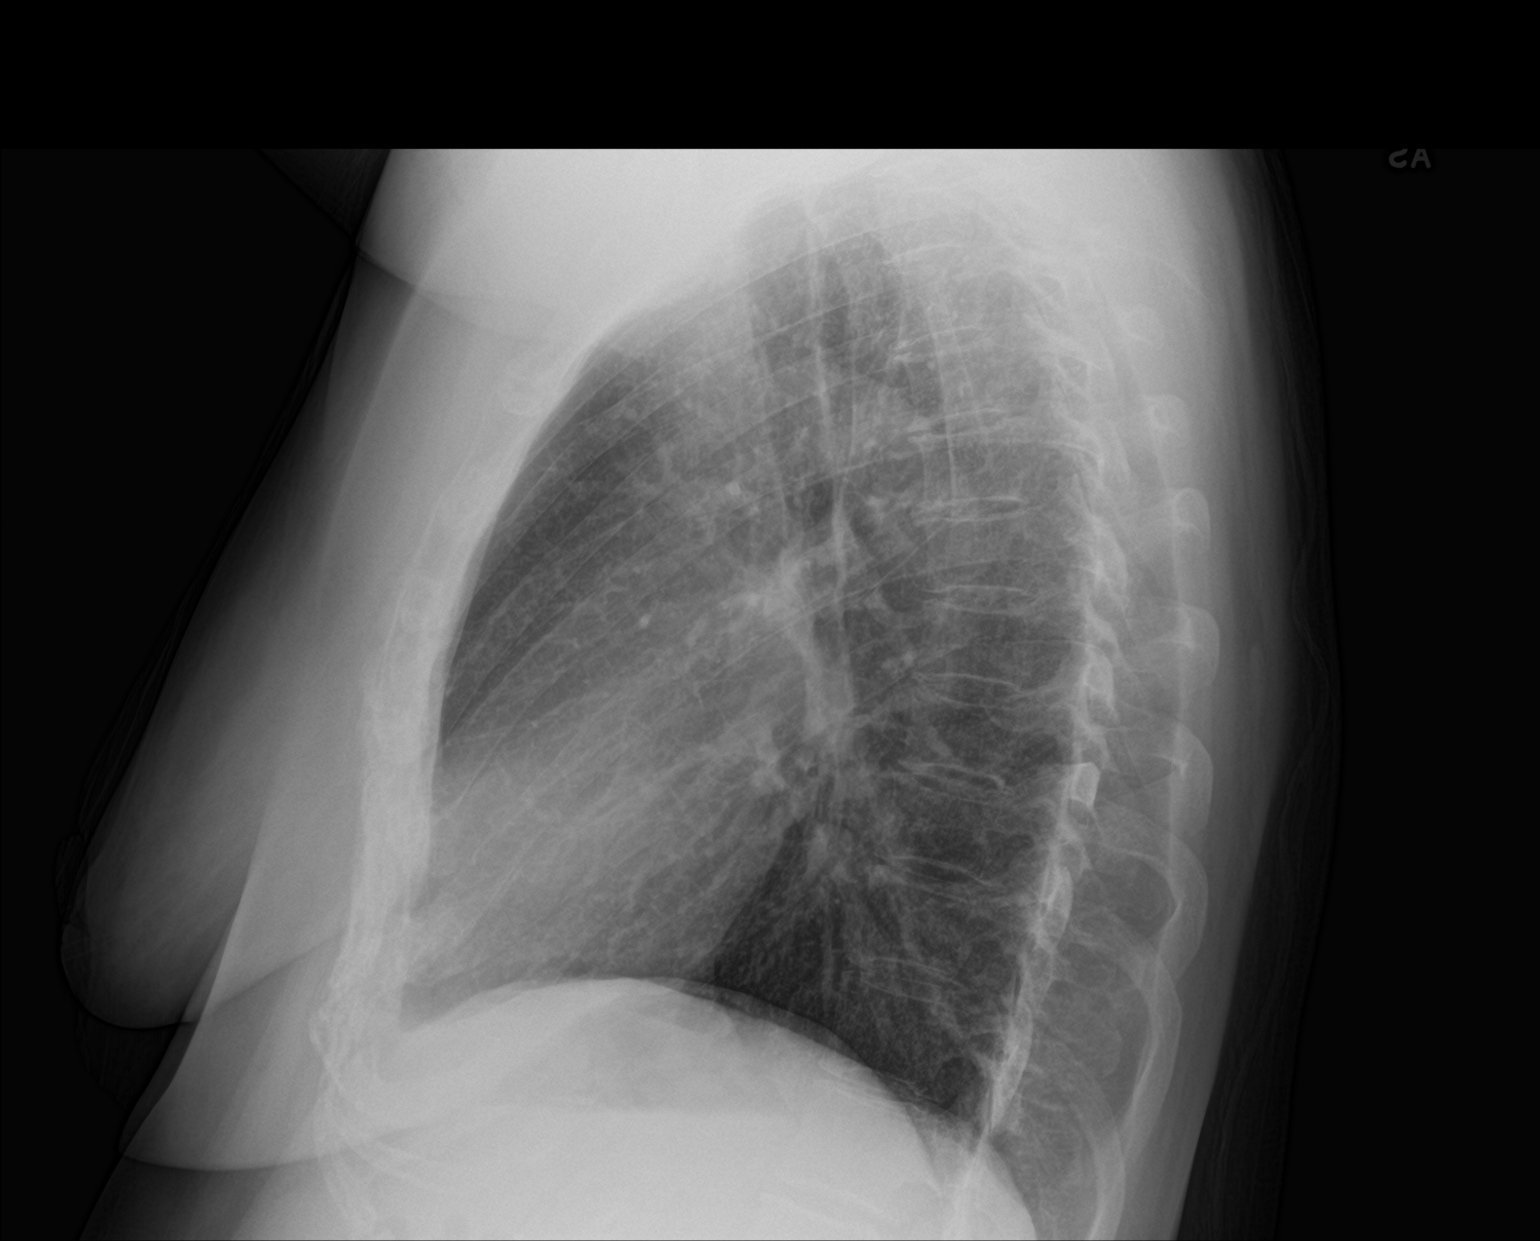

[2 of 2 positions shown; findings below may reference images not displayed]

FINDINGS: The cardiomediastinal contours are within normal limits. The lungs
are clear. No pneumothorax or pleural effusion. No acute finding in
the visualized skeleton.
IMPRESSION: No active cardiopulmonary disease.

## 2022-06-03 ENCOUNTER — Other Ambulatory Visit: Payer: Self-pay

## 2022-06-03 ENCOUNTER — Emergency Department (HOSPITAL_BASED_OUTPATIENT_CLINIC_OR_DEPARTMENT_OTHER)
Admission: EM | Admit: 2022-06-03 | Discharge: 2022-06-03 | Disposition: A | Discharge status: Home / Self Care | Payer: 59 | Attending: Emergency Medicine | Admitting: Emergency Medicine

## 2022-06-03 ENCOUNTER — Encounter (HOSPITAL_BASED_OUTPATIENT_CLINIC_OR_DEPARTMENT_OTHER): Payer: Self-pay | Admitting: Urology

## 2022-06-03 ENCOUNTER — Emergency Department (HOSPITAL_BASED_OUTPATIENT_CLINIC_OR_DEPARTMENT_OTHER): Payer: 59

## 2022-06-03 DIAGNOSIS — N939 Abnormal uterine and vaginal bleeding, unspecified: Secondary | ICD-10-CM | POA: Diagnosis not present

## 2022-06-03 DIAGNOSIS — N950 Postmenopausal bleeding: Secondary | ICD-10-CM | POA: Diagnosis not present

## 2022-06-03 LAB — COMPREHENSIVE METABOLIC PANEL
ALT: 26 U/L (ref 0–44)
AST: 30 U/L (ref 15–41)
Albumin: 3.3 g/dL — ABNORMAL LOW (ref 3.5–5.0)
Alkaline Phosphatase: 49 U/L (ref 38–126)
Anion gap: 3 — ABNORMAL LOW (ref 5–15)
BUN: 9 mg/dL (ref 6–20)
CO2: 23 mmol/L (ref 22–32)
Calcium: 7.7 mg/dL — ABNORMAL LOW (ref 8.9–10.3)
Chloride: 108 mmol/L (ref 98–111)
Creatinine, Ser: 0.41 mg/dL — ABNORMAL LOW (ref 0.44–1.00)
GFR, Estimated: >60 mL/min (ref >60)
Glucose, Bld: 85 mg/dL (ref 70–99)
Potassium: 3.8 mmol/L (ref 3.5–5.1)
Sodium: 134 mmol/L — ABNORMAL LOW (ref 135–145)
Total Bilirubin: 1 mg/dL (ref 0.3–1.2)
Total Protein: 6.6 g/dL (ref 6.5–8.1)

## 2022-06-03 LAB — URINALYSIS, ROUTINE W REFLEX MICROSCOPIC
Bilirubin Urine: NEGATIVE
Glucose, UA: NEGATIVE mg/dL
Ketones, ur: NEGATIVE mg/dL
Leukocytes,Ua: NEGATIVE
Nitrite: NEGATIVE
Protein, ur: NEGATIVE mg/dL
Specific Gravity, Urine: 1.02 (ref 1.005–1.030)
pH: 6.5 (ref 5.0–8.0)

## 2022-06-03 LAB — URINALYSIS, MICROSCOPIC (REFLEX)

## 2022-06-03 LAB — CBC WITH DIFFERENTIAL/PLATELET
Abs Immature Granulocytes: 0.01 10*3/uL (ref 0.00–0.07)
Basophils Absolute: 0 10*3/uL (ref 0.0–0.1)
Basophils Relative: 0 %
Eosinophils Absolute: 0.1 10*3/uL (ref 0.0–0.5)
Eosinophils Relative: 4 %
HCT: 40.9 % (ref 36.0–46.0)
Hemoglobin: 13.7 g/dL (ref 12.0–15.0)
Immature Granulocytes: 0 %
Lymphocytes Relative: 38 %
Lymphs Abs: 1.4 10*3/uL (ref 0.7–4.0)
MCH: 30.7 pg (ref 26.0–34.0)
MCHC: 33.5 g/dL (ref 30.0–36.0)
MCV: 91.7 fL (ref 80.0–100.0)
Monocytes Absolute: 0.6 10*3/uL (ref 0.1–1.0)
Monocytes Relative: 16 %
Neutro Abs: 1.5 10*3/uL — ABNORMAL LOW (ref 1.7–7.7)
Neutrophils Relative %: 42 %
Platelets: 199 10*3/uL (ref 150–400)
RBC: 4.46 MIL/uL (ref 3.87–5.11)
RDW: 13.6 % (ref 11.5–15.5)
WBC: 3.6 10*3/uL — ABNORMAL LOW (ref 4.0–10.5)
nRBC: 0 % (ref 0.0–0.2)

## 2022-06-03 LAB — HCG, QUANTITATIVE, PREGNANCY: hCG, Beta Chain, Quant, S: <1 m[IU]/mL (ref <5)

## 2022-06-03 LAB — WET PREP, GENITAL
Clue Cells Wet Prep HPF POC: NONE SEEN
Sperm: NONE SEEN
Trich, Wet Prep: NONE SEEN
WBC, Wet Prep HPF POC: <10 (ref <10)
Yeast Wet Prep HPF POC: NONE SEEN

## 2022-06-03 MED ORDER — TRANEXAMIC ACID-NACL 1000-0.7 MG/100ML-% IV SOLN
1000.0000 mg | Freq: Once | INTRAVENOUS | Status: AC
  Administered 2022-06-03: 1000 mg via INTRAVENOUS
  Filled 2022-06-03: qty 100

## 2022-06-03 MED ORDER — TRANEXAMIC ACID 650 MG PO TABS: 1300.0000 mg | ORAL_TABLET | Freq: Three times a day (TID) | ORAL | 0 refills | Status: DC

## 2022-06-03 NOTE — Discharge Instructions (Addendum)
Please follow-up with the gynecologist, they should call you to schedule an appointment for a biopsy, but if they do not please call the number above.  If you have dizziness, shortness of breath, fatigue please return to the ER.  Take the medication as prescribed.

## 2022-06-03 NOTE — ED Notes (Signed)
Attempted IV acces, vein infiltrated. Will attempt again if IV meds ordered.

## 2022-06-03 NOTE — ED Provider Notes (Signed)
[facility] Provider Note   CSN: [id number] Arrival date & time: 06/03/22  1008     History  Chief Complaint  Patient presents with   Vaginal Bleeding    Brenda Bell is a 52 y.o. female, history of fibroids, who presents to the ED secondary to heavy vaginal bleeding that is been going on for the last 3 days.  She states that this started Friday, and today she has bled through 4 pads in 4 hours.  She denies any shortness of breath, dizziness, however does endorse some fatigue.  History of anemia she states.  She notes that she is concerned because she has not had a period in the past year, and this is the first time she has had a period since then.  Also states that she is concerned because she had green fluid come out of her vagina about a month ago, she notes she has not noticed any since she has had vaginal bleeding, but is concerned about it.  Also reports suprapubic discomfort.    Home Medications Prior to Admission medications   Medication Sig Start Date End Date Taking? Authorizing Provider  tranexamic acid (LYSTEDA) 650 MG TABS tablet Take 2 tablets (1,300 mg total) by mouth 3 (three) times daily. 06/03/22  Yes Ruble Pumphrey L, PA  albuterol (VENTOLIN HFA) 108 (90 Base) MCG/ACT inhaler Inhale 1-2 puffs into the lungs every 6 (six) hours as needed for wheezing or shortness of breath. 05/19/22   Rudolpho Sevin, NP  cetirizine (ZYRTEC) 10 MG tablet Take 1 tablet (10 mg total) by mouth daily. 05/25/22   Fenton Foy, NP  levofloxacin (LEVAQUIN) 500 MG tablet Take 1 tablet (500 mg total) by mouth daily. 05/22/22   Rodriguez-Southworth, Sunday Spillers, PA-C  omeprazole (PRILOSEC) 20 MG capsule Take 1 capsule (20 mg total) by mouth daily. Patient not taking: Reported on 05/25/2022 05/09/22   Fenton Foy, NP  promethazine-dextromethorphan (PROMETHAZINE-DM) 6.25-15 MG/5ML syrup Take 5 mLs by mouth 4 (four) times daily as needed for cough. 05/19/22    Rudolpho Sevin, NP      Allergies    Hydrocodone-acetaminophen    Review of Systems   Review of Systems  Gastrointestinal:  Positive for abdominal pain. Negative for nausea and vomiting.  Genitourinary:  Positive for vaginal bleeding and vaginal discharge.    Physical Exam Updated Vital Signs BP 131/78   Pulse 70   Temp 98.1 F (36.7 C) (Oral)   Resp 18   Ht '5\' 5"'$  (1.651 m)   Wt 93.4 kg   LMP 11/13/2019   SpO2 98%   BMI 34.27 kg/m  Physical Exam Vitals and nursing note reviewed. Exam conducted with a chaperone present.  Constitutional:      General: She is not in acute distress.    Appearance: She is well-developed.  HENT:     Head: Normocephalic and atraumatic.  Eyes:     Conjunctiva/sclera: Conjunctivae normal.  Cardiovascular:     Rate and Rhythm: Normal rate and regular rhythm.     Heart sounds: No murmur heard. Pulmonary:     Effort: Pulmonary effort is normal. No respiratory distress.     Breath sounds: Normal breath sounds.  Abdominal:     Palpations: Abdomen is soft.     Tenderness: There is abdominal tenderness in the suprapubic area.  Genitourinary:    General: Normal vulva.     Vagina: Normal.     Cervix: Cervical bleeding present.  Musculoskeletal:        General: No swelling.     Cervical back: Neck supple.  Skin:    General: Skin is warm and dry.     Capillary Refill: Capillary refill takes less than 2 seconds.  Neurological:     Mental Status: She is alert.  Psychiatric:        Mood and Affect: Mood normal.     ED Results / Procedures / Treatments   Labs (all labs ordered are listed, but only abnormal results are displayed) Labs Reviewed  CBC WITH DIFFERENTIAL/PLATELET - Abnormal; Notable for the following components:      Result Value   WBC 3.6 (*)    Neutro Abs 1.5 (*)    All other components within normal limits  COMPREHENSIVE METABOLIC PANEL - Abnormal; Notable for the following components:   Sodium 134 (*)    Creatinine, Ser  0.41 (*)    Calcium 7.7 (*)    Albumin 3.3 (*)    Anion gap 3 (*)    All other components within normal limits  URINALYSIS, ROUTINE W REFLEX MICROSCOPIC - Abnormal; Notable for the following components:   Hgb urine dipstick LARGE (*)    All other components within normal limits  URINALYSIS, MICROSCOPIC (REFLEX) - Abnormal; Notable for the following components:   Bacteria, UA FEW (*)    All other components within normal limits  WET PREP, GENITAL  HCG, QUANTITATIVE, PREGNANCY  GC/CHLAMYDIA PROBE AMP ([place]) NOT AT [facility]    EKG None  Radiology US PELVIC COMPLETE WITH TRANSVAGINAL  Result Date: 06/03/2022 CLINICAL DATA:  Heavy bleeding. Postmenopausal woman. History of fibroid. History of right ovarian tumor removed at age 58. EXAM: TRANSABDOMINAL AND TRANSVAGINAL ULTRASOUND OF PELVIS TECHNIQUE: Both transabdominal and transvaginal ultrasound examinations of the pelvis were performed. Transabdominal technique was performed for global imaging of the pelvis including uterus, ovaries, adnexal regions, and pelvic cul-de-sac. It was necessary to proceed with endovaginal exam following the transabdominal exam to visualize the endometrium and ovaries. COMPARISON:  CT scan of the abdomen and pelvis October 26, 2016. Pelvic ultrasound June 11, 2019. FINDINGS: Uterus Measurements: 9.7 x 4.8 x 7.0 cm = volume: 170 mL. Again noted is a mass in the posterior uterine body measuring 2.0 x 2.0 x 1.8 cm today versus 1.5 x 1.6 x 1.8 cm on the comparison ultrasound from 2021. The mass demonstrates significant vascularity. Endometrium Thickness: 7.2 mm.  No focal abnormality visualized. Right ovary Not visualized.  Possibly surgically absent. Left ovary Measurements: 3.5 x 2.0 x 2.1 cm = volume: 7.8 mL. Normal appearance/no adnexal mass. Other findings No abnormal free fluid. IMPRESSION: 1. The endometrium measures 7.2 mm in thickness in this postmenopausal woman. In the setting of post-menopausal bleeding,  endometrial sampling is indicated to exclude carcinoma. If results are benign, sonohysterogram should be considered for focal lesion work-up. (Ref: Radiological Reasoning: Algorithmic Workup of Abnormal Vaginal Bleeding with Endovaginal Sonography and Sonohysterography. AJR 2008; [id number]) 2. The 2 x 2 x 1.8 cm mass in the posterior uterine body is favored to represent a submucosal fibroid. The mass has slowly grown since first identified on CT imaging October 26, 2016 and the mass is hypervascular. Consider sonohysterogram at the time of endometrial biopsy to exclude the less likely possibility this mass is endometrial in origin. Electronically Signed   By: Dorise Bullion III M.D.   On: 06/03/2022 16:39    Procedures Procedures    Medications Ordered in ED Medications  tranexamic acid (  CYKLOKAPRON) IVPB 1,000 mg (has no administration in time range)    ED Course/ Medical Decision Making/ A&P                             Medical Decision Making Patient is a 52 year old female, history of abdominal tumor, who presents to the ED secondary to suprapubic discomfort, and vaginal bleeding for the last 3 days.  She states she is postmenopausal, and that is not where she is bleeding.  Denies any weight loss, fatigue.  We will obtain CBC, CMP, beta-hCG, transvaginal ultrasound, for further evaluations as well as gonorrhea chlamydia, trichomoniasis prep given that she had green discharge not too long ago.  Amount and/or Complexity of Data Reviewed Labs: ordered.    Details: Hemoglobin 13+, negative trichomoniasis prep Radiology: ordered.    Details: Transvaginal ultrasound shows likely fibroid, and recommends a endometrial biopsy, and sonohysterogram Discussion of management or test interpretation with external provider(s): Discussed with Dr. Damita Dunnings, she agrees with TXA for patient given vaginal bleeding, and will send a message to her scheduler, to schedule the patient for a endometrial biopsy, and  sonohysterogram soon.  Discussed with patient, we will start her on TXA, we discussed the importance of following up with gynecology, I provided her with information for the number, as well as strict return precautions.  I discharged her with TXA, and she was well-appearing at the time.  Risk Prescription drug management.   Final Clinical Impression(s) / ED Diagnoses Final diagnoses:  Abnormal uterine bleeding (AUB)    Rx / DC Orders ED Discharge Orders          Ordered    tranexamic acid (LYSTEDA) 650 MG TABS tablet  3 times daily        06/03/22 1654              Shon Indelicato, [place], [place] 06/03/22 1704    Charlesetta Shanks, MD 06/06/22 1751

## 2022-06-03 NOTE — ED Triage Notes (Signed)
Pt states very heavy vaginal bleeding that started Friday  Has not had menstruation in approx 1 year   Pt states had green fluid that came out 1 month ago   Saw pcp on 2/13 for discharge and was told it was normal  H/o abdominal tumor at age 52

## 2022-06-03 NOTE — Consult Note (Addendum)
   OB/GYN Telephone Consult  Brenda Bell is a 52 y.o. [phone] presenting with PMB.   I was called for a consult regarding the care of this patient by [facility]    The provider had a clinical question regarding management and follow up for PMB.   The provider presented the following relevant clinical information and I performed a chart review on the patient and reviewed available documentation:  Pt is a 52 yo who presented with heavy bleeding. She has gone about one year without a period. Her HgB is normal (13) and her bleeding is heavy but she thinks amenable to outpatient management. Her vitals are stable.   I reviewed her pelvic ultrasound images independently and my findings are: She does have a thickened EL. The posterior mass does appear c/w fibroid and appears to be submucosal as noted - it is well circumscribed and appears to be a Class 1-2 fibroid. It is about 2 cm in size. I reviewed the report from 06/2019 and it appears to have been there at that time, and is overall unchanged vs grown slightly since 2021 which would not be atypical since she would have only recently been consider postmenopausal. Ovaries were normal in appearance.   I reviewed CareEverywhere: No gyne notes or imaging found.   BP 131/78   Pulse 70   Temp 98.1 F (36.7 C) (Oral)   Resp 18   Ht '5\' 5"'$  (1.651 m)   Wt 93.4 kg   LMP 11/13/2019   SpO2 98%   BMI 34.27 kg/m   Exam- performed by consulting provider   Recommendations:  - Agree with plan for EMB in the office - Can give TXA IV now and then for 5 days Q8 hr to help control the bleeding.  - Bleeding could be due to 41m lining but may also be related to what appears to be a submucosal fibroid. Either way, next best step is EMB.  - If refractory to TXA, would then consider Megace 40 BID.  - [facility] [place] [facility] [place] get her in to the office in the next 1-2 weeks with any provider for EMB  Thank you  for this consult and if additional recommendations are needed please call [phone] the OB/GYN attending on [facility] at [facility]   I spent approximately 5 minutes directly consulting with the provider and verbally discussing this case. Additionally 10 minutes minutes was spent performing chart review and documentation.   PRadene Gunning MD Attending [facility]

## 2022-06-04 LAB — GC/CHLAMYDIA PROBE AMP (~~LOC~~) NOT AT ARMC
Chlamydia: NEGATIVE
Comment: NEGATIVE
Comment: NORMAL
Neisseria Gonorrhea: NEGATIVE

## 2022-06-07 ENCOUNTER — Telehealth: Payer: Self-pay

## 2022-06-07 NOTE — Transitions of Care (Post Inpatient/ED Visit) (Signed)
   06/07/2022  Name: [name] MRN: [id number] DOB: 05/04/1970  Today's TOC FU Call Status: Today's TOC FU Call Status:: Unsuccessul Call (1st Attempt) Unsuccessful Call (1st Attempt) Date: 06/07/22  Attempted to reach the patient regarding the most recent Inpatient/ED visit.  Follow Up Plan: Additional outreach attempts will be made to reach the patient to complete the Transitions of Care (Post Inpatient/ED visit) call.   [place] RMA

## 2022-06-09 ENCOUNTER — Other Ambulatory Visit: Payer: Self-pay

## 2022-06-09 ENCOUNTER — Emergency Department (HOSPITAL_BASED_OUTPATIENT_CLINIC_OR_DEPARTMENT_OTHER): Payer: 59

## 2022-06-09 ENCOUNTER — Emergency Department (HOSPITAL_BASED_OUTPATIENT_CLINIC_OR_DEPARTMENT_OTHER)
Admission: EM | Admit: 2022-06-09 | Discharge: 2022-06-10 | Disposition: A | Discharge status: Home / Self Care | Payer: 59 | Attending: Emergency Medicine | Admitting: Emergency Medicine

## 2022-06-09 ENCOUNTER — Emergency Department (HOSPITAL_COMMUNITY): Payer: 59

## 2022-06-09 ENCOUNTER — Encounter (HOSPITAL_BASED_OUTPATIENT_CLINIC_OR_DEPARTMENT_OTHER): Payer: Self-pay

## 2022-06-09 DIAGNOSIS — R1084 Generalized abdominal pain: Secondary | ICD-10-CM | POA: Diagnosis not present

## 2022-06-09 DIAGNOSIS — R110 Nausea: Secondary | ICD-10-CM | POA: Insufficient documentation

## 2022-06-09 DIAGNOSIS — Z7951 Long term (current) use of inhaled steroids: Secondary | ICD-10-CM | POA: Insufficient documentation

## 2022-06-09 DIAGNOSIS — J45909 Unspecified asthma, uncomplicated: Secondary | ICD-10-CM | POA: Diagnosis not present

## 2022-06-09 DIAGNOSIS — R1032 Left lower quadrant pain: Secondary | ICD-10-CM | POA: Diagnosis present

## 2022-06-09 LAB — COMPREHENSIVE METABOLIC PANEL
ALT: 28 U/L (ref 0–44)
AST: 26 U/L (ref 15–41)
Albumin: 3.5 g/dL (ref 3.5–5.0)
Alkaline Phosphatase: 56 U/L (ref 38–126)
Anion gap: 1 — ABNORMAL LOW (ref 5–15)
BUN: 8 mg/dL (ref 6–20)
CO2: 27 mmol/L (ref 22–32)
Calcium: 7.9 mg/dL — ABNORMAL LOW (ref 8.9–10.3)
Chloride: 107 mmol/L (ref 98–111)
Creatinine, Ser: 0.59 mg/dL (ref 0.44–1.00)
GFR, Estimated: >60 mL/min (ref >60)
Glucose, Bld: 105 mg/dL — ABNORMAL HIGH (ref 70–99)
Potassium: 3.4 mmol/L — ABNORMAL LOW (ref 3.5–5.1)
Sodium: 135 mmol/L (ref 135–145)
Total Bilirubin: 0.8 mg/dL (ref 0.3–1.2)
Total Protein: 6.9 g/dL (ref 6.5–8.1)

## 2022-06-09 LAB — URINALYSIS, ROUTINE W REFLEX MICROSCOPIC
Bilirubin Urine: NEGATIVE
Glucose, UA: NEGATIVE mg/dL
Ketones, ur: NEGATIVE mg/dL
Leukocytes,Ua: NEGATIVE
Nitrite: NEGATIVE
Protein, ur: NEGATIVE mg/dL
Specific Gravity, Urine: 1.015 (ref 1.005–1.030)
pH: 7 (ref 5.0–8.0)

## 2022-06-09 LAB — CBC
HCT: 39.6 % (ref 36.0–46.0)
Hemoglobin: 13.2 g/dL (ref 12.0–15.0)
MCH: 30.4 pg (ref 26.0–34.0)
MCHC: 33.3 g/dL (ref 30.0–36.0)
MCV: 91.2 fL (ref 80.0–100.0)
Platelets: 236 10*3/uL (ref 150–400)
RBC: 4.34 MIL/uL (ref 3.87–5.11)
RDW: 13.2 % (ref 11.5–15.5)
WBC: 4.5 10*3/uL (ref 4.0–10.5)
nRBC: 0 % (ref 0.0–0.2)

## 2022-06-09 LAB — URINALYSIS, MICROSCOPIC (REFLEX): WBC, UA: NONE SEEN WBC/hpf (ref 0–5)

## 2022-06-09 LAB — PREGNANCY, URINE: Preg Test, Ur: NEGATIVE

## 2022-06-09 LAB — LIPASE, BLOOD: Lipase: 34 U/L (ref 11–51)

## 2022-06-09 MED ORDER — ONDANSETRON HCL 4 MG/2ML IJ SOLN
4.0000 mg | Freq: Once | INTRAMUSCULAR | Status: AC
  Administered 2022-06-09: 4 mg via INTRAVENOUS
  Filled 2022-06-09: qty 2

## 2022-06-09 MED ORDER — IOHEXOL 350 MG/ML SOLN
75.0000 mL | Freq: Once | INTRAVENOUS | Status: AC | PRN
  Administered 2022-06-09: 75 mL via INTRAVENOUS

## 2022-06-09 MED ORDER — KETOROLAC TROMETHAMINE 15 MG/ML IJ SOLN
15.0000 mg | Freq: Once | INTRAMUSCULAR | Status: AC
  Administered 2022-06-09: 15 mg via INTRAVENOUS
  Filled 2022-06-09: qty 1

## 2022-06-09 NOTE — ED Triage Notes (Signed)
Pt here for L side abd pain that started this morning when she woke up. Pt reports nausea, no vomiting. Pt reports pain radiates to L flank.

## 2022-06-09 NOTE — ED Provider Notes (Signed)
Patient accepted in transfer from outside emergency department due to CT scan not being available.  Patient presented with left side abdominal pain different than previous.  Differential includes diverticulitis/colitis/kidney stone/other.  Plan for CT scan with IV contrast.  Patient comfortable and does not want pain meds at this time. CT scan results independently reviewed no acute abnormalities.  Patient stable for discharge vital signs normal.  Generalized abdominal pain    Elnora Morrison, MD 06/09/22 2349

## 2022-06-09 NOTE — Discharge Instructions (Signed)
Your CT scan did not show any new concerns. You can try Pepcid as needed for stomach related pain. Tylenol every 4 hours and ibuprofen every 6 hours for pain. Follow-up with local doctor if symptoms persist on Monday.

## 2022-06-09 NOTE — ED Provider Notes (Signed)
[facility] Provider Note  CSN: [id number] Arrival date & time: 06/09/22 1644  Chief Complaint(s) Abdominal Pain  HPI Brenda Bell is a 52 y.o. female history of obesity presenting to the emergency department with abdominal pain.  She reports abdominal pain began today, starting in the left lower quadrant.  She reports it radiates from her flank.  She reports some nausea, no vomiting.  No fevers or chills.  No vaginal bleeding.  No diarrhea.  No bloody stool or hematemesis.  No chest pain, shortness of breath.  Patient seen in the ER few days ago for vaginal bleeding, was started on TXA, reports that the vaginal bleeding has stopped.  Patient declines Spanish interpreter  Past Medical History Past Medical History:  Diagnosis Date   Anemia    B12 deficiency    Bronchitis    Gastritis    Vitamin D deficiency    Patient Active Problem List   Diagnosis Date Noted   Pneumonia due to infectious organism 05/25/2022   Flu vaccine need 05/14/2022   Healthcare maintenance 11/06/2021   Obesity with body mass index 30 or greater 04/04/2021   Anemia 04/04/2021   Menorrhagia 04/04/2021   Neck pain 04/04/2021   Vaginal irritation 04/04/2021   Vitamin D deficiency 04/04/2021   Pain in pelvis 12/02/2020   Abnormal laboratory test result 11/22/2020   Raised TSH level 11/17/2020   Contraceptive management 10/27/2019   Bacterial vaginosis 10/27/2019   Anemia, iron deficiency 08/20/2013   ALLERGIC RHINITIS 01/17/2007   Asthma, mild intermittent 12/30/2006   GERD 12/30/2006   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 Authorizing Provider  albuterol (VENTOLIN HFA) 108 (90 Base) MCG/ACT inhaler Inhale 1-2 puffs into the lungs every 6 (six) hours as needed for wheezing or shortness of breath. 05/19/22   Rudolpho Sevin, NP  cetirizine (ZYRTEC) 10 MG tablet Take 1 tablet  (10 mg total) by mouth daily. 05/25/22   Fenton Foy, NP  levofloxacin (LEVAQUIN) 500 MG tablet Take 1 tablet (500 mg total) by mouth daily. 05/22/22   Rodriguez-Southworth, Sunday Spillers, PA-C  omeprazole (PRILOSEC) 20 MG capsule Take 1 capsule (20 mg total) by mouth daily. Patient not taking: Reported on 05/25/2022 05/09/22   Fenton Foy, NP  promethazine-dextromethorphan (PROMETHAZINE-DM) 6.25-15 MG/5ML syrup Take 5 mLs by mouth 4 (four) times daily as needed for cough. 05/19/22   Rudolpho Sevin, NP  tranexamic acid (LYSTEDA) 650 MG TABS tablet Take 2 tablets (1,300 mg total) by mouth 3 (three) times daily. 06/03/22   Small, Si Gaul, PA                                                                                                                                    Past Surgical History Past Surgical History:  Procedure Laterality Date  BREAST SURGERY Left    cyst removed   CESAREAN SECTION     CHOLECYSTECTOMY     KNEE SURGERY Right    tendon repair   LAPAROSCOPIC APPENDECTOMY N/A 10/26/2016   Procedure: APPENDECTOMY LAPAROSCOPIC;  Surgeon: Clovis Riley, MD;  Location: [place];  Service: General;  Laterality: N/A;   OVARY SURGERY Right    benign tumor of right ovary removed, when she was 52 years of age   TONSILLECTOMY Bilateral    Family History Family History  Problem Relation Age of Onset   Hypertension Mother    Diabetes Father     Social History Social History   Tobacco Use   Smoking status: Never   Smokeless tobacco: Never  Vaping Use   Vaping Use: Never used  Substance Use Topics   Alcohol use: No    Alcohol/week: 0.0 standard drinks of alcohol   Drug use: No   Allergies Hydrocodone-acetaminophen  Review of Systems Review of Systems  All other systems reviewed and are negative.   Physical Exam Vital Signs  I have reviewed the triage vital signs BP 122/72   Pulse 70   Temp 98.1 F (36.7 C) (Oral)   Resp 17   Ht '5\' 5"'$  (1.651 m)   Wt 93 kg   LMP  11/13/2019   SpO2 100%   BMI 34.11 kg/m  Physical Exam Vitals and nursing note reviewed.  Constitutional:      General: She is not in acute distress.    Appearance: She is well-developed.  HENT:     Head: Normocephalic and atraumatic.     Mouth/Throat:     Mouth: Mucous membranes are moist.  Eyes:     Pupils: Pupils are equal, round, and reactive to light.  Cardiovascular:     Rate and Rhythm: Normal rate and regular rhythm.     Heart sounds: No murmur heard. Pulmonary:     Effort: Pulmonary effort is normal. No respiratory distress.     Breath sounds: Normal breath sounds.  Abdominal:     General: Abdomen is flat.     Palpations: Abdomen is soft.     Tenderness: There is abdominal tenderness in the left lower quadrant. There is no right CVA tenderness or left CVA tenderness.  Musculoskeletal:        General: No tenderness.     Right lower leg: No edema.     Left lower leg: No edema.  Skin:    General: Skin is warm and dry.  Neurological:     General: No focal deficit present.     Mental Status: She is alert. Mental status is at baseline.  Psychiatric:        Mood and Affect: Mood normal.        Behavior: Behavior normal.     ED Results and Treatments Labs (all labs ordered are listed, but only abnormal results are displayed) Labs Reviewed  COMPREHENSIVE METABOLIC PANEL - Abnormal; Notable for the following components:      Result Value   Potassium 3.4 (*)    Glucose, Bld 105 (*)    Calcium 7.9 (*)    Anion gap 1 (*)    All other components within normal limits  URINALYSIS, ROUTINE W REFLEX MICROSCOPIC - Abnormal; Notable for the following components:   Hgb urine dipstick SMALL (*)    All other components within normal limits  URINALYSIS, MICROSCOPIC (REFLEX) - Abnormal; Notable for the following components:   Bacteria, UA RARE (*)  All other components within normal limits  LIPASE, BLOOD  CBC  PREGNANCY, URINE                                                                                                                           Radiology US Abdomen Complete  Result Date: 06/09/2022 CLINICAL DATA:  Abdominal pain EXAM: ABDOMEN ULTRASOUND COMPLETE COMPARISON:  None Available. FINDINGS: Gallbladder: Surgically absent Common bile duct: Diameter: 8.5 mm Liver: Increased echogenicity. No focal lesion. Portal vein is patent on color Doppler imaging with normal direction of blood flow towards the liver. IVC: No abnormality visualized. Pancreas: Visualized portion unremarkable. Spleen: Size and appearance within normal limits. Right Kidney: Length: 10.7 cm. Echogenicity within normal limits. No mass or hydronephrosis visualized. Left Kidney: Length: 12.5 cm. Echogenicity within normal limits. No mass or hydronephrosis visualized. Abdominal aorta: No aneurysm visualized. Other findings: None. IMPRESSION: 1. Status post cholecystectomy. 2. Increased hepatic parenchymal echogenicity suggestive of steatosis. Electronically Signed   By: Lovey Newcomer M.D.   On: 06/09/2022 19:43    Pertinent labs & imaging results that were available during my care of the patient were reviewed by me and considered in my medical decision making (see MDM for details).  Medications Ordered in ED Medications  ketorolac (TORADOL) 15 MG/ML injection 15 mg (15 mg Intravenous Given 06/09/22 1830)  ondansetron (ZOFRAN) injection 4 mg (4 mg Intravenous Given 06/09/22 1829)                                                                                                                                     Procedures Procedures  (including critical care time)  Medical Decision Making / ED Course   MDM:  51 year old female presenting to the emergency department with left-sided abdominal pain.  Differential includes nephrolithiasis, diverticulitis, abscess, less likely ovarian pathology, recent pelvic ultrasound with normal ovaries without cysts and symptoms atypical for torsion.  Will obtain  ultrasound of the abdomen, CT scan not available.  If ultrasound unable to determine cause such as signs of hydronephrosis to suggest nephrolithiasis likely will need to be transferred to [facility] ER given CT scan is broken. Clinical Course as of 06/09/22 2017  Sat Jun 09, 2022  2015 Labs overall reassuring.  Ultrasound abdomen without clear cause.  Patient continues to have left lower quadrant abdominal pain with tenderness on exam.  CT scan here is broken.  Patient accepted by Dr. Reather Converse to transfer to [facility] by private vehicle for CT scan.  Patient overall very stable.  Normal vital signs.  I believes it is safe to transfer via private vehicle.  Patient will be driven by her husband.  CT scan ordered. [WS]    Clinical Course User Index [WS] Cristie Hem, MD     Additional history obtained: -Additional history obtained from spouse -External records from outside source obtained and reviewed including: Chart review including previous notes, labs, imaging, consultation notes including ED visit 06/03/22   Lab Tests: -I ordered, reviewed, and interpreted labs.   The pertinent results include:   Labs Reviewed  COMPREHENSIVE METABOLIC PANEL - Abnormal; Notable for the following components:      Result Value   Potassium 3.4 (*)    Glucose, Bld 105 (*)    Calcium 7.9 (*)    Anion gap 1 (*)    All other components within normal limits  URINALYSIS, ROUTINE W REFLEX MICROSCOPIC - Abnormal; Notable for the following components:   Hgb urine dipstick SMALL (*)    All other components within normal limits  URINALYSIS, MICROSCOPIC (REFLEX) - Abnormal; Notable for the following components:   Bacteria, UA RARE (*)    All other components within normal limits  LIPASE, BLOOD  CBC  PREGNANCY, URINE    Notable for minimal hypokalemia   Imaging Studies ordered: I ordered imaging studies including US abdomen On my interpretation imaging demonstrates no acute process I independently visualized and  interpreted imaging. I agree with the radiologist interpretation   Medicines ordered and prescription drug management: Meds ordered this encounter  Medications   ketorolac (TORADOL) 15 MG/ML injection 15 mg   ondansetron (ZOFRAN) injection 4 mg    -I have reviewed the patients home medicines and have made adjustments as needed   Cardiac Monitoring: The patient was maintained on a cardiac monitor.  I personally viewed and interpreted the cardiac monitored which showed an underlying rhythm of: NSR  Social Determinants of Health:  Diagnosis or treatment significantly limited by social determinants of health: obesity   Reevaluation: After the interventions noted above, I reevaluated the patient and found that their symptoms have improved  Co morbidities that complicate the patient evaluation  Past Medical History:  Diagnosis Date   Anemia    B12 deficiency    Bronchitis    Gastritis    Vitamin D deficiency       Dispostion: Disposition decision including need for hospitalization was considered, and patient transferred for CT scan given continued pain without clear diagnosis.     Final Clinical Impression(s) / ED Diagnoses Final diagnoses:  Generalized abdominal pain     This chart was dictated using voice recognition software.  Despite best efforts to proofread,  errors can occur which can change the documentation meaning.    Cristie Hem, MD 06/09/22 2018

## 2022-06-11 ENCOUNTER — Telehealth: Payer: Self-pay

## 2022-06-11 NOTE — Transitions of Care (Post Inpatient/ED Visit) (Cosign Needed)
   06/11/2022  Name: Brenda Bell MRN: [id number] DOB: 1971/01/02  Today's TOC FU Call Status: Today's TOC FU Call Status:: Unsuccessul Call (1st Attempt) Unsuccessful Call (1st Attempt) Date: 06/11/22  Attempted to reach the patient regarding the most recent Inpatient/ED visit.  Follow Up Plan: Additional outreach attempts will be made to reach the patient to complete the Transitions of Care (Post Inpatient/ED visit) call.   Signature [place].

## 2022-06-11 NOTE — Transitions of Care (Post Inpatient/ED Visit) (Cosign Needed)
   06/11/2022  Name: ANJOLIE MAJER MRN: [id number] DOB: 08/19/1970  Today's TOC FU Call Status:    Transition Care Management Follow-up Telephone Call Discharge Facility: [facility]) Type of Discharge: Emergency Department How have you been since you were released from the hospital?: Better (Tired) Any questions or concerns?: No  Items Reviewed: Did you receive and understand the discharge instructions provided?: No (unsure) Medications obtained and verified?: Yes (Medications Reviewed) Any new allergies since your discharge?: No Dietary orders reviewed?: No Do you have support at home?: Yes People in Home: spouse Name of Support/Comfort Primary Source: [facility] and Equipment/Supplies: [place] Ordered?: No Any new equipment or medical supplies ordered?: No  Functional Questionnaire: Do you need assistance with bathing/showering or dressing?: No Do you need assistance with meal preparation?: No Do you need assistance with eating?: No Do you have difficulty maintaining continence: No Do you need assistance with getting out of bed/getting out of a chair/moving?: No Do you have difficulty managing or taking your medications?: No  Folllow up appointments reviewed: PCP Follow-up appointment confirmed?: No MD Provider Line Number:[phone] Given: Yes [facility] Follow-up appointment confirmed?: No Do you need transportation to your follow-up appointment?: No Do you understand care options if your condition(s) worsen?: Yes-patient verbalized understanding    SIGNATURE Carlota Raspberry Hernandez-Garcia

## 2022-06-15 ENCOUNTER — Encounter: Payer: Self-pay | Admitting: Nurse Practitioner

## 2022-06-15 ENCOUNTER — Ambulatory Visit (INDEPENDENT_AMBULATORY_CARE_PROVIDER_SITE_OTHER): Payer: PRIVATE HEALTH INSURANCE | Admitting: Nurse Practitioner

## 2022-06-15 VITALS — BP 124/83 | HR 86 | Temp 98.1°F | Wt 188.8 lb

## 2022-06-15 DIAGNOSIS — D219 Benign neoplasm of connective and other soft tissue, unspecified: Secondary | ICD-10-CM

## 2022-06-15 DIAGNOSIS — N939 Abnormal uterine and vaginal bleeding, unspecified: Secondary | ICD-10-CM | POA: Diagnosis not present

## 2022-06-15 DIAGNOSIS — J189 Pneumonia, unspecified organism: Secondary | ICD-10-CM | POA: Diagnosis not present

## 2022-06-15 MED ORDER — BENZONATATE 200 MG PO CAPS: 200.0000 mg | ORAL_CAPSULE | Freq: Two times a day (BID) | ORAL | 0 refills | Status: DC | PRN

## 2022-06-15 NOTE — Progress Notes (Signed)
@Patient  ID: Brenda Bell, female    DOB: January 12, 1971, 52 y.o.   MRN: [id number]  Chief Complaint  Patient presents with   Follow-up    PNE and per pt  she feels better     Referring provider: Ivonne Andrew, NP   HPI  Patient presents today to follow-up on pneumonia.  She states that she is feeling much improved.  She did need a follow-up chest x-ray today to follow-up on resolution.  Patient does have a history of abnormal urine bleeding and fibroids.  She does have an upcoming appointment with OB/GYN for biopsies. Denies f/c/s, n/v/d, hemoptysis, PND, leg swelling Denies chest pain or edema      Allergies  Allergen Reactions   Hydrocodone-Acetaminophen Nausea And Vomiting    Decreased b/p    Immunization History  Administered Date(s) Administered   Influenza Split 01/29/2013   Influenza Whole 01/17/2007   Influenza, Quadrivalent, Recombinant, Inj, Pf 02/02/2019   Influenza,inj,Quad PF,6+ Mos 01/20/2015, 02/09/2016, 02/25/2017, 01/23/2018, 05/09/2022   Influenza-Unspecified 02/14/2021   Pneumococcal Polysaccharide-23 01/17/2007   Td 06/01/2006, 06/18/2006   Tdap 12/25/2012    Past Medical History:  Diagnosis Date   Anemia    B12 deficiency    Bronchitis    Gastritis    Vitamin D deficiency     Tobacco History: Social History   Tobacco Use  Smoking Status Never  Smokeless Tobacco Never   Counseling given: Not Answered   Outpatient Encounter Medications as of 06/15/2022  Medication Sig   albuterol (VENTOLIN HFA) 108 (90 Base) MCG/ACT inhaler Inhale 1-2 puffs into the lungs every 6 (six) hours as needed for wheezing or shortness of breath.   benzonatate (TESSALON) 200 MG capsule Take 1 capsule (200 mg total) by mouth 2 (two) times daily as needed for cough.   cetirizine (ZYRTEC) 10 MG tablet Take 1 tablet (10 mg total) by mouth daily.   omeprazole (PRILOSEC) 20 MG capsule Take 1 capsule (20 mg total) by mouth daily.   levofloxacin (LEVAQUIN) 500 MG  tablet Take 1 tablet (500 mg total) by mouth daily. (Patient not taking: Reported on 06/11/2022)   promethazine-dextromethorphan (PROMETHAZINE-DM) 6.25-15 MG/5ML syrup Take 5 mLs by mouth 4 (four) times daily as needed for cough. (Patient not taking: Reported on 06/11/2022)   tranexamic acid (LYSTEDA) 650 MG TABS tablet Take 2 tablets (1,300 mg total) by mouth 3 (three) times daily. (Patient not taking: Reported on 06/11/2022)   No facility-administered encounter medications on file as of 06/15/2022.     Review of Systems  Review of Systems  Constitutional: Negative.   HENT: Negative.    Cardiovascular: Negative.   Gastrointestinal: Negative.   Allergic/Immunologic: Negative.   Neurological: Negative.   Psychiatric/Behavioral: Negative.         Physical Exam  BP 124/83   Pulse 86   Temp 98.1 F (36.7 C)   Wt 188 lb 12.8 oz (85.6 kg)   LMP 11/13/2019   SpO2 100%   BMI 31.42 kg/m   Wt Readings from Last 5 Encounters:  06/15/22 188 lb 12.8 oz (85.6 kg)  06/09/22 205 lb (93 kg)  06/03/22 205 lb 14.6 oz (93.4 kg)  05/25/22 206 lb (93.4 kg)  05/09/22 205 lb 6.4 oz (93.2 kg)     Physical Exam Vitals and nursing note reviewed.  Constitutional:      General: She is not in acute distress.    Appearance: She is well-developed.  Cardiovascular:     Rate and Rhythm:  Normal rate and regular rhythm.  Pulmonary:     Effort: Pulmonary effort is normal.     Breath sounds: Normal breath sounds.  Neurological:     Mental Status: She is alert and oriented to person, place, and time.      Lab Results:  CBC    Component Value Date/Time   WBC 4.5 06/09/2022 1709   RBC 4.34 06/09/2022 1709   HGB 13.2 06/09/2022 1709   HGB 14.2 11/06/2021 0931   HCT 39.6 06/09/2022 1709   HCT 42.7 11/06/2021 0931   PLT 236 06/09/2022 1709   PLT 192 11/06/2021 0931   MCV 91.2 06/09/2022 1709   MCV 92 11/06/2021 0931   MCH 30.4 06/09/2022 1709   MCHC 33.3 06/09/2022 1709   RDW 13.2  06/09/2022 1709   RDW 13.3 11/06/2021 0931   LYMPHSABS 1.4 06/03/2022 1409   LYMPHSABS 1.7 04/04/2021 0954   MONOABS 0.6 06/03/2022 1409   EOSABS 0.1 06/03/2022 1409   EOSABS 0.1 04/04/2021 0954   BASOSABS 0.0 06/03/2022 1409   BASOSABS 0.0 04/04/2021 0954    BMET    Component Value Date/Time   NA 135 06/09/2022 1709   NA 142 11/06/2021 0931   K 3.4 (L) 06/09/2022 1709   CL 107 06/09/2022 1709   CO2 27 06/09/2022 1709   GLUCOSE 105 (H) 06/09/2022 1709   BUN 8 06/09/2022 1709   BUN 9 11/06/2021 0931   CREATININE 0.59 06/09/2022 1709   CREATININE 0.44 (L) 01/20/2015 1358   CALCIUM 7.9 (L) 06/09/2022 1709   GFRNONAA >60 06/09/2022 1709   GFRAA 135 03/04/2019 1102    BNP No results found for: "BNP"  ProBNP No results found for: "PROBNP"  Imaging: CT ABDOMEN PELVIS W CONTRAST  Result Date: 06/09/2022 CLINICAL DATA:  left lower abd pain EXAM: CT ABDOMEN AND PELVIS WITH CONTRAST TECHNIQUE: Multidetector CT imaging of the abdomen and pelvis was performed using the standard protocol following bolus administration of intravenous contrast. RADIATION DOSE REDUCTION: This exam was performed according to the departmental dose-optimization program which includes automated exposure control, adjustment of the mA and/or kV according to patient size and/or use of iterative reconstruction technique. CONTRAST:  75mL OMNIPAQUE IOHEXOL 350 MG/ML SOLN COMPARISON:  CT abdomen pelvis 10/26/2016 FINDINGS: Lower chest: No acute abnormality. Hepatobiliary: The hepatic parenchyma is diffusely hypodense compared to the splenic parenchyma consistent with fatty infiltration. No focal liver abnormality. Status post cholecystectomy. No biliary dilatation. Pancreas: No focal lesion. Normal pancreatic contour. No surrounding inflammatory changes. No main pancreatic ductal dilatation. Spleen: Normal in size without focal abnormality. Adrenals/Urinary Tract: No adrenal nodule bilaterally. Bilateral kidneys enhance  symmetrically. No hydronephrosis. No hydroureter. The urinary bladder is unremarkable. Stomach/Bowel: Stomach is within normal limits. No evidence of bowel wall thickening or dilatation. Status post appendectomy. Vascular/Lymphatic: No abdominal aorta or iliac aneurysm. No abdominal, pelvic, or inguinal lymphadenopathy. Reproductive: Persistent stable 1.6 cm enhancing round lesion along the endometrium. Uterus and bilateral adnexa are unremarkable. Other: No intraperitoneal free fluid. No intraperitoneal free gas. No organized fluid collection. Musculoskeletal: No abdominal wall hernia or abnormality. No suspicious lytic or blastic osseous lesions. No acute displaced fracture. IMPRESSION: 1. No acute intra-abdominal or intrapelvic abnormality. 2. Stable likely submucosal uterine fibroid versus endometrial polyp. Finding better evaluated ultrasound pelvis 06/03/2022. Electronically Signed   By: Tish Frederickson M.D.   On: 06/09/2022 22:49   US Abdomen Complete  Result Date: 06/09/2022 CLINICAL DATA:  Abdominal pain EXAM: ABDOMEN ULTRASOUND COMPLETE COMPARISON:  None Available. FINDINGS:  Gallbladder: Surgically absent Common bile duct: Diameter: 8.5 mm Liver: Increased echogenicity. No focal lesion. Portal vein is patent on color Doppler imaging with normal direction of blood flow towards the liver. IVC: No abnormality visualized. Pancreas: Visualized portion unremarkable. Spleen: Size and appearance within normal limits. Right Kidney: Length: 10.7 cm. Echogenicity within normal limits. No mass or hydronephrosis visualized. Left Kidney: Length: 12.5 cm. Echogenicity within normal limits. No mass or hydronephrosis visualized. Abdominal aorta: No aneurysm visualized. Other findings: None. IMPRESSION: 1. Status post cholecystectomy. 2. Increased hepatic parenchymal echogenicity suggestive of steatosis. Electronically Signed   By: Annia Belt M.D.   On: 06/09/2022 19:43   US PELVIC COMPLETE WITH TRANSVAGINAL  Result  Date: 06/03/2022 CLINICAL DATA:  Heavy bleeding. Postmenopausal woman. History of fibroid. History of right ovarian tumor removed at age 65. EXAM: TRANSABDOMINAL AND TRANSVAGINAL ULTRASOUND OF PELVIS TECHNIQUE: Both transabdominal and transvaginal ultrasound examinations of the pelvis were performed. Transabdominal technique was performed for global imaging of the pelvis including uterus, ovaries, adnexal regions, and pelvic cul-de-sac. It was necessary to proceed with endovaginal exam following the transabdominal exam to visualize the endometrium and ovaries. COMPARISON:  CT scan of the abdomen and pelvis October 26, 2016. Pelvic ultrasound June 11, 2019. FINDINGS: Uterus Measurements: 9.7 x 4.8 x 7.0 cm = volume: 170 mL. Again noted is a mass in the posterior uterine body measuring 2.0 x 2.0 x 1.8 cm today versus 1.5 x 1.6 x 1.8 cm on the comparison ultrasound from 2021. The mass demonstrates significant vascularity. Endometrium Thickness: 7.2 mm.  No focal abnormality visualized. Right ovary Not visualized.  Possibly surgically absent. Left ovary Measurements: 3.5 x 2.0 x 2.1 cm = volume: 7.8 mL. Normal appearance/no adnexal mass. Other findings No abnormal free fluid. IMPRESSION: 1. The endometrium measures 7.2 mm in thickness in this postmenopausal woman. In the setting of post-menopausal bleeding, endometrial sampling is indicated to exclude carcinoma. If results are benign, sonohysterogram should be considered for focal lesion work-up. (Ref: Radiological Reasoning: Algorithmic Workup of Abnormal Vaginal Bleeding with Endovaginal Sonography and Sonohysterography. AJR 2008; 540:J81-19) 2. The 2 x 2 x 1.8 cm mass in the posterior uterine body is favored to represent a submucosal fibroid. The mass has slowly grown since first identified on CT imaging October 26, 2016 and the mass is hypervascular. Consider sonohysterogram at the time of endometrial biopsy to exclude the less likely possibility this mass is endometrial  in origin. Electronically Signed   By: Gerome Sam III M.D.   On: 06/03/2022 16:39   DG Chest 2 View  Result Date: 05/22/2022 CLINICAL DATA:  Follow-up of bronchitis.  Shortness of breath EXAM: CHEST - 2 VIEW COMPARISON:  08/05/2021 FINDINGS: Midline trachea. Normal heart size and mediastinal contours. No pleural effusion or pneumothorax. Multifocal right-sided airspace opacities, most confluent in the inferior right upper lobe. There may be more subtle left lower lobe airspace disease. IMPRESSION: Right and possible left-sided airspace disease, most consistent with pneumonia. Followup PA and lateral chest X-ray is recommended in 3-4 weeks following trial of antibiotic therapy to ensure resolution and exclude underlying malignancy. These results will be called to the ordering clinician or representative by the Radiologist Assistant, and communication documented in the PACS or [employer]. Electronically Signed   By: Jeronimo Greaves M.D.   On: 05/22/2022 17:28     Assessment & Plan:   Abnormal uterine bleeding 2. Fibroids   3. Pneumonia due to infectious organism, unspecified laterality, unspecified part of lung  - DG  Chest 2 View  Follow up in 3 months     Ivonne Andrew, NP 06/18/2022

## 2022-06-15 NOTE — Patient Instructions (Signed)
1. Abnormal uterine bleeding   2. Fibroids   3. Pneumonia due to infectious organism, unspecified laterality, unspecified part of lung  - DG Chest 2 View  Follow up in 3 months

## 2022-06-18 ENCOUNTER — Encounter: Payer: Self-pay | Admitting: Nurse Practitioner

## 2022-06-18 ENCOUNTER — Emergency Department (HOSPITAL_COMMUNITY)
Admission: EM | Admit: 2022-06-18 | Discharge: 2022-06-18 | Discharge status: Against Medical Advice | Payer: PRIVATE HEALTH INSURANCE | Source: Home / Self Care

## 2022-06-18 ENCOUNTER — Ambulatory Visit (HOSPITAL_COMMUNITY)
Admission: RE | Admit: 2022-06-18 | Discharge: 2022-06-18 | Disposition: A | Discharge status: Home / Self Care | Payer: 59 | Source: Ambulatory Visit | Attending: Nurse Practitioner | Admitting: Nurse Practitioner

## 2022-06-18 DIAGNOSIS — N939 Abnormal uterine and vaginal bleeding, unspecified: Secondary | ICD-10-CM | POA: Insufficient documentation

## 2022-06-18 DIAGNOSIS — J189 Pneumonia, unspecified organism: Secondary | ICD-10-CM | POA: Insufficient documentation

## 2022-06-18 NOTE — Assessment & Plan Note (Signed)
2. Fibroids   3. Pneumonia due to infectious organism, unspecified laterality, unspecified part of lung  - DG Chest 2 View  Follow up in 3 months

## 2022-06-19 ENCOUNTER — Telehealth: Payer: Self-pay

## 2022-06-19 NOTE — Transitions of Care (Post Inpatient/ED Visit) (Cosign Needed)
   06/19/2022  Name: [name] MRN: [id number] DOB: [birth year]  Today's TOC FU Call Status: Today's TOC FU Call Status:: Unsuccessful Call (2nd Attempt) Unsuccessful Call (1st Attempt) Date: 06/19/22  Attempted to reach the patient regarding the most recent Inpatient/ED visit.  Follow Up Plan: Additional outreach attempts will be made to reach the patient to complete the Transitions of Care (Post Inpatient/ED visit) call.   Signature Elyse Jarvis RMA'

## 2022-06-20 ENCOUNTER — Telehealth: Payer: Self-pay

## 2022-06-20 NOTE — Progress Notes (Signed)
Called pt and inform results.Gh 

## 2022-06-20 NOTE — Transitions of Care (Post Inpatient/ED Visit) (Cosign Needed)
   06/20/2022  Name: Brenda Bell MRN: [id number] DOB: [birth year]  Today's TOC FU Call Status: Today's TOC FU Call Status:: Unsuccessful Call (2nd Attempt) Unsuccessful Call (2nd Attempt) Date: 06/20/22  Attempted to reach the patient regarding the most recent Inpatient/ED visit.  Follow Up Plan: Additional outreach attempts will be made to reach the patient to complete the Transitions of Care (Post Inpatient/ED visit) call.   [place] RMA

## 2022-06-20 NOTE — Progress Notes (Signed)
Called pt and left a message for return call. Gh 

## 2022-06-21 ENCOUNTER — Telehealth: Payer: Self-pay

## 2022-06-21 NOTE — Transitions of Care (Post Inpatient/ED Visit) (Cosign Needed)
   06/21/2022  Name: LORENIA CHAINEY MRN: [id number] DOB: 1970-07-24  Today's TOC FU Call Status: Today's TOC FU Call Status:: Unsuccessful Call (3rd Attempt) Unsuccessful Call (3rd Attempt) Date: 06/21/22  Attempted to reach the patient regarding the most recent Inpatient/ED visit.  Follow Up Plan: No further outreach attempts will be made at this time. We have been unable to contact the patient.  [place] RMA

## 2022-06-29 ENCOUNTER — Emergency Department (HOSPITAL_COMMUNITY): Payer: 59

## 2022-06-29 ENCOUNTER — Emergency Department (HOSPITAL_COMMUNITY)
Admission: EM | Admit: 2022-06-29 | Discharge: 2022-06-29 | Disposition: A | Discharge status: Home / Self Care | Payer: 59 | Attending: Emergency Medicine | Admitting: Emergency Medicine

## 2022-06-29 ENCOUNTER — Encounter (HOSPITAL_COMMUNITY): Payer: Self-pay

## 2022-06-29 DIAGNOSIS — R197 Diarrhea, unspecified: Secondary | ICD-10-CM | POA: Diagnosis present

## 2022-06-29 DIAGNOSIS — R1110 Vomiting, unspecified: Secondary | ICD-10-CM | POA: Diagnosis not present

## 2022-06-29 DIAGNOSIS — R1084 Generalized abdominal pain: Secondary | ICD-10-CM | POA: Insufficient documentation

## 2022-06-29 DIAGNOSIS — E876 Hypokalemia: Secondary | ICD-10-CM | POA: Insufficient documentation

## 2022-06-29 LAB — CBC WITH DIFFERENTIAL/PLATELET
Abs Immature Granulocytes: 0.01 10*3/uL (ref 0.00–0.07)
Basophils Absolute: 0 10*3/uL (ref 0.0–0.1)
Basophils Relative: 0 %
Eosinophils Absolute: 0 10*3/uL (ref 0.0–0.5)
Eosinophils Relative: 0 %
HCT: 42.9 % (ref 36.0–46.0)
Hemoglobin: 14 g/dL (ref 12.0–15.0)
Immature Granulocytes: 0 %
Lymphocytes Relative: 11 %
Lymphs Abs: 0.5 10*3/uL — ABNORMAL LOW (ref 0.7–4.0)
MCH: 29.7 pg (ref 26.0–34.0)
MCHC: 32.6 g/dL (ref 30.0–36.0)
MCV: 91.1 fL (ref 80.0–100.0)
Monocytes Absolute: 0.5 10*3/uL (ref 0.1–1.0)
Monocytes Relative: 12 %
Neutro Abs: 3.5 10*3/uL (ref 1.7–7.7)
Neutrophils Relative %: 77 %
Platelets: 207 10*3/uL (ref 150–400)
RBC: 4.71 MIL/uL (ref 3.87–5.11)
RDW: 12.8 % (ref 11.5–15.5)
WBC: 4.6 10*3/uL (ref 4.0–10.5)
nRBC: 0 % (ref 0.0–0.2)

## 2022-06-29 LAB — URINALYSIS, ROUTINE W REFLEX MICROSCOPIC
Bilirubin Urine: NEGATIVE
Glucose, UA: NEGATIVE mg/dL
Hgb urine dipstick: NEGATIVE
Ketones, ur: 20 mg/dL — AB
Leukocytes,Ua: NEGATIVE
Nitrite: NEGATIVE
Protein, ur: NEGATIVE mg/dL
Specific Gravity, Urine: 1.021 (ref 1.005–1.030)
pH: 5 (ref 5.0–8.0)

## 2022-06-29 LAB — GASTROINTESTINAL PANEL BY PCR, STOOL (REPLACES STOOL CULTURE)

## 2022-06-29 LAB — COMPREHENSIVE METABOLIC PANEL
ALT: 49 U/L — ABNORMAL HIGH (ref 0–44)
AST: 44 U/L — ABNORMAL HIGH (ref 15–41)
Albumin: 4.4 g/dL (ref 3.5–5.0)
Alkaline Phosphatase: 83 U/L (ref 38–126)
Anion gap: 9 (ref 5–15)
BUN: 12 mg/dL (ref 6–20)
CO2: 19 mmol/L — ABNORMAL LOW (ref 22–32)
Calcium: 8.4 mg/dL — ABNORMAL LOW (ref 8.9–10.3)
Chloride: 105 mmol/L (ref 98–111)
Creatinine, Ser: 0.46 mg/dL (ref 0.44–1.00)
GFR, Estimated: >60 mL/min (ref >60)
Glucose, Bld: 126 mg/dL — ABNORMAL HIGH (ref 70–99)
Potassium: 3.2 mmol/L — ABNORMAL LOW (ref 3.5–5.1)
Sodium: 133 mmol/L — ABNORMAL LOW (ref 135–145)
Total Bilirubin: 1 mg/dL (ref 0.3–1.2)
Total Protein: 7.6 g/dL (ref 6.5–8.1)

## 2022-06-29 LAB — LIPASE, BLOOD: Lipase: 31 U/L (ref 11–51)

## 2022-06-29 LAB — C DIFFICILE QUICK SCREEN W PCR REFLEX
C Diff antigen: NEGATIVE
C Diff interpretation: NOT DETECTED
C Diff toxin: NEGATIVE

## 2022-06-29 LAB — PREGNANCY, URINE: Preg Test, Ur: NEGATIVE

## 2022-06-29 MED ORDER — LOPERAMIDE HCL 2 MG PO CAPS: 2.0000 mg | ORAL_CAPSULE | Freq: Four times a day (QID) | ORAL | 0 refills | Status: DC | PRN

## 2022-06-29 MED ORDER — POTASSIUM CHLORIDE CRYS ER 20 MEQ PO TBCR
40.0000 meq | EXTENDED_RELEASE_TABLET | Freq: Once | ORAL | Status: AC
  Administered 2022-06-29: 40 meq via ORAL
  Filled 2022-06-29: qty 2

## 2022-06-29 MED ORDER — ONDANSETRON HCL 4 MG/2ML IJ SOLN
4.0000 mg | Freq: Once | INTRAMUSCULAR | Status: AC
  Administered 2022-06-29: 4 mg via INTRAVENOUS
  Filled 2022-06-29: qty 2

## 2022-06-29 MED ORDER — IOHEXOL 300 MG/ML  SOLN
100.0000 mL | Freq: Once | INTRAMUSCULAR | Status: AC | PRN
  Administered 2022-06-29: 100 mL via INTRAVENOUS

## 2022-06-29 MED ORDER — ONDANSETRON 4 MG PO TBDP: 4.0000 mg | ORAL_TABLET | Freq: Three times a day (TID) | ORAL | 0 refills | Status: DC | PRN

## 2022-06-29 MED ORDER — SODIUM CHLORIDE 0.9 % IV BOLUS
1000.0000 mL | Freq: Once | INTRAVENOUS | Status: AC
  Administered 2022-06-29: 1000 mL via INTRAVENOUS

## 2022-06-29 NOTE — Discharge Instructions (Signed)
Your testing is reassuring.  We suspect your diarrhea is likely from a viral infection.  You may use Imodium as needed for diarrhea.  Follow-up with your doctor.  Start with a clear liquid diet today and advance slowly as tolerated.  Return to the ED with worsening pain especially to the right lower abdomen, persistent vomiting, not able to eat or drink, any other concerns.

## 2022-06-29 NOTE — ED Notes (Signed)
Patient given apple juice for PO challenge. ?

## 2022-06-29 NOTE — ED Provider Notes (Signed)
[place] EMERGENCY DEPARTMENT AT [facility] Provider Note   CSN: [id number] Arrival date & time: 06/29/22  [id number]     History  Chief Complaint  Patient presents with   Diarrhea    Brenda Bell is a 52 y.o. female.  Level 5 caveat for language barrier.  Translator is used.  Patient with a history of gastritis, bronchitis, fibroids presenting with abdominal cramps and diarrhea that began around 8 PM.  States multiple episodes of diarrhea overnight happening as often as every 5 minutes.  Multiple episodes within a 20-minute timeframe.  She denies any blood in the stools.  Did have 1 episode of vomiting.  Diffuse crampy abdominal pain but no chest pain or back pain.  No known fever.  Sister sick with similar symptoms but denies being around her recently.  No travel.  Was on antibiotics about 2 months ago for pneumonia and finished them.  Multiple previous abdominal surgery including C-section, cholecystectomy, appendectomy.  No chest pain or shortness of breath.  No history of underlying stomach issues but has had colitis in the past.  Normal colonoscopy in 2017.  The history is provided by the patient and a relative. The history is limited by a language barrier. A language interpreter was used.  Diarrhea Associated symptoms: abdominal pain and vomiting   Associated symptoms: no arthralgias, no fever, no headaches and no myalgias        Home Medications Prior to Admission medications   Medication Sig Start Date End Date Taking? Authorizing Provider  albuterol (VENTOLIN HFA) 108 (90 Base) MCG/ACT inhaler Inhale 1-2 puffs into the lungs every 6 (six) hours as needed for wheezing or shortness of breath. 05/19/22   Rudolpho Sevin, NP  benzonatate (TESSALON) 200 MG capsule Take 1 capsule (200 mg total) by mouth 2 (two) times daily as needed for cough. 06/15/22   Fenton Foy, NP  cetirizine (ZYRTEC) 10 MG tablet Take 1 tablet (10 mg total) by mouth daily. 05/25/22   Fenton Foy, NP  levofloxacin (LEVAQUIN) 500 MG tablet Take 1 tablet (500 mg total) by mouth daily. Patient not taking: Reported on 06/11/2022 05/22/22   Rodriguez-Southworth, Sunday Spillers, PA-C  omeprazole (PRILOSEC) 20 MG capsule Take 1 capsule (20 mg total) by mouth daily. 05/09/22   Fenton Foy, NP  promethazine-dextromethorphan (PROMETHAZINE-DM) 6.25-15 MG/5ML syrup Take 5 mLs by mouth 4 (four) times daily as needed for cough. Patient not taking: Reported on 06/11/2022 05/19/22   Rudolpho Sevin, NP  tranexamic acid (LYSTEDA) 650 MG TABS tablet Take 2 tablets (1,300 mg total) by mouth 3 (three) times daily. Patient not taking: Reported on 06/11/2022 06/03/22   Small, Brooke L, PA      Allergies    Hydrocodone-acetaminophen    Review of Systems   Review of Systems  Constitutional:  Positive for activity change and appetite change. Negative for fever.  HENT:  Negative for congestion and rhinorrhea.   Respiratory:  Negative for cough, chest tightness and shortness of breath.   Gastrointestinal:  Positive for abdominal pain, diarrhea and vomiting.  Genitourinary:  Negative for dysuria and hematuria.  Musculoskeletal:  Negative for arthralgias and myalgias.  Skin:  Negative for rash.  Neurological:  Positive for weakness. Negative for dizziness and headaches.   all other systems are negative except as noted in the HPI and PMH.    Physical Exam Updated Vital Signs BP 123/79 (BP Location: Right Arm)   Pulse 96   Temp 98.9 F (  37.2 C) (Oral)   Resp 18   Ht 5\' 5"  (1.651 m)   Wt 85.3 kg   LMP 06/01/2022   SpO2 100%   BMI 31.28 kg/m  Physical Exam Vitals and nursing note reviewed.  Constitutional:      General: She is not in acute distress.    Appearance: She is well-developed.  HENT:     Head: Normocephalic and atraumatic.     Mouth/Throat:     Pharynx: No oropharyngeal exudate.  Eyes:     Conjunctiva/sclera: Conjunctivae normal.     Pupils: Pupils are equal, round, and reactive to  light.  Neck:     Comments: No meningismus. Cardiovascular:     Rate and Rhythm: Normal rate and regular rhythm.     Heart sounds: Normal heart sounds. No murmur heard. Pulmonary:     Effort: Pulmonary effort is normal. No respiratory distress.     Breath sounds: Normal breath sounds.  Abdominal:     Palpations: Abdomen is soft.     Tenderness: There is abdominal tenderness. There is no guarding or rebound.     Comments: Diffuse tenderness, no guarding or rebound  Musculoskeletal:        General: No tenderness. Normal range of motion.     Cervical back: Normal range of motion and neck supple.  Skin:    General: Skin is warm.  Neurological:     Mental Status: She is alert and oriented to person, place, and time.     Cranial Nerves: No cranial nerve deficit.     Motor: No abnormal muscle tone.     Coordination: Coordination normal.     Comments:  5/5 strength throughout. CN 2-12 intact.Equal grip strength.   Psychiatric:        Behavior: Behavior normal.     ED Results / Procedures / Treatments   Labs (all labs ordered are listed, but only abnormal results are displayed) Labs Reviewed  COMPREHENSIVE METABOLIC PANEL - Abnormal; Notable for the following components:      Result Value   Sodium 133 (*)    Potassium 3.2 (*)    CO2 19 (*)    Glucose, Bld 126 (*)    Calcium 8.4 (*)    AST 44 (*)    ALT 49 (*)    All other components within normal limits  URINALYSIS, ROUTINE W REFLEX MICROSCOPIC - Abnormal; Notable for the following components:   Ketones, ur 20 (*)    All other components within normal limits  CBC WITH DIFFERENTIAL/PLATELET - Abnormal; Notable for the following components:   Lymphs Abs 0.5 (*)    All other components within normal limits  C DIFFICILE QUICK SCREEN W PCR REFLEX    GASTROINTESTINAL PANEL BY PCR, STOOL (REPLACES STOOL CULTURE)  LIPASE, BLOOD  PREGNANCY, URINE    EKG None  Radiology CT ABDOMEN PELVIS W CONTRAST  Result Date:  06/29/2022 CLINICAL DATA:  Abdominal pain, acute, nonlocalized. Diarrhea since last night. EXAM: CT ABDOMEN AND PELVIS WITH CONTRAST TECHNIQUE: Multidetector CT imaging of the abdomen and pelvis was performed using the standard protocol following bolus administration of intravenous contrast. RADIATION DOSE REDUCTION: This exam was performed according to the departmental dose-optimization program which includes automated exposure control, adjustment of the mA and/or kV according to patient size and/or use of iterative reconstruction technique. CONTRAST:  163mL OMNIPAQUE IOHEXOL 300 MG/ML  SOLN COMPARISON:  Abdominopelvic CT 06/09/2022 and 10/26/2016. FINDINGS: Lower chest: No acute findings. Linear scarring at both lung bases,  similar to previous study. No significant pleural or pericardial effusion. Hepatobiliary: The liver is normal in density without suspicious focal abnormality. No evidence of biliary dilatation status post cholecystectomy. Pancreas: Unremarkable. No pancreatic ductal dilatation or surrounding inflammatory changes. Spleen: Normal in size without focal abnormality. Adrenals/Urinary Tract: Both adrenal glands appear normal. No evidence of urinary tract calculus, suspicious renal lesion or hydronephrosis. The bladder appears unremarkable for its degree of distention. Stomach/Bowel: No enteric contrast administered. The stomach appears unremarkable for its degree of distension. No evidence of bowel wall thickening, distention or surrounding inflammatory change. Postsurgical changes at the cecal tip consistent with prior appendectomy. Vascular/Lymphatic: There are no enlarged abdominal or pelvic lymph nodes. Mildly prominent inguinal lymph nodes bilaterally are unchanged, likely reactive. No significant vascular findings. Circumaortic left renal vein noted incidentally. Reproductive: Submucosal nodularity within the uterus, similar to previous study and felt to reflect a submucosal fibroid on  ultrasound. No suspicious adnexal findings. Other: Postsurgical changes in the anterior abdominal wall. No evidence of abdominal wall hernia, ascites or free intraperitoneal air. Musculoskeletal: No acute or significant osseous findings. IMPRESSION: 1. No acute findings or explanation for the patient's symptoms. 2. Postsurgical changes as described. 3. Stable appearance of the uterus with findings consistent with a submucosal fibroid. Electronically Signed   By: Richardean Sale M.D.   On: 06/29/2022 12:12    Procedures Procedures    Medications Ordered in ED Medications  sodium chloride 0.9 % bolus 1,000 mL (has no administration in time range)  ondansetron (ZOFRAN) injection 4 mg (has no administration in time range)    ED Course/ Medical Decision Making/ A&P                             Medical Decision Making Amount and/or Complexity of Data Reviewed Labs: ordered. Decision-making details documented in ED Course. Radiology: ordered and independent interpretation performed. Decision-making details documented in ED Course. ECG/medicine tests: ordered and independent interpretation performed. Decision-making details documented in ED Course.  Risk Prescription drug management.   12 hours of abdominal cramps, diarrhea and vomiting.  Vital stable, no distress, abdomen soft without peritoneal signs.  Will hydrate, check labs.  Suspect likely viral gastroenteritis but has been on antibiotics about 2 months ago.  Will attempt to obtain stool studies to rule out C. difficile.  Labs show mild hypokalemia.  This is replaced.  C. difficile testing is negative.  On recheck, patient is feeling improved, abdomen soft without peritoneal signs.  CT scan as above shows no acute findings.  No evidence colitis or bowel obstruction.  Results reviewed and interpreted by me.  Suspect likely viral gastroenteritis versus foodborne illness. Recommend supportive care at home, antidiarrheals, clear liquid  diet, advance slowly as tolerated.  Return to the ED with worsening abdominal pain, especially to the right lower quadrant, vomiting, fever, unable to eat or drink, or any other concerns.       Final Clinical Impression(s) / ED Diagnoses Final diagnoses:  Diarrhea of presumed infectious origin    Rx / DC Orders ED Discharge Orders     None         Mohamedamin Nifong, Annie Main, MD 06/29/22 1702

## 2022-06-29 NOTE — ED Notes (Signed)
Pt denies nausea, vomiting after PO challenge. Pt tolerates PO challenge

## 2022-06-29 NOTE — ED Notes (Signed)
Pt tried to use the bathroom but was unable to provide a urine specimen

## 2022-06-29 NOTE — ED Triage Notes (Signed)
Pt c/o diarrhea since last night. Pt states multiple episodes of diarrhea overnight- 4-5x within twenty minutes. Pt had on episode of emesis. Denies abd pain.

## 2022-06-29 NOTE — ED Notes (Signed)
Pt ambulatory to bathroom without assistance.

## 2022-07-02 ENCOUNTER — Telehealth: Payer: Self-pay

## 2022-07-02 NOTE — Transitions of Care (Post Inpatient/ED Visit) (Cosign Needed)
   07/02/2022  Name: Brenda Bell MRN: [id number] DOB: [birth year]  Today's TOC FU Call Status: Today's TOC FU Call Status:: Unsuccessul Call (1st Attempt) Unsuccessful Call (1st Attempt) Date: 07/02/22  Attempted to reach the patient regarding the most recent Inpatient/ED visit.  Follow Up Plan: Additional outreach attempts will be made to reach the patient to complete the Transitions of Care (Post Inpatient/ED visit) call.   Signature Gh

## 2022-07-03 ENCOUNTER — Telehealth: Payer: Self-pay

## 2022-07-03 NOTE — Transitions of Care (Post Inpatient/ED Visit) (Cosign Needed)
   07/03/2022  Name: Brenda Bell MRN: [id number] DOB: Jan 01, 1971  Today's TOC FU Call Status: Today's TOC FU Call Status:: Unsuccessful Call (2nd Attempt) Unsuccessful Call (2nd Attempt) Date: 07/03/22  Attempted to reach the patient regarding the most recent Inpatient/ED visit.  Follow Up Plan: Additional outreach attempts will be made to reach the patient to complete the Transitions of Care (Post Inpatient/ED visit) call.   Signature Gh.

## 2022-07-04 ENCOUNTER — Telehealth: Payer: Self-pay

## 2022-07-04 NOTE — Transitions of Care (Post Inpatient/ED Visit) (Cosign Needed)
   07/04/2022  Name: Brenda Bell MRN: [id number] DOB: 1970-05-26  Today's TOC FU Call Status: Today's TOC FU Call Status:: Unsuccessful Call (3rd Attempt) Unsuccessful Call (3rd Attempt) Date: 07/04/22  Attempted to reach the patient regarding the most recent Inpatient/ED visit.  Follow Up Plan: No further outreach attempts will be made at this time. We have been unable to contact the patient.  Signature Gh.

## 2022-07-10 ENCOUNTER — Other Ambulatory Visit (HOSPITAL_COMMUNITY)
Admission: RE | Admit: 2022-07-10 | Discharge: 2022-07-10 | Disposition: A | Discharge status: Home / Self Care | Payer: 59 | Source: Ambulatory Visit | Attending: Family Medicine | Admitting: Family Medicine

## 2022-07-10 ENCOUNTER — Encounter: Payer: Self-pay | Admitting: Family Medicine

## 2022-07-10 ENCOUNTER — Other Ambulatory Visit: Payer: Self-pay

## 2022-07-10 ENCOUNTER — Ambulatory Visit (INDEPENDENT_AMBULATORY_CARE_PROVIDER_SITE_OTHER): Payer: 59 | Admitting: Family Medicine

## 2022-07-10 VITALS — BP 127/86 | HR 108 | Ht 65.0 in | Wt 201.6 lb

## 2022-07-10 DIAGNOSIS — N950 Postmenopausal bleeding: Secondary | ICD-10-CM

## 2022-07-10 DIAGNOSIS — Z124 Encounter for screening for malignant neoplasm of cervix: Secondary | ICD-10-CM

## 2022-07-10 NOTE — Progress Notes (Signed)
    GYNECOLOGY CLINIC ENDOMETRIAL BIOPSY PROCEDURE NOTE  Brenda Bell is a y 52 y.o. [phone] here for endometrial biopsy for abnormal uterine bleeding.  Discussed nature of the procedure and risks and benefits.  Pregnancy test:  Lab Results  Component Value Date   PREGTESTUR NEGATIVE 06/29/2022    Allergies  Allergen Reactions   Hydrocodone-Acetaminophen Nausea And Vomiting    Decreased b/p    Patient given informed consent, signed copy in the chart, time out was performed.    The patient was placed in the lithotomy position and the cervix brought into view with sterile speculum. A pap smear was collected.  Cervix cleansed x 2 with betadine swabs. The uterus was sounded for depth of 5 cm. A pipelle was introduced to into the uterus, suction created,  and an endometrial sample was obtained. All equipment was removed and accounted for.  The patient tolerated the procedure well.   Patient given post procedure instructions.  Per patient preference she will be notified of results by telephone.  Venora Maples, MD/MPH Attending Family Medicine Physician, [facility] for [facility], [facility]

## 2022-07-12 ENCOUNTER — Telehealth: Payer: Self-pay | Admitting: Family Medicine

## 2022-07-12 LAB — SURGICAL PATHOLOGY

## 2022-07-12 NOTE — Telephone Encounter (Signed)
Attempted to call patient, no answer, left a voicemail stating I was trying to get in touch with her about results and that we would try again later.   Clinical staff please call patient and let her know that unfortunately we did not get a good biopsy of her endometrium, just her cervical canal. This likely happened because she has a fibroid that is blocking her uterine cavity right at the top of her cervix.   I would recommend she have a hysteroscopy so that we can obtain an adequate sample with good pain control. If she is willing to have this procedure done then please send me a message so that I can coordinate this.

## 2022-07-13 NOTE — Telephone Encounter (Signed)
Called pt with [employer] and informed pt that the provider was not able to get a good sample during her endo bx as a possible fibroid was blocking her uterine cavity.  I advised her the provider is recommending a procedure that is done in the OR if she desires.  Pt states that she is interested in the procedure.  I advised pt that I would let Dr. Crissie Reese know so that he coordinate everything and get back in touch with her.  Pt verbalized understanding with no further questions.   Leonette Nutting  07/13/22

## 2022-07-17 LAB — CYTOLOGY - PAP
Comment: NEGATIVE
Diagnosis: NEGATIVE
High risk HPV: NEGATIVE

## 2022-07-30 ENCOUNTER — Other Ambulatory Visit (HOSPITAL_COMMUNITY)
Admission: RE | Admit: 2022-07-30 | Discharge: 2022-07-30 | Disposition: A | Discharge status: Home / Self Care | Payer: 59 | Source: Ambulatory Visit | Attending: Obstetrics and Gynecology | Admitting: Obstetrics and Gynecology

## 2022-07-30 ENCOUNTER — Encounter: Payer: Self-pay | Admitting: Family Medicine

## 2022-07-30 ENCOUNTER — Other Ambulatory Visit: Payer: Self-pay

## 2022-07-30 ENCOUNTER — Encounter: Payer: Self-pay | Admitting: Obstetrics and Gynecology

## 2022-07-30 ENCOUNTER — Ambulatory Visit (INDEPENDENT_AMBULATORY_CARE_PROVIDER_SITE_OTHER): Payer: 59 | Admitting: Obstetrics and Gynecology

## 2022-07-30 VITALS — BP 133/86 | HR 75 | Ht 65.0 in | Wt 205.7 lb

## 2022-07-30 DIAGNOSIS — N882 Stricture and stenosis of cervix uteri: Secondary | ICD-10-CM | POA: Insufficient documentation

## 2022-07-30 DIAGNOSIS — N939 Abnormal uterine and vaginal bleeding, unspecified: Secondary | ICD-10-CM | POA: Insufficient documentation

## 2022-07-30 DIAGNOSIS — Z758 Other problems related to medical facilities and other health care: Secondary | ICD-10-CM

## 2022-07-30 DIAGNOSIS — Z603 Acculturation difficulty: Secondary | ICD-10-CM | POA: Diagnosis not present

## 2022-07-30 DIAGNOSIS — R7989 Other specified abnormal findings of blood chemistry: Secondary | ICD-10-CM

## 2022-07-30 DIAGNOSIS — N950 Postmenopausal bleeding: Secondary | ICD-10-CM

## 2022-07-30 DIAGNOSIS — Z01818 Encounter for other preprocedural examination: Secondary | ICD-10-CM

## 2022-07-30 HISTORY — PX: ENDOMETRIAL BIOPSY: PRO73

## 2022-07-30 HISTORY — DX: Stricture and stenosis of cervix uteri: N88.2

## 2022-07-30 NOTE — Progress Notes (Unsigned)
error 

## 2022-07-30 NOTE — Progress Notes (Unsigned)
Obstetrics and Gynecology New Patient Evaluation  Appointment Date: 07/30/2022  OBGYN Clinic: [facility]  Primary Care Provider: Ivonne Andrew  Referring Provider: Dr. Crissie Reese  Chief Complaint: postmenopausal bleeding  History of Present Illness: Brenda Bell is a 52 y.o. Hispanic [id number], seen for the above chief complaint. 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 Crissie Reese on 4/9 for AUB for 3/3 ED follow up. On 3/3, she had VB that started a few days prior. Quant neg, with cbc, cmp, wet prep, gc/ct also negative. U/s showed ES of 7.14mm that was normal with a possible 2cm posterior submucosal fibroid seen. GYN consulted and recommended Lysteda and outpatient follow up.   With Dr. Crissie Reese, she had a negative pap and hpv testing and an embx dont but sampling came back only for benign endocervical cells; he was only able to sound her to 5cm.   He asked me to take her to the OR, but I wanted to see her in the office first and try and embx before taking her to the OR.   No VB since when she was in the ED in March, but she did note some VB for awhile after the embx attempt. She states her last period before March was over a year prior.   Review of Systems: Pertinent items noted in HPI and remainder of comprehensive ROS otherwise negative.   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 Janee Morn   COLONOSCOPY WITH ESOPHAGOGASTRODUODENOSCOPY (EGD)  06/06/2015   dr pyrtle   EXCISION OF BREAST BIOPSY Left 1998   benign   KNEE ARTHROSCOPY Right 05/25/2003   @[facility]  by dr Charlann Boxer   KNEE ARTHROSCOPY W/ SYNOVECTOMY Right 08/20/2003   @[facility]  by dr Charlann Boxer;   manipulation under anesthesia and lysis adhesions   LAPAROSCOPIC APPENDECTOMY N/A 10/26/2016   Procedure: APPENDECTOMY LAPAROSCOPIC;  Surgeon: Berna Bue, MD;  Location: [facility] OR;  Service: General;  Laterality: N/A;   OVARY SURGERY Right    age 55;    benign tumor of right ovary removed   TONSILLECTOMY Bilateral 1995    Past Obstetrical History:  OB History  Gravida Para Term Preterm AB Living  3 3 3     3   SAB IAB Ectopic Multiple Live Births               #  Outcome Date GA Lbr Len/2nd Weight Sex Delivery Anes PTL Lv  3 Term 03/12/96    M CS-Unspec     2 Term 07/11/92    M CS-Unspec     1 Term 02/24/90    M CS-Unspec       Past Gynecological History: As per HPI.  Social History:  Social History   Socioeconomic History   Marital status: Married    Spouse name: Not on file   Number of children: Not on file   Years of education: Not on file   Highest education level: Not on file  Occupational History   Not on file  Tobacco Use   Smoking status: Never   Smokeless tobacco: Never  Vaping Use   Vaping Use: Never used  Substance and Sexual Activity   Alcohol use: No    Alcohol/week: 0.0 standard drinks of alcohol   Drug use: No   Sexual activity: Yes  Other Topics Concern   Not  on file  Social History Narrative   Not on file   Social Determinants of Health   Financial Resource Strain: Not on file  Food Insecurity: No Food Insecurity (07/10/2022)   Hunger Vital Sign    Worried About Running Out of Food in the Last Year: Never true    Ran Out of Food in the Last Year: Never true  Transportation Needs: No Transportation Needs (07/10/2022)   PRAPARE - Administrator, Civil Service (Medical): No    Lack of Transportation (Non-Medical): No  Physical Activity: Not on file  Stress: Not on file  Social Connections: Not on file  Intimate Partner Violence: Not on file    Family History:  Family History  Problem Relation Age of Onset   Hypertension Mother    Diabetes Father     Medications Ranata A. Seidman had no medications administered during this visit. Current Outpatient Medications  Medication Sig Dispense Refill   cetirizine (ZYRTEC) 10 MG tablet Take 1 tablet (10 mg total) by mouth daily. 30 tablet 11   albuterol (VENTOLIN HFA) 108 (90 Base) MCG/ACT inhaler Inhale 1-2 puffs into the lungs every 6 (six) hours as needed for wheezing or shortness of breath. (Patient not taking: Reported on 07/10/2022) 18 g 0   omeprazole (PRILOSEC) 20 MG capsule Take 1 capsule (20 mg total) by mouth daily. (Patient not taking: Reported on 07/30/2022) 30 capsule 3   ondansetron (ZOFRAN-ODT) 4 MG disintegrating tablet Take 1 tablet (4 mg total) by mouth every 8 (eight) hours as needed for nausea or vomiting. (Patient not taking: Reported on 07/30/2022) 20 tablet 0   tranexamic acid (LYSTEDA) 650 MG TABS tablet Take 2 tablets (1,300 mg total) by mouth 3 (three) times daily. (Patient not taking: Reported on 06/11/2022) 30 tablet 0   No current facility-administered medications for this visit.    Allergies Hydrocodone-acetaminophen   Physical Exam:  BP 133/86   Pulse 75   Ht 5\' 5"  (1.651 m)   Wt 205 lb 11.2 oz (93.3 kg)   BMI 34.23 kg/m  Body mass index is 34.23  kg/m. General appearance: Well nourished, well developed female in no acute distress.  Neck:  Supple, normal appearance, and no thyromegaly  Cardiovascular: normal s1 and s2.  No murmurs, rubs or gallops. Respiratory:  Clear to auscultation bilateral. Normal respiratory effort Abdomen: positive bowel sounds and no masses, hernias; diffusely non tender to palpation, non distended Neuro/Psych:  Normal mood and affect.  Skin:  Warm and  dry.  Lymphatic:  No inguinal lymphadenopathy.   Cervical exam performed in the presence of a chaperone Pelvic exam: is limited by body habitus EGBUS: within normal limits Vagina: within normal limits and with no blood or discharge in the vault Cervix: normal appearing cervix without tenderness, discharge or lesions. Uterus:  nonenlarged and non tender Adnexa:  normal adnexa and no mass, fullness, tenderness Rectovaginal: deferred  See procedure note embx. Cervical stenosis noted  Laboratory: as per HPI  Radiology:  Ultrasound images reviewed  Narrative & Impression  CLINICAL DATA:  Heavy bleeding. Postmenopausal woman. History of fibroid. History of right ovarian tumor removed at age 68.   EXAM: TRANSABDOMINAL AND TRANSVAGINAL ULTRASOUND OF PELVIS   TECHNIQUE: Both transabdominal and transvaginal ultrasound examinations of the pelvis were performed. Transabdominal technique was performed for global imaging of the pelvis including uterus, ovaries, adnexal regions, and pelvic cul-de-sac. It was necessary to proceed with endovaginal exam following the transabdominal exam to visualize the endometrium and ovaries.   COMPARISON:  CT scan of the abdomen and pelvis October 26, 2016. Pelvic ultrasound June 11, 2019.   FINDINGS: Uterus   Measurements: 9.7 x 4.8 x 7.0 cm = volume: 170 mL. Again noted is a mass in the posterior uterine body measuring 2.0 x 2.0 x 1.8 cm today versus 1.5 x 1.6 x 1.8 cm on the comparison ultrasound from 2021. The mass  demonstrates significant vascularity.   Endometrium   Thickness: 7.2 mm.  No focal abnormality visualized.   Right ovary   Not visualized.  Possibly surgically absent.   Left ovary   Measurements: 3.5 x 2.0 x 2.1 cm = volume: 7.8 mL. Normal appearance/no adnexal mass.   Other findings   No abnormal free fluid.   IMPRESSION: 1. The endometrium measures 7.2 mm in thickness in this postmenopausal woman. In the setting of post-menopausal bleeding, endometrial sampling is indicated to exclude carcinoma. If results are benign, sonohysterogram should be considered for focal lesion work-up. (Ref: Radiological Reasoning: Algorithmic Workup of Abnormal Vaginal Bleeding with Endovaginal Sonography and Sonohysterography. AJR 2008; 161:W96-04) 2. The 2 x 2 x 1.8 cm mass in the posterior uterine body is favored to represent a submucosal fibroid. The mass has slowly grown since first identified on CT imaging October 26, 2016 and the mass is hypervascular. Consider sonohysterogram at the time of endometrial biopsy to exclude the less likely possibility this mass is endometrial in origin.     Electronically Signed   By: Gerome Sam III M.D.   On: 06/03/2022 16:39   Narrative & Impression  CLINICAL DATA:  Abdominal pain, acute, nonlocalized. Diarrhea since last night.   EXAM: CT ABDOMEN AND PELVIS WITH CONTRAST   TECHNIQUE: Multidetector CT imaging of the abdomen and pelvis was performed using the standard protocol following bolus administration of intravenous contrast.   RADIATION DOSE REDUCTION: This exam was performed according to the departmental dose-optimization program which includes automated exposure control, adjustment of the mA and/or kV according to patient size and/or use of iterative reconstruction technique.   CONTRAST:  OMNIPAQUE IOHEXOL 300 MG/ML  SOLN   COMPARISON:  Abdominopelvic CT 06/09/2022 and 10/26/2016.   FINDINGS: Lower chest: No acute  findings. Linear scarring at both lung bases, similar to previous study. No significant pleural or pericardial effusion.   Hepatobiliary: The liver is normal in density without suspicious focal abnormality. No evidence of biliary dilatation status post cholecystectomy.   Pancreas: Unremarkable. No pancreatic ductal dilatation or surrounding inflammatory changes.  Spleen: Normal in size without focal abnormality.   Adrenals/Urinary Tract: Both adrenal glands appear normal. No evidence of urinary tract calculus, suspicious renal lesion or hydronephrosis. The bladder appears unremarkable for its degree of distention.   Stomach/Bowel: No enteric contrast administered. The stomach appears unremarkable for its degree of distension. No evidence of bowel wall thickening, distention or surrounding inflammatory change. Postsurgical changes at the cecal tip consistent with prior appendectomy.   Vascular/Lymphatic: There are no enlarged abdominal or pelvic lymph nodes. Mildly prominent inguinal lymph nodes bilaterally are unchanged, likely reactive. No significant vascular findings. Circumaortic left renal vein noted incidentally.   Reproductive: Submucosal nodularity within the uterus, similar to previous study and felt to reflect a submucosal fibroid on ultrasound. No suspicious adnexal findings.   Other: Postsurgical changes in the anterior abdominal wall. No evidence of abdominal wall hernia, ascites or free intraperitoneal air.   Musculoskeletal: No acute or significant osseous findings.   IMPRESSION: 1. No acute findings or explanation for the patient's symptoms. 2. Postsurgical changes as described. 3. Stable appearance of the uterus with findings consistent with a submucosal fibroid.     Electronically Signed   By: Carey Bullocks M.D.   On: 06/29/2022 12:12   Assessment: patient  Plan:  1. Postmenopausal bleeding I don't see a TSH in the system so will check this.  No obvious atrophy on exam and pt denies any hot flashes, night sweats, vaginal dryness s/s. Will check TSH, estradiol and fsh with pre op labs. I told her that if embx is negative, it's still reasonable to proceed to the OR for full evaluation and possible fibroid removal. The risks of surgery were discussed in detail with the patient including but not limited to: bleeding which may require transfusion or reoperation; infection which may require prolonged hospitalization or re-hospitalization and antibiotic therapy; injury to bowel, bladder, ureters and major vessels or other surrounding organs which may lead to other procedures; formation of adhesions; need for additional procedures including laparotomy or subsequent procedures secondary to intraoperative injury or abnormal pathology; thromboembolic phenomenon; incisional problems and other postoperative or anesthesia complications.  Patient was told that the likelihood that her condition and symptoms will be treated effectively with this surgical management was very high; the postoperative expectations were also discussed in detail. The patient also understands the alternative treatment options which were discussed in full. All questions were answered, and patient would like to proceed - Surgical pathology( [place]/ [facility])  2. Cervical stenosis (uterine cervix)  3. Language barrier In person interperter used  4. History of raised TSH See above    RTC post op  Return if symptoms worsen or fail to improve.  Future Appointments  Date Time Provider Department Center  09/17/2022  3:00 PM Ivonne Andrew, NP [facility] None    Cornelia Copa MD Attending Center for [facility])

## 2022-07-30 NOTE — Procedures (Signed)
Endometrial Biopsy Procedure Note  Pre-operative Diagnosis: PMB. Cervical stenosis  Post-operative Diagnosis: same  Procedure Details  Cervical exam performed in the presence of a chaperone Urine pregnancy test was not done.  The risks (including infection, bleeding, pain, and uterine perforation) and benefits of the procedure were explained to the patient and Written informed consent was obtained.  The patient was placed in the dorsal lithotomy position.  Bimanual exam showed the uterus to be in the neutral position.  A Graves' speculum inserted in the vagina, and the cervix was visualized. The cervix was then prepped with povidone iodine, and a sharp tenaculum was applied to the anterior lip of the cervix for stabilization.  A pipelle was inserted into the uterine cavity, but she only sounded to 5cm like with Dr. Crissie Reese. Using yellow os finders, with moderate difficulty, I was able to open up the internal os. A pipelle then sounded to 9.5, and a moderate amount of tissue was collected after 2 passes. The sample was sent for pathologic examination.  Condition: Stable  Complications: None  Plan: The patient was advised to call for any fever or for prolonged or severe pain or bleeding. She was advised to use OTC ibuprofen as needed for mild to moderate pain. She was advised to avoid vaginal intercourse for 48 hours or until the bleeding has completely stopped.  Cornelia Copa MD Attending Center for [employer] [facility])

## 2022-08-01 ENCOUNTER — Encounter (HOSPITAL_BASED_OUTPATIENT_CLINIC_OR_DEPARTMENT_OTHER): Payer: Self-pay | Admitting: Obstetrics and Gynecology

## 2022-08-01 DIAGNOSIS — Z603 Acculturation difficulty: Secondary | ICD-10-CM | POA: Insufficient documentation

## 2022-08-01 LAB — SURGICAL PATHOLOGY

## 2022-08-02 ENCOUNTER — Encounter (HOSPITAL_BASED_OUTPATIENT_CLINIC_OR_DEPARTMENT_OTHER): Payer: Self-pay | Admitting: Obstetrics and Gynecology

## 2022-08-02 NOTE — H&P (Signed)
Obstetrics and Gynecology H&P  Appointment Date: 07/30/2022  OBGYN Clinic: [facility]  Primary Care Provider: Ivonne Bell  Referring Provider: Dr. Crissie Bell  Chief Complaint: postmenopausal bleeding  History of Present Illness: Brenda Bell is a 52 y.o. Hispanic [phone], seen for the above chief complaint. 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 Crissie Bell on 4/9 for AUB for 3/3 ED follow up. On 3/3, she had VB that started a few days prior. Quant neg, with cbc, cmp, wet prep, gc/ct also negative. U/s showed ES of 7.76mm that was normal with a possible 2cm posterior submucosal fibroid seen. GYN consulted and recommended Lysteda and outpatient follow up.   With Brenda. Crissie Bell, she had a negative pap and hpv testing and an embx dont but sampling came back only for benign endocervical cells; he was only able to sound her to 5cm.   He asked me to take her to the OR, but I wanted to see her in the office first and try and embx before taking her to the OR.   No VB since when she was in the ED in March, but she did note some VB for awhile after the embx attempt. She states her last period before March was over a year prior.   Review of Systems: Pertinent items noted in HPI and remainder of comprehensive ROS otherwise negative.   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    Past Medical History:   Past Medical History:  Diagnosis Date   Anemia, iron deficiency 08/20/2013   Asthma    B12 deficiency    History of gastritis 2017   PMB (postmenopausal bleeding)    Stenosis of cervix    Uterine fibroid    Vitamin D deficiency     Past Surgical History:  Past Surgical History:  Procedure Laterality Date   CESAREAN SECTION     x3  last one 1997   CHOLECYSTECTOMY, LAPAROSCOPIC  10/18/2006   @[facility]  by Brenda Bell   COLONOSCOPY WITH ESOPHAGOGASTRODUODENOSCOPY (EGD)  06/06/2015   Brenda Bell   EXCISION OF BREAST BIOPSY Left 1998   benign   KNEE ARTHROSCOPY Right 05/25/2003   @[facility]  by Brenda Bell   KNEE ARTHROSCOPY W/ SYNOVECTOMY Right 08/20/2003   @[facility]  by Brenda Bell;   manipulation under anesthesia and lysis adhesions   LAPAROSCOPIC APPENDECTOMY N/A 10/26/2016   Procedure: APPENDECTOMY LAPAROSCOPIC;  Surgeon: Brenda Bue, MD;  Location: [facility] OR;  Service: General;  Laterality: N/A;   OVARY SURGERY Right    age 52;    benign tumor of right ovary removed   TONSILLECTOMY Bilateral 1995    Past Obstetrical History:  OB History  Gravida Para Term Preterm AB Living  3 3 3     3   SAB IAB Ectopic Multiple Live Births               #  Outcome Date GA Lbr Len/2nd Weight Sex Delivery Anes PTL Lv  3 Term 03/12/96    M CS-Unspec     2 Term 07/11/92    M CS-Unspec     1 Term 02/24/90    M CS-Unspec       Past Gynecological History: As per HPI.  Social History:  Social History   Socioeconomic History   Marital status: Married    Spouse name: Not on file   Number of children: Not on file   Years of education: Not on file   Highest education level: Not on file  Occupational History   Not on file  Tobacco Use   Smoking status: Never   Smokeless tobacco: Never  Vaping Use   Vaping Use: Never used  Substance and Sexual Activity   Alcohol use: No    Alcohol/week: 0.0 standard drinks of alcohol   Drug use: No   Sexual activity: Yes  Other Topics Concern   Not on file  Social  History Narrative   Not on file   Social Determinants of Health   Financial Resource Strain: Not on file  Food Insecurity: No Food Insecurity (07/10/2022)   Hunger Vital Sign    Worried About Running Out of Food in the Last Year: Never true    Ran Out of Food in the Last Year: Never true  Transportation Needs: No Transportation Needs (07/10/2022)   PRAPARE - Administrator, Civil Service (Medical): No    Lack of Transportation (Non-Medical): No  Physical Activity: Not on file  Stress: Not on file  Social Connections: Not on file  Intimate Partner Violence: Not on file    Family History:  Family History  Problem Relation Age of Onset   Hypertension Mother    Diabetes Father     Medications Brenda Bell had no medications administered during this visit. Current Outpatient Medications  Medication Sig Dispense Refill   cetirizine (ZYRTEC) 10 MG tablet Take 1 tablet (10 mg total) by mouth daily. 30 tablet 11   albuterol (VENTOLIN HFA) 108 (90 Base) MCG/ACT inhaler Inhale 1-2 puffs into the lungs every 6 (six) hours as needed for wheezing or shortness of breath. (Patient not taking: Reported on 07/10/2022) 18 g 0   omeprazole (PRILOSEC) 20 MG capsule Take 1 capsule (20 mg total) by mouth daily. (Patient not taking: Reported on 07/30/2022) 30 capsule 3   ondansetron (ZOFRAN-ODT) 4 MG disintegrating tablet Take 1 tablet (4 mg total) by mouth every 8 (eight) hours as needed for nausea or vomiting. (Patient not taking: Reported on 07/30/2022) 20 tablet 0   tranexamic acid (LYSTEDA) 650 MG TABS tablet Take 2 tablets (1,300 mg total) by mouth 3 (three) times daily. (Patient not taking: Reported on 06/11/2022) 30 tablet 0   No current facility-administered medications for this visit.    Allergies Hydrocodone-acetaminophen   Physical Exam:  BP 133/86   Pulse 75   Ht 5\' 5"  (1.651 m)   Wt 205 lb 11.2 oz (93.3 kg)   BMI 34.23 kg/m  Body mass index is 34.23 kg/m. General  appearance: Well nourished, well developed female in no acute distress.  Neck:  Supple, normal appearance, and no thyromegaly  Cardiovascular: normal s1 and s2.  No murmurs, rubs or gallops. Respiratory:  Clear to auscultation bilateral. Normal respiratory effort Abdomen: positive bowel sounds and no masses, hernias; diffusely non tender to palpation, non distended Neuro/Psych:  Normal mood and affect.  Skin:  Warm and  dry.  Lymphatic:  No inguinal lymphadenopathy.   Cervical exam performed in the presence of a chaperone Pelvic exam: is limited by body habitus EGBUS: within normal limits Vagina: within normal limits and with no blood or discharge in the vault Cervix: normal appearing cervix without tenderness, discharge or lesions. Uterus:  nonenlarged and non tender Adnexa:  normal adnexa and no mass, fullness, tenderness Rectovaginal: deferred  See procedure note embx. Cervical stenosis noted  Laboratory: as per HPI  Radiology:  Ultrasound images reviewed  Narrative & Impression  CLINICAL DATA:  Heavy bleeding. Postmenopausal woman. History of fibroid. History of right ovarian tumor removed at age 50.   EXAM: TRANSABDOMINAL AND TRANSVAGINAL ULTRASOUND OF PELVIS   TECHNIQUE: Both transabdominal and transvaginal ultrasound examinations of the pelvis were performed. Transabdominal technique was performed for global imaging of the pelvis including uterus, ovaries, adnexal regions, and pelvic cul-de-sac. It was necessary to proceed with endovaginal exam following the transabdominal exam to visualize the endometrium and ovaries.   COMPARISON:  CT scan of the abdomen and pelvis October 26, 2016. Pelvic ultrasound June 11, 2019.   FINDINGS: Uterus   Measurements: 9.7 x 4.8 x 7.0 cm = volume: 170 mL. Again noted is a mass in the posterior uterine body measuring 2.0 x 2.0 x 1.8 cm today versus 1.5 x 1.6 x 1.8 cm on the comparison ultrasound from 2021. The mass demonstrates  significant vascularity.   Endometrium   Thickness: 7.2 mm.  No focal abnormality visualized.   Right ovary   Not visualized.  Possibly surgically absent.   Left ovary   Measurements: 3.5 x 2.0 x 2.1 cm = volume: 7.8 mL. Normal appearance/no adnexal mass.   Other findings   No abnormal free fluid.   IMPRESSION: 1. The endometrium measures 7.2 mm in thickness in this postmenopausal woman. In the setting of post-menopausal bleeding, endometrial sampling is indicated to exclude carcinoma. If results are benign, sonohysterogram should be considered for focal lesion work-up. (Ref: Radiological Reasoning: Algorithmic Workup of Abnormal Vaginal Bleeding with Endovaginal Sonography and Sonohysterography. AJR 2008; 562:Z30-86) 2. The 2 x 2 x 1.8 cm mass in the posterior uterine body is favored to represent a submucosal fibroid. The mass has slowly grown since first identified on CT imaging October 26, 2016 and the mass is hypervascular. Consider sonohysterogram at the time of endometrial biopsy to exclude the less likely possibility this mass is endometrial in origin.     Electronically Signed   By: Gerome Sam III M.D.   On: 06/03/2022 16:39   Narrative & Impression  CLINICAL DATA:  Abdominal pain, acute, nonlocalized. Diarrhea since last night.   EXAM: CT ABDOMEN AND PELVIS WITH CONTRAST   TECHNIQUE: Multidetector CT imaging of the abdomen and pelvis was performed using the standard protocol following bolus administration of intravenous contrast.   RADIATION DOSE REDUCTION: This exam was performed according to the departmental dose-optimization program which includes automated exposure control, adjustment of the mA and/or kV according to patient size and/or use of iterative reconstruction technique.   CONTRAST:  OMNIPAQUE IOHEXOL 300 MG/ML  SOLN   COMPARISON:  Abdominopelvic CT 06/09/2022 and 10/26/2016.   FINDINGS: Lower chest: No acute findings. Linear  scarring at both lung bases, similar to previous study. No significant pleural or pericardial effusion.   Hepatobiliary: The liver is normal in density without suspicious focal abnormality. No evidence of biliary dilatation status post cholecystectomy.   Pancreas: Unremarkable. No pancreatic ductal dilatation or surrounding inflammatory changes.  Spleen: Normal in size without focal abnormality.   Adrenals/Urinary Tract: Both adrenal glands appear normal. No evidence of urinary tract calculus, suspicious renal lesion or hydronephrosis. The bladder appears unremarkable for its degree of distention.   Stomach/Bowel: No enteric contrast administered. The stomach appears unremarkable for its degree of distension. No evidence of bowel wall thickening, distention or surrounding inflammatory change. Postsurgical changes at the cecal tip consistent with prior appendectomy.   Vascular/Lymphatic: There are no enlarged abdominal or pelvic lymph nodes. Mildly prominent inguinal lymph nodes bilaterally are unchanged, likely reactive. No significant vascular findings. Circumaortic left renal vein noted incidentally.   Reproductive: Submucosal nodularity within the uterus, similar to previous study and felt to reflect a submucosal fibroid on ultrasound. No suspicious adnexal findings.   Other: Postsurgical changes in the anterior abdominal wall. No evidence of abdominal wall hernia, ascites or free intraperitoneal air.   Musculoskeletal: No acute or significant osseous findings.   IMPRESSION: 1. No acute findings or explanation for the patient's symptoms. 2. Postsurgical changes as described. 3. Stable appearance of the uterus with findings consistent with a submucosal fibroid.     Electronically Signed   By: Carey Bullocks M.D.   On: 06/29/2022 12:12   Assessment: patient  Plan:  1. Postmenopausal bleeding I don't see a TSH in the system so will check this. No obvious  atrophy on exam and pt denies any hot flashes, night sweats, vaginal dryness s/s. Will check TSH, estradiol and fsh with pre op labs. I told her that if embx is negative, it's still reasonable to proceed to the OR for full evaluation and possible fibroid removal. The risks of surgery were discussed in detail with the patient including but not limited to: bleeding which may require transfusion or reoperation; infection which may require prolonged hospitalization or re-hospitalization and antibiotic therapy; injury to bowel, bladder, ureters and major vessels or other surrounding organs which may lead to other procedures; formation of adhesions; need for additional procedures including laparotomy or subsequent procedures secondary to intraoperative injury or abnormal pathology; thromboembolic phenomenon; incisional problems and other postoperative or anesthesia complications.  Patient was told that the likelihood that her condition and symptoms will be treated effectively with this surgical management was very high; the postoperative expectations were also discussed in detail. The patient also understands the alternative treatment options which were discussed in full. All questions were answered, and patient would like to proceed - Surgical pathology( / [facility])  2. Cervical stenosis (uterine cervix)  3. Language barrier In person interperter used  4. History of raised TSH See above    RTC post op  Return if symptoms worsen or fail to improve.  Future Appointments  Date Time Provider Department Center  09/17/2022  3:00 PM Brenda Andrew, NP [facility] None    Cornelia Copa MD Attending Center for [employer] [facility])

## 2022-08-02 NOTE — H&P (View-Only) (Signed)
Obstetrics and Gynecology H&P  Appointment Date: 07/30/2022  OBGYN Clinic: [facility]  Primary Care Provider: Nichols, Tonya S  Referring Provider: Dr. Eckstat  Chief Complaint: postmenopausal bleeding  History of Present Illness: Brenda Bell is a 51 y.o. Hispanic [id number], seen for the above chief complaint. 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 Eckstat on 4/9 for AUB for 3/3 ED follow up. On 3/3, she had VB that started a few days prior. Quant neg, with cbc, cmp, wet prep, gc/ct also negative. U/s showed ES of 7.2mm that was normal with a possible 2cm posterior submucosal fibroid seen. GYN consulted and recommended Lysteda and outpatient follow up.   With Dr. Eckstat, she had a negative pap and hpv testing and an embx dont but sampling came back only for benign endocervical cells; he was only able to sound her to 5cm.   He asked me to take her to the OR, but I wanted to see her in the office first and try and embx before taking her to the OR.   No VB since when she was in the ED in March, but she did note some VB for awhile after the embx attempt. She states her last period before March was over a year prior.   Review of Systems: Pertinent items noted in HPI and remainder of comprehensive ROS otherwise negative.   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    Past Medical History:   Past Medical History:  Diagnosis Date   Anemia, iron deficiency 08/20/2013   Asthma    B12 deficiency    History of gastritis 2017   PMB (postmenopausal bleeding)    Stenosis of cervix    Uterine fibroid    Vitamin D deficiency     Past Surgical History:  Past Surgical History:  Procedure Laterality Date   CESAREAN SECTION     x3  last one 1997   CHOLECYSTECTOMY, LAPAROSCOPIC  10/18/2006   @[facility] by dr b. thompson   COLONOSCOPY WITH ESOPHAGOGASTRODUODENOSCOPY (EGD)  06/06/2015   dr pyrtle   EXCISION OF BREAST BIOPSY Left 1998   benign   KNEE ARTHROSCOPY Right 05/25/2003   @[facility] by dr olin   KNEE ARTHROSCOPY W/ SYNOVECTOMY Right 08/20/2003   @[facility] by dr olin;   manipulation under anesthesia and lysis adhesions   LAPAROSCOPIC APPENDECTOMY N/A 10/26/2016   Procedure: APPENDECTOMY LAPAROSCOPIC;  Surgeon: Connor, Chelsea A, MD;  Location: [facility] OR;  Service: General;  Laterality: N/A;   OVARY SURGERY Right    age 15;    benign tumor of right ovary removed   TONSILLECTOMY Bilateral 1995    Past Obstetrical History:  OB History  Gravida Para Term Preterm AB Living  3 3 3     3  SAB IAB Ectopic Multiple Live Births               #   Outcome Date GA Lbr Len/2nd Weight Sex Delivery Anes PTL Lv  3 Term 03/12/96    M CS-Unspec     2 Term 07/11/92    M CS-Unspec     1 Term 02/24/90    M CS-Unspec       Past Gynecological History: As per HPI.  Social History:  Social History   Socioeconomic History   Marital status: Married    Spouse name: Not on file   Number of children: Not on file   Years of education: Not on file   Highest education level: Not on file  Occupational History   Not on file  Tobacco Use   Smoking status: Never   Smokeless tobacco: Never  Vaping Use   Vaping Use: Never used  Substance and Sexual Activity   Alcohol use: No    Alcohol/week: 0.0 standard drinks of alcohol   Drug use: No   Sexual activity: Yes  Other Topics Concern   Not on file  Social  History Narrative   Not on file   Social Determinants of Health   Financial Resource Strain: Not on file  Food Insecurity: No Food Insecurity (07/10/2022)   Hunger Vital Sign    Worried About Running Out of Food in the Last Year: Never true    Ran Out of Food in the Last Year: Never true  Transportation Needs: No Transportation Needs (07/10/2022)   PRAPARE - Transportation    Lack of Transportation (Medical): No    Lack of Transportation (Non-Medical): No  Physical Activity: Not on file  Stress: Not on file  Social Connections: Not on file  Intimate Partner Violence: Not on file    Family History:  Family History  Problem Relation Age of Onset   Hypertension Mother    Diabetes Father     Medications Brenda Bell had no medications administered during this visit. Current Outpatient Medications  Medication Sig Dispense Refill   cetirizine (ZYRTEC) 10 MG tablet Take 1 tablet (10 mg total) by mouth daily. 30 tablet 11   albuterol (VENTOLIN HFA) 108 (90 Base) MCG/ACT inhaler Inhale 1-2 puffs into the lungs every 6 (six) hours as needed for wheezing or shortness of breath. (Patient not taking: Reported on 07/10/2022) 18 g 0   omeprazole (PRILOSEC) 20 MG capsule Take 1 capsule (20 mg total) by mouth daily. (Patient not taking: Reported on 07/30/2022) 30 capsule 3   ondansetron (ZOFRAN-ODT) 4 MG disintegrating tablet Take 1 tablet (4 mg total) by mouth every 8 (eight) hours as needed for nausea or vomiting. (Patient not taking: Reported on 07/30/2022) 20 tablet 0   tranexamic acid (LYSTEDA) 650 MG TABS tablet Take 2 tablets (1,300 mg total) by mouth 3 (three) times daily. (Patient not taking: Reported on 06/11/2022) 30 tablet 0   No current facility-administered medications for this visit.    Allergies Hydrocodone-acetaminophen   Physical Exam:  BP 133/86   Pulse 75   Ht 5' 5" (1.651 m)   Wt 205 lb 11.2 oz (93.3 kg)   BMI 34.23 kg/m  Body mass index is 34.23 kg/m. General  appearance: Well nourished, well developed female in no acute distress.  Neck:  Supple, normal appearance, and no thyromegaly  Cardiovascular: normal s1 and s2.  No murmurs, rubs or gallops. Respiratory:  Clear to auscultation bilateral. Normal respiratory effort Abdomen: positive bowel sounds and no masses, hernias; diffusely non tender to palpation, non distended Neuro/Psych:  Normal mood and affect.  Skin:  Warm and   dry.  Lymphatic:  No inguinal lymphadenopathy.   Cervical exam performed in the presence of a chaperone Pelvic exam: is limited by body habitus EGBUS: within normal limits Vagina: within normal limits and with no blood or discharge in the vault Cervix: normal appearing cervix without tenderness, discharge or lesions. Uterus:  nonenlarged and non tender Adnexa:  normal adnexa and no mass, fullness, tenderness Rectovaginal: deferred  See procedure note embx. Cervical stenosis noted  Laboratory: as per HPI  Radiology:  Ultrasound images reviewed  Narrative & Impression  CLINICAL DATA:  Heavy bleeding. Postmenopausal woman. History of fibroid. History of right ovarian tumor removed at age 15.   EXAM: TRANSABDOMINAL AND TRANSVAGINAL ULTRASOUND OF PELVIS   TECHNIQUE: Both transabdominal and transvaginal ultrasound examinations of the pelvis were performed. Transabdominal technique was performed for global imaging of the pelvis including uterus, ovaries, adnexal regions, and pelvic cul-de-sac. It was necessary to proceed with endovaginal exam following the transabdominal exam to visualize the endometrium and ovaries.   COMPARISON:  CT scan of the abdomen and pelvis October 26, 2016. Pelvic ultrasound June 11, 2019.   FINDINGS: Uterus   Measurements: 9.7 x 4.8 x 7.0 cm = volume: 170 mL. Again noted is a mass in the posterior uterine body measuring 2.0 x 2.0 x 1.8 cm today versus 1.5 x 1.6 x 1.8 cm on the comparison ultrasound from 2021. The mass demonstrates  significant vascularity.   Endometrium   Thickness: 7.2 mm.  No focal abnormality visualized.   Right ovary   Not visualized.  Possibly surgically absent.   Left ovary   Measurements: 3.5 x 2.0 x 2.1 cm = volume: 7.8 mL. Normal appearance/no adnexal mass.   Other findings   No abnormal free fluid.   IMPRESSION: 1. The endometrium measures 7.2 mm in thickness in this postmenopausal woman. In the setting of post-menopausal bleeding, endometrial sampling is indicated to exclude carcinoma. If results are benign, sonohysterogram should be considered for focal lesion work-up. (Ref: Radiological Reasoning: Algorithmic Workup of Abnormal Vaginal Bleeding with Endovaginal Sonography and Sonohysterography. AJR 2008; 191:S68-73) 2. The 2 x 2 x 1.8 cm mass in the posterior uterine body is favored to represent a submucosal fibroid. The mass has slowly grown since first identified on CT imaging October 26, 2016 and the mass is hypervascular. Consider sonohysterogram at the time of endometrial biopsy to exclude the less likely possibility this mass is endometrial in origin.     Electronically Signed   By: David  Williams III M.D.   On: 06/03/2022 16:39   Narrative & Impression  CLINICAL DATA:  Abdominal pain, acute, nonlocalized. Diarrhea since last night.   EXAM: CT ABDOMEN AND PELVIS WITH CONTRAST   TECHNIQUE: Multidetector CT imaging of the abdomen and pelvis was performed using the standard protocol following bolus administration of intravenous contrast.   RADIATION DOSE REDUCTION: This exam was performed according to the departmental dose-optimization program which includes automated exposure control, adjustment of the mA and/or kV according to patient size and/or use of iterative reconstruction technique.   CONTRAST:  100mL OMNIPAQUE IOHEXOL 300 MG/ML  SOLN   COMPARISON:  Abdominopelvic CT 06/09/2022 and 10/26/2016.   FINDINGS: Lower chest: No acute findings. Linear  scarring at both lung bases, similar to previous study. No significant pleural or pericardial effusion.   Hepatobiliary: The liver is normal in density without suspicious focal abnormality. No evidence of biliary dilatation status post cholecystectomy.   Pancreas: Unremarkable. No pancreatic ductal dilatation or surrounding inflammatory changes.     Spleen: Normal in size without focal abnormality.   Adrenals/Urinary Tract: Both adrenal glands appear normal. No evidence of urinary tract calculus, suspicious renal lesion or hydronephrosis. The bladder appears unremarkable for its degree of distention.   Stomach/Bowel: No enteric contrast administered. The stomach appears unremarkable for its degree of distension. No evidence of bowel wall thickening, distention or surrounding inflammatory change. Postsurgical changes at the cecal tip consistent with prior appendectomy.   Vascular/Lymphatic: There are no enlarged abdominal or pelvic lymph nodes. Mildly prominent inguinal lymph nodes bilaterally are unchanged, likely reactive. No significant vascular findings. Circumaortic left renal vein noted incidentally.   Reproductive: Submucosal nodularity within the uterus, similar to previous study and felt to reflect a submucosal fibroid on ultrasound. No suspicious adnexal findings.   Other: Postsurgical changes in the anterior abdominal wall. No evidence of abdominal wall hernia, ascites or free intraperitoneal air.   Musculoskeletal: No acute or significant osseous findings.   IMPRESSION: 1. No acute findings or explanation for the patient's symptoms. 2. Postsurgical changes as described. 3. Stable appearance of the uterus with findings consistent with a submucosal fibroid.     Electronically Signed   By: William  Veazey M.D.   On: 06/29/2022 12:12   Assessment: patient  Plan:  1. Postmenopausal bleeding I don't see a TSH in the system so will check this. No obvious  atrophy on exam and pt denies any hot flashes, night sweats, vaginal dryness s/s. Will check TSH, estradiol and fsh with pre op labs. I told her that if embx is negative, it's still reasonable to proceed to the OR for full evaluation and possible fibroid removal. The risks of surgery were discussed in detail with the patient including but not limited to: bleeding which may require transfusion or reoperation; infection which may require prolonged hospitalization or re-hospitalization and antibiotic therapy; injury to bowel, bladder, ureters and major vessels or other surrounding organs which may lead to other procedures; formation of adhesions; need for additional procedures including laparotomy or subsequent procedures secondary to intraoperative injury or abnormal pathology; thromboembolic phenomenon; incisional problems and other postoperative or anesthesia complications.  Patient was told that the likelihood that her condition and symptoms will be treated effectively with this surgical management was very high; the postoperative expectations were also discussed in detail. The patient also understands the alternative treatment options which were discussed in full. All questions were answered, and patient would like to proceed - Surgical pathology( [employer]/ [facility])  2. Cervical stenosis (uterine cervix)  3. Language barrier In person interperter used  4. History of raised TSH See above    RTC post op  Return if symptoms worsen or fail to improve.  Future Appointments  Date Time Provider Department Center  09/17/2022  3:00 PM Nichols, Tonya S, NP [facility] None    Jakaleb Payer, Jr MD Attending [facility] (Faculty Practice)       

## 2022-08-03 ENCOUNTER — Encounter (HOSPITAL_BASED_OUTPATIENT_CLINIC_OR_DEPARTMENT_OTHER): Payer: Self-pay | Admitting: Obstetrics and Gynecology

## 2022-08-03 NOTE — Progress Notes (Signed)
Spoke w/ via phone for pre-op interview--- pt thru two spanish pac[place] interpreter were used since unable to finis interview this morning she had to go back to work,  this morning interpreter ID # [id number] and this afternoon interpreter ID # [id number] Lab needs dos---- urine preg, fsh, tsh, estradiol              Lab results------ no COVID test -----patient states asymptomatic no test needed Arrive at ------- 0900 on 08-08-2022 NPO after MN  Med rec completed Medications to take morning of surgery -----  none Diabetic medication ----- n/a Patient instructed no nail polish to be worn day of surgery Patient instructed to bring photo id and insurance card day of surgery Patient aware to have Driver (ride ) / caregiver    for 24 hours after surgery -- husband, augustin Patient Special Instructions ----- n/a Pre-Op special Instructions ----- pt request female spanish interpreter.  Sent  Patient verbalized understanding of instructions that were given at this phone interview. Patient denies shortness of breath, chest pain, fever, cough at this phone interview.

## 2022-08-06 ENCOUNTER — Telehealth: Payer: Self-pay

## 2022-08-06 NOTE — Telephone Encounter (Addendum)
-----   Message from [place] Bing, MD sent at 08/02/2022  8:45 AM EDT ----- Can you let her know the biopsy came back benign and no cancer, and I still recommend proceeding next week to the OR for the scheduled hysteroscopy, d&c and possible fibroid removal? Thanks  Called pt with interpreter Debarah Crape; VM left stating we are calling with normal results and callback number given.

## 2022-08-06 NOTE — Telephone Encounter (Signed)
Pt came into front office for results. Results given with interpreter Claudia.

## 2022-08-08 ENCOUNTER — Other Ambulatory Visit: Payer: Self-pay

## 2022-08-08 ENCOUNTER — Ambulatory Visit (HOSPITAL_BASED_OUTPATIENT_CLINIC_OR_DEPARTMENT_OTHER): Payer: 59 | Admitting: Anesthesiology

## 2022-08-08 ENCOUNTER — Encounter (HOSPITAL_BASED_OUTPATIENT_CLINIC_OR_DEPARTMENT_OTHER)
Admission: RE | Disposition: A | Discharge status: Home / Self Care | Payer: Self-pay | Source: Ambulatory Visit | Attending: Obstetrics and Gynecology

## 2022-08-08 ENCOUNTER — Ambulatory Visit (HOSPITAL_BASED_OUTPATIENT_CLINIC_OR_DEPARTMENT_OTHER)
Admission: RE | Admit: 2022-08-08 | Discharge: 2022-08-08 | Disposition: A | Discharge status: Home / Self Care | Payer: 59 | Source: Ambulatory Visit | Attending: Obstetrics and Gynecology | Admitting: Obstetrics and Gynecology

## 2022-08-08 ENCOUNTER — Encounter (HOSPITAL_BASED_OUTPATIENT_CLINIC_OR_DEPARTMENT_OTHER): Payer: Self-pay | Admitting: Obstetrics and Gynecology

## 2022-08-08 DIAGNOSIS — Z01818 Encounter for other preprocedural examination: Secondary | ICD-10-CM

## 2022-08-08 DIAGNOSIS — N950 Postmenopausal bleeding: Secondary | ICD-10-CM | POA: Diagnosis not present

## 2022-08-08 DIAGNOSIS — J45909 Unspecified asthma, uncomplicated: Secondary | ICD-10-CM

## 2022-08-08 DIAGNOSIS — Z9889 Other specified postprocedural states: Secondary | ICD-10-CM

## 2022-08-08 DIAGNOSIS — D250 Submucous leiomyoma of uterus: Secondary | ICD-10-CM | POA: Insufficient documentation

## 2022-08-08 HISTORY — DX: Postprocedural hypotension: I95.81

## 2022-08-08 HISTORY — DX: Other complications of anesthesia, initial encounter: T88.59XA

## 2022-08-08 HISTORY — DX: Unspecified asthma, uncomplicated: J45.909

## 2022-08-08 HISTORY — DX: Leiomyoma of uterus, unspecified: D25.9

## 2022-08-08 HISTORY — DX: Postmenopausal bleeding: N95.0

## 2022-08-08 HISTORY — DX: Stricture and stenosis of cervix uteri: N88.2

## 2022-08-08 HISTORY — DX: Prediabetes: R73.03

## 2022-08-08 HISTORY — PX: DILATATION & CURETTAGE/HYSTEROSCOPY WITH MYOSURE: SHX6511

## 2022-08-08 HISTORY — DX: Abnormal uterine and vaginal bleeding, unspecified: N93.9

## 2022-08-08 LAB — POCT PREGNANCY, URINE: Preg Test, Ur: NEGATIVE

## 2022-08-08 LAB — GLUCOSE, CAPILLARY: Glucose-Capillary: 92 mg/dL (ref 70–99)

## 2022-08-08 SURGERY — DILATATION & CURETTAGE/HYSTEROSCOPY WITH MYOSURE
Anesthesia: General | Site: Vagina

## 2022-08-08 MED ORDER — SODIUM CHLORIDE 0.9 % IR SOLN
Status: DC | PRN
  Administered 2022-08-08: 3000 mL

## 2022-08-08 MED ORDER — MIDAZOLAM HCL 2 MG/2ML IJ SOLN
INTRAMUSCULAR | Status: AC
  Filled 2022-08-08: qty 2

## 2022-08-08 MED ORDER — EPHEDRINE SULFATE-NACL 50-0.9 MG/10ML-% IV SOSY
PREFILLED_SYRINGE | INTRAVENOUS | Status: DC | PRN
  Administered 2022-08-08: 10 mg via INTRAVENOUS

## 2022-08-08 MED ORDER — IBUPROFEN 200 MG PO TABS: 600.0000 mg | ORAL_TABLET | Freq: Four times a day (QID) | ORAL | 0 refills | Status: DC | PRN

## 2022-08-08 MED ORDER — EPHEDRINE 5 MG/ML INJ
INTRAVENOUS | Status: AC
  Filled 2022-08-08: qty 5

## 2022-08-08 MED ORDER — PROPOFOL 10 MG/ML IV BOLUS
INTRAVENOUS | Status: AC
  Filled 2022-08-08: qty 20

## 2022-08-08 MED ORDER — OXYCODONE HCL 5 MG/5ML PO SOLN: 5.0000 mg | Freq: Once | ORAL | Status: DC | PRN

## 2022-08-08 MED ORDER — DEXAMETHASONE SODIUM PHOSPHATE 10 MG/ML IJ SOLN
INTRAMUSCULAR | Status: AC
  Filled 2022-08-08: qty 1

## 2022-08-08 MED ORDER — KETOROLAC TROMETHAMINE 30 MG/ML IJ SOLN
INTRAMUSCULAR | Status: DC | PRN
  Administered 2022-08-08: 30 mg via INTRAVENOUS

## 2022-08-08 MED ORDER — DEXAMETHASONE SODIUM PHOSPHATE 10 MG/ML IJ SOLN
INTRAMUSCULAR | Status: DC | PRN
  Administered 2022-08-08: 5 mg via INTRAVENOUS

## 2022-08-08 MED ORDER — LIDOCAINE HCL 1 % IJ SOLN
INTRAMUSCULAR | Status: DC | PRN
  Administered 2022-08-08: 20 mL

## 2022-08-08 MED ORDER — FENTANYL CITRATE (PF) 100 MCG/2ML IJ SOLN: 25.0000 ug | INTRAMUSCULAR | Status: DC | PRN

## 2022-08-08 MED ORDER — OXYCODONE HCL 5 MG PO TABS: 5.0000 mg | ORAL_TABLET | Freq: Once | ORAL | Status: DC | PRN

## 2022-08-08 MED ORDER — SODIUM CHLORIDE 0.9 % IV SOLN
INTRAVENOUS | Status: DC
  Administered 2022-08-08: 1000 mL via INTRAVENOUS

## 2022-08-08 MED ORDER — PROPOFOL 10 MG/ML IV BOLUS
INTRAVENOUS | Status: DC | PRN
  Administered 2022-08-08: 170 mg via INTRAVENOUS

## 2022-08-08 MED ORDER — PHENYLEPHRINE 80 MCG/ML (10ML) SYRINGE FOR IV PUSH (FOR BLOOD PRESSURE SUPPORT)
PREFILLED_SYRINGE | INTRAVENOUS | Status: DC | PRN
  Administered 2022-08-08: 160 ug via INTRAVENOUS
  Administered 2022-08-08: 80 ug via INTRAVENOUS
  Administered 2022-08-08: 160 ug via INTRAVENOUS

## 2022-08-08 MED ORDER — ONDANSETRON HCL 4 MG/2ML IJ SOLN: 4.0000 mg | Freq: Four times a day (QID) | INTRAMUSCULAR | Status: DC | PRN

## 2022-08-08 MED ORDER — ACETAMINOPHEN 500 MG PO TABS: 1000.0000 mg | ORAL_TABLET | Freq: Four times a day (QID) | ORAL | 0 refills | Status: DC | PRN

## 2022-08-08 MED ORDER — LACTATED RINGERS IV SOLN: INTRAVENOUS | Status: DC

## 2022-08-08 MED ORDER — FENTANYL CITRATE (PF) 100 MCG/2ML IJ SOLN
INTRAMUSCULAR | Status: AC
  Filled 2022-08-08: qty 2

## 2022-08-08 MED ORDER — LIDOCAINE 2% (20 MG/ML) 5 ML SYRINGE
INTRAMUSCULAR | Status: DC | PRN
  Administered 2022-08-08: 60 mg via INTRAVENOUS

## 2022-08-08 MED ORDER — LIDOCAINE HCL (PF) 2 % IJ SOLN
INTRAMUSCULAR | Status: AC
  Filled 2022-08-08: qty 15

## 2022-08-08 MED ORDER — ONDANSETRON HCL 4 MG/2ML IJ SOLN
INTRAMUSCULAR | Status: DC | PRN
  Administered 2022-08-08: 4 mg via INTRAVENOUS

## 2022-08-08 MED ORDER — ONDANSETRON HCL 4 MG/2ML IJ SOLN
INTRAMUSCULAR | Status: AC
  Filled 2022-08-08: qty 2

## 2022-08-08 MED ORDER — FENTANYL CITRATE (PF) 100 MCG/2ML IJ SOLN
INTRAMUSCULAR | Status: DC | PRN
  Administered 2022-08-08: 100 ug via INTRAVENOUS

## 2022-08-08 MED ORDER — PHENYLEPHRINE 80 MCG/ML (10ML) SYRINGE FOR IV PUSH (FOR BLOOD PRESSURE SUPPORT)
PREFILLED_SYRINGE | INTRAVENOUS | Status: AC
  Filled 2022-08-08: qty 10

## 2022-08-08 MED ORDER — MIDAZOLAM HCL 5 MG/5ML IJ SOLN
INTRAMUSCULAR | Status: DC | PRN
  Administered 2022-08-08: 2 mg via INTRAVENOUS

## 2022-08-08 SURGICAL SUPPLY — 19 items
CATH ROBINSON RED A/P 16FR (CATHETERS) ×1 IMPLANT
DEVICE MYOSURE LITE (MISCELLANEOUS) IMPLANT
DEVICE MYOSURE REACH (MISCELLANEOUS) IMPLANT
DILATOR CANAL MILEX (MISCELLANEOUS) IMPLANT
DRSG TELFA 3X8 NADH STRL (GAUZE/BANDAGES/DRESSINGS) ×1 IMPLANT
GAUZE 4X4 16PLY ~~LOC~~+RFID DBL (SPONGE) ×2 IMPLANT
GLOVE BIOGEL PI IND STRL 7.5 (GLOVE) ×1 IMPLANT
GLOVE SURG SS PI 7.0 STRL IVOR (GLOVE) ×1 IMPLANT
GOWN STRL REUS W/TWL XL LVL3 (GOWN DISPOSABLE) ×1 IMPLANT
KIT PROCEDURE FLUENT (KITS) ×1 IMPLANT
MYOSURE XL FIBROID (MISCELLANEOUS)
PACK VAGINAL MINOR WOMEN LF (CUSTOM PROCEDURE TRAY) ×1 IMPLANT
PAD OB MATERNITY 4.3X12.25 (PERSONAL CARE ITEMS) ×1 IMPLANT
PAD PREP 24X48 CUFFED NSTRL (MISCELLANEOUS) ×1 IMPLANT
SEAL ROD LENS SCOPE MYOSURE (ABLATOR) ×1 IMPLANT
SUT VIC AB 2-0 SH 27 (SUTURE)
SUT VIC AB 2-0 SH 27XBRD (SUTURE) IMPLANT
SYSTEM TISS REMOVAL MYOSURE XL (MISCELLANEOUS) IMPLANT
TOWEL OR 17X24 6PK STRL BLUE (TOWEL DISPOSABLE) ×1 IMPLANT

## 2022-08-08 NOTE — Anesthesia Procedure Notes (Signed)
Procedure Name: LMA Insertion Date/Time: 08/08/2022 11:14 AM  Performed by: Bishop Limbo, CRNAPre-anesthesia Checklist: Patient identified, Emergency Drugs available, Suction available and Patient being monitored Patient Re-evaluated:Patient Re-evaluated prior to induction Oxygen Delivery Method: Circle System Utilized Preoxygenation: Pre-oxygenation with 100% oxygen Induction Type: IV induction Ventilation: Mask ventilation without difficulty LMA: LMA inserted LMA Size: 4.0 Number of attempts: 1 Placement Confirmation: positive ETCO2 Tube secured with: Tape Dental Injury: Teeth and Oropharynx as per pre-operative assessment

## 2022-08-08 NOTE — Transfer of Care (Signed)
Immediate Anesthesia Transfer of Care Note  Patient: Brenda Bell  Procedure(s) Performed: DILATATION & CURETTAGE/HYSTEROSCOPY WITH MYOSURE MYOMECTOMY (Vagina )  Patient Location: PACU  Anesthesia Type:General  Level of Consciousness: awake, alert , oriented, and patient cooperative  Airway & Oxygen Therapy: Patient Spontanous Breathing  Post-op Assessment: Report given to RN and Post -op Vital signs reviewed and stable  Post vital signs: Reviewed and stable  Last Vitals:  Vitals Value Taken Time  BP 133/92 08/08/22 1148  Temp    Pulse 86 08/08/22 1152  Resp 14 08/08/22 1152  SpO2 97 % 08/08/22 1152  Vitals shown include unvalidated device data.  Last Pain:  Vitals:   08/08/22 0902  TempSrc: Oral  PainSc: 0-No pain      Patients Stated Pain Goal: 7 (08/08/22 0902)  Complications: No notable events documented.

## 2022-08-08 NOTE — Anesthesia Preprocedure Evaluation (Signed)
Anesthesia Evaluation  Patient identified by MRN, date of birth, ID band Patient awake    Reviewed: Allergy & Precautions, H&P , NPO status , Patient's Chart, lab work & pertinent test results  Airway Mallampati: II   Neck ROM: full    Dental   Pulmonary asthma    breath sounds clear to auscultation       Cardiovascular negative cardio ROS  Rhythm:regular Rate:Normal     Neuro/Psych    GI/Hepatic ,GERD  ,,  Endo/Other    Renal/GU      Musculoskeletal   Abdominal   Peds  Hematology   Anesthesia Other Findings   Reproductive/Obstetrics Abnormal uterine bleeding                             Anesthesia Physical Anesthesia Plan  ASA: 2  Anesthesia Plan: General   Post-op Pain Management:    Induction: Intravenous  PONV Risk Score and Plan: 3 and Ondansetron, Dexamethasone, Midazolam and Treatment may vary due to age or medical condition  Airway Management Planned: LMA  Additional Equipment:   Intra-op Plan:   Post-operative Plan: Extubation in OR  Informed Consent: I have reviewed the patients History and Physical, chart, labs and discussed the procedure including the risks, benefits and alternatives for the proposed anesthesia with the patient or authorized representative who has indicated his/her understanding and acceptance.     Dental advisory given  Plan Discussed with: CRNA, Anesthesiologist and Surgeon  Anesthesia Plan Comments:        Anesthesia Quick Evaluation

## 2022-08-08 NOTE — Interval H&P Note (Signed)
History and Physical Interval Note:  08/08/2022 11:06 AM  Brenda Bell  has presented today for surgery, with the diagnosis of POST MENOPAUSAL BLEEDING.  The various methods of treatment have been discussed with the patient and family. After consideration of risks, benefits and other options for treatment, the patient has consented to  Procedure(s): DILATATION & CURETTAGE/HYSTEROSCOPY WITH MYOSURE (N/A) as a surgical intervention.  The patient's history has been reviewed, patient examined, no change in status, stable for surgery.  I have reviewed the patient's chart and labs.  Questions were answered to the patient's satisfaction.     Melville Bing

## 2022-08-08 NOTE — Op Note (Signed)
Operative Note   08/08/2022  PRE-OP DIAGNOSIS: Postmenopausal bleeding. ?submucosal fibroid   POST-OP DIAGNOSIS: PMB. Submucosal fibroid (type 2)  SURGEON: Surgeon(s) and Role:    * [place] Bing, MD - Primary  ASSISTANT: None  PROCEDURE: Hysteroscopy, dilation and curettage, Myosure myomectomy  ANESTHESIA: General and local   ESTIMATED BLOOD LOSS: 5mL  DRAINS: none   TOTAL IV FLUIDS: crystalloid  SPECIMENS: submucosal fibroid, endometrial curettings  VTE PROPHYLAXIS: SCDs to the bilateral lower extremities  ANTIBIOTICS: Not indicated  FLUID DEFICIT:  COMPLICATIONS: None  DISPOSITION: PACU - hemodynamically stable.  CONDITION: stable  FINDINGS: On hysteroscopy, there was a 39mm-10mm, type 2 submucosal fibroid on the posterior aspect, in the midline, just past the internal os. The fibroid appeared to be completely removed at the end of surgery. Exam under anesthesia revealed small, mobile  uterus with no masses and bilateral adnexa without masses or fullness. EGBUS, vaginal and cervix within normal limits and cervix did appear to have moderate atrophy. Patient sounded to 8.5cm Hysteroscopy also revealed a grossly normal and atrophic appearing uterine cavity, aside from the fibroid, with bilateral tubal ostia and normal appearing endocervical canal.  PROCEDURE IN DETAIL:  After informed consent was obtained, the patient was taken to the operating room where anesthesia was obtained without difficulty. The patient was positioned in the dorsal lithotomy position in [place] stirrups.  The patient's bladder was catheterized with an in and out foley catheter.  The patient was examined under anesthesia, with the above noted findings.  The bi-valved speculum was placed inside the patient's vagina, and the the anterior lip of the cervix was seen and grasped with the tenaculum.  A paracervical block was achieved with 20mL 1% lidocaine.  The uterine cavity was sounded to 8.5cm,  and then the cervix was progressively dilated to a 21 French-Pratt dilator.  The hysteroscope was introduced, with the above noted findings and the myomectomy done without issue. The hystersocope was removed and the uterine cavity was curetted until a gritty texture was noted, yielding scant endometrial curettings.  Repeat hysteroscopy done and excellent hemostasis was noted, and all instruments were removed, with excellent hemostasis noted throughout.  She was then taken out of dorsal lithotomy. The patient tolerated the procedure well.  Sponge, lap and instrument counts were correct x2.  The patient was taken to recovery room in excellent condition.  Cornelia Copa MD Attending [facility])

## 2022-08-08 NOTE — Discharge Instructions (Addendum)
   D & C Home care Instructions:   Personal hygiene:  Used sanitary napkins for vaginal drainage not tampons. Shower or tub bathe the day after your procedure. No douching until bleeding stops. Always wipe from front to back after  Elimination.  Activity: Do not drive or operate any equipment today. The effects of the anesthesia are still present and drowsiness may result. Rest today, not necessarily flat bed rest, just take it easy. You may resume your normal activity in one to 2 days.  Sexual activity: No intercourse for one week or as indicated by your physician  Diet: Eat a light diet as desired this evening. You may resume a regular diet tomorrow.  General Expectations of your surgery: Vaginal bleeding should be no heavier than a normal period. Spotting may continue up to 10 days. Mild cramps may continue for a couple of days. You may have a regular period in 2-6 weeks.  Unexpected observations call your doctor if these occur: persistent or heavy bleeding. Severe abdominal cramping or pain. Elevation of temperature greater than 100F.  Call for an appointment in one week.

## 2022-08-08 NOTE — Progress Notes (Signed)
GYN Telephone Note Patient's partner not in waiting room so he was called and discussed with him re: surgery and surgical findings.   Cornelia Copa MD Attending [facility] (Faculty Practice) 08/08/2022 Time: 1149am

## 2022-08-09 ENCOUNTER — Encounter (HOSPITAL_BASED_OUTPATIENT_CLINIC_OR_DEPARTMENT_OTHER): Payer: Self-pay | Admitting: Obstetrics and Gynecology

## 2022-08-09 ENCOUNTER — Telehealth: Payer: Self-pay | Admitting: Nurse Practitioner

## 2022-08-09 LAB — SURGICAL PATHOLOGY

## 2022-08-09 NOTE — Telephone Encounter (Signed)
Called pt and left a message according to pt prescription pt have more refills for this medication advise pt to call pharmacy and verified. Gh

## 2022-08-09 NOTE — Telephone Encounter (Signed)
Caller & Relationship to patient:  MRN #  [id number]   Call Back Number:   Date of Last Office Visit: 07/04/2022     Date of Next Office Visit: 09/17/2022    Medication(s) to be Refilled: Cetrizine  Preferred Pharmacy:   ** Please notify patient to allow 48-72 hours to process** **Let patient know to contact pharmacy at the end of the day to make sure medication is ready. ** **If patient has not been seen in a year or longer, book an appointment **Advise to use MyChart for refill requests OR to contact their pharmacy

## 2022-08-09 NOTE — Anesthesia Postprocedure Evaluation (Signed)
Anesthesia Post Note  Patient: Brenda Bell  Procedure(s) Performed: DILATATION & CURETTAGE/HYSTEROSCOPY WITH MYOSURE MYOMECTOMY (Vagina )     Patient location during evaluation: PACU Anesthesia Type: General Level of consciousness: awake and alert Pain management: pain level controlled Vital Signs Assessment: post-procedure vital signs reviewed and stable Respiratory status: spontaneous breathing, nonlabored ventilation, respiratory function stable and patient connected to nasal cannula oxygen Cardiovascular status: blood pressure returned to baseline and stable Postop Assessment: no apparent nausea or vomiting Anesthetic complications: no   No notable events documented.  Last Vitals:  Vitals:   08/08/22 1148 08/08/22 1306  BP:  120/85  Pulse: 88 73  Resp: 14 15  Temp: (!) 36.3 C   SpO2: 97% 99%    Last Pain:  Vitals:   08/08/22 1306  TempSrc:   PainSc: 0-No pain                 [name] S

## 2022-08-10 LAB — ESTRADIOL: Estradiol: 9.1 pg/mL

## 2022-08-10 LAB — FOLLICLE STIMULATING HORMONE: FSH: 54.3 m[IU]/mL

## 2022-09-10 ENCOUNTER — Other Ambulatory Visit: Payer: Self-pay

## 2022-09-10 ENCOUNTER — Encounter: Payer: Self-pay | Admitting: Obstetrics and Gynecology

## 2022-09-10 ENCOUNTER — Ambulatory Visit (INDEPENDENT_AMBULATORY_CARE_PROVIDER_SITE_OTHER): Payer: 59 | Admitting: Obstetrics and Gynecology

## 2022-09-10 VITALS — BP 125/86 | HR 75 | Wt 203.1 lb

## 2022-09-10 DIAGNOSIS — Z758 Other problems related to medical facilities and other health care: Secondary | ICD-10-CM | POA: Diagnosis not present

## 2022-09-10 DIAGNOSIS — Z09 Encounter for follow-up examination after completed treatment for conditions other than malignant neoplasm: Secondary | ICD-10-CM | POA: Diagnosis not present

## 2022-09-10 DIAGNOSIS — Z603 Acculturation difficulty: Secondary | ICD-10-CM

## 2022-09-10 DIAGNOSIS — Z86018 Personal history of other benign neoplasm: Secondary | ICD-10-CM | POA: Diagnosis not present

## 2022-09-10 NOTE — Progress Notes (Signed)
Obstetrics and Gynecology Visit Return Patient Evaluation  Appointment Date: 09/10/2022  Primary Care Provider: Lesleigh Noe [facility]: [facility]  Chief Complaint: routine postop check  History of Present Illness:  Brenda Bell is a 53 y.o. s/p 5/8 hysteroscopy, d&c, myosure submucosal myomectomy for PMB. Final pathology came back scant, benign inactive endometrium and benign fibroid. Confirmatory labs are consistent with being in menopause  Interval History: Since that time, she states that she is doing well and no issues or problems and feels better  Review of Systems: Pas noted in the History of Present Illness.   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 had no medications administered during this visit. Current Outpatient Medications  Medication Sig Dispense Refill   cetirizine (ZYRTEC) 10 MG tablet Take 1 tablet (10 mg total) by mouth daily. 30 tablet 11   omeprazole (PRILOSEC) 20 MG capsule Take 1 capsule (20 mg total) by mouth daily. 30 capsule 3   acetaminophen (TYLENOL) 500 MG tablet Take 2 tablets (1,000 mg total) by mouth every 6 (six) hours as needed. (Patient not taking: Reported on 09/10/2022) 30 tablet 0   albuterol (VENTOLIN HFA) 108 (90 Base) MCG/ACT inhaler Inhale 1-2 puffs into the lungs every 6 (six) hours as needed for wheezing or shortness of breath. (Patient not taking: Reported on 08/03/2022) 18 g 0   ibuprofen (MOTRIN IB) 200 MG tablet Take 3 tablets (600 mg total) by mouth every 6 (six) hours as needed. (Patient not  taking: Reported on 09/10/2022) 30 tablet 0   ondansetron (ZOFRAN-ODT) 4 MG disintegrating tablet Take 1 tablet (4 mg total) by mouth every 8 (eight) hours as needed for nausea or vomiting. (Patient not taking: Reported on 09/10/2022) 20 tablet 0   No current facility-administered medications for this visit.    Allergies: is allergic to hydrocodone.  Physical Exam:  BP 125/86   Pulse 75   Wt 203 lb 1.6 oz (92.1 kg)   BMI 33.80 kg/m  Body mass index is 33.8 kg/m. General appearance: Well nourished, well developed female in no acute distress.  Neuro/Psych:  Normal mood and affect.     Assessment: patient doing well  Plan:  1. Language barrier In person interpreter used  2. History of uterine fibroid D/w her re: operative findings and labs. I told her if any repeat bleeding in the future that this is not normal and to let us know. Recommend continue with routine surveillance paps every 3-5 years   RTC: PRN  Return if symptoms worsen or fail to improve.  Future Appointments  Date Time Provider Department Center  09/17/2022  3:00 PM Ivonne Andrew, NP [facility] None    Cornelia Copa MD Attending [facility])

## 2022-09-17 ENCOUNTER — Ambulatory Visit: Payer: Self-pay | Admitting: Nurse Practitioner

## 2022-11-16 ENCOUNTER — Encounter: Payer: Self-pay | Admitting: Nurse Practitioner

## 2022-11-16 ENCOUNTER — Ambulatory Visit (INDEPENDENT_AMBULATORY_CARE_PROVIDER_SITE_OTHER): Payer: 59 | Admitting: Nurse Practitioner

## 2022-11-16 VITALS — BP 119/82 | HR 85 | Temp 98.4°F | Ht 63.5 in | Wt 200.0 lb

## 2022-11-16 DIAGNOSIS — Z131 Encounter for screening for diabetes mellitus: Secondary | ICD-10-CM | POA: Diagnosis not present

## 2022-11-16 DIAGNOSIS — R600 Localized edema: Secondary | ICD-10-CM

## 2022-11-16 DIAGNOSIS — I8390 Asymptomatic varicose veins of unspecified lower extremity: Secondary | ICD-10-CM

## 2022-11-16 NOTE — Progress Notes (Unsigned)
Pt right eye, is red. States  Saturday it was worst and today is better.   Pt states her feeet at night hurt, but she feels really hot and pain.   Pt states high blood pressure.

## 2022-11-16 NOTE — Progress Notes (Unsigned)
@Patient  ID: Brenda Bell, female    DOB: 25-Aug-1970, 52 y.o.   MRN: [id number]  No chief complaint on file.   Referring provider: Ivonne Andrew, NP   HPI        Allergies  Allergen Reactions   Hydrocodone Nausea And Vomiting    And decreased b/p    Immunization History  Administered Date(s) Administered   Influenza Split 01/29/2013   Influenza Whole 01/17/2007   Influenza, Quadrivalent, Recombinant, Inj, Pf 02/02/2019   Influenza,inj,Quad PF,6+ Mos 01/20/2015, 02/09/2016, 02/25/2017, 01/23/2018, 05/09/2022   Influenza-Unspecified 02/14/2021   Pneumococcal Polysaccharide-23 01/17/2007   Td 06/01/2006, 06/18/2006   Tdap 12/25/2012    Past Medical History:  Diagnosis Date   Abnormal uterine bleeding (AUB)    Anemia, iron deficiency 08/20/2013   Asthma    B12 deficiency    Cervical stenosis (uterine cervix) 07/30/2022   Complication of anesthesia    History of gastritis 2017   Hypotension after procedure    Pre-diabetes    Stenosis of cervix    Uterine fibroid    Vitamin D deficiency     Tobacco History: Social History   Tobacco Use  Smoking Status Never  Smokeless Tobacco Never   Counseling given: Not Answered   Outpatient Encounter Medications as of 11/16/2022  Medication Sig   cetirizine (ZYRTEC) 10 MG tablet Take 1 tablet (10 mg total) by mouth daily.   acetaminophen (TYLENOL) 500 MG tablet Take 2 tablets (1,000 mg total) by mouth every 6 (six) hours as needed. (Patient not taking: Reported on 09/10/2022)   albuterol (VENTOLIN HFA) 108 (90 Base) MCG/ACT inhaler Inhale 1-2 puffs into the lungs every 6 (six) hours as needed for wheezing or shortness of breath. (Patient not taking: Reported on 08/03/2022)   ibuprofen (MOTRIN IB) 200 MG tablet Take 3 tablets (600 mg total) by mouth every 6 (six) hours as needed. (Patient not taking: Reported on 09/10/2022)   omeprazole (PRILOSEC) 20 MG capsule Take 1 capsule (20 mg total) by mouth daily. (Patient not  taking: Reported on 11/16/2022)   ondansetron (ZOFRAN-ODT) 4 MG disintegrating tablet Take 1 tablet (4 mg total) by mouth every 8 (eight) hours as needed for nausea or vomiting. (Patient not taking: Reported on 09/10/2022)   No facility-administered encounter medications on file as of 11/16/2022.     Review of Systems  Review of Systems     Physical Exam  BP 119/82   Pulse 85   Temp 98.4 F (36.9 C) (Oral)   Ht 5' 3.5" (1.613 m)   Wt 200 lb (90.7 kg)   SpO2 98%   BMI 34.87 kg/m   Wt Readings from Last 5 Encounters:  11/16/22 200 lb (90.7 kg)  09/10/22 203 lb 1.6 oz (92.1 kg)  08/08/22 199 lb 14.4 oz (90.7 kg)  07/30/22 205 lb 11.2 oz (93.3 kg)  07/10/22 201 lb 9.6 oz (91.4 kg)     Physical Exam   Lab Results:  CBC    Component Value Date/Time   WBC 4.6 06/29/2022 0833   RBC 4.71 06/29/2022 0833   HGB 14.0 06/29/2022 0833   HGB 14.2 11/06/2021 0931   HCT 42.9 06/29/2022 0833   HCT 42.7 11/06/2021 0931   PLT 207 06/29/2022 0833   PLT 192 11/06/2021 0931   MCV 91.1 06/29/2022 0833   MCV 92 11/06/2021 0931   MCH 29.7 06/29/2022 0833   MCHC 32.6 06/29/2022 0833   RDW 12.8 06/29/2022 0833   RDW 13.3 11/06/2021 0931  LYMPHSABS 0.5 (L) 06/29/2022 0833   LYMPHSABS 1.7 04/04/2021 0954   MONOABS 0.5 06/29/2022 0833   EOSABS 0.0 06/29/2022 0833   EOSABS 0.1 04/04/2021 0954   BASOSABS 0.0 06/29/2022 0833   BASOSABS 0.0 04/04/2021 0954    BMET    Component Value Date/Time   NA 133 (L) 06/29/2022 0833   NA 142 11/06/2021 0931   K 3.2 (L) 06/29/2022 0833   CL 105 06/29/2022 0833   CO2 19 (L) 06/29/2022 0833   GLUCOSE 126 (H) 06/29/2022 0833   BUN 12 06/29/2022 0833   BUN 9 11/06/2021 0931   CREATININE 0.46 06/29/2022 0833   CREATININE 0.44 (L) 01/20/2015 1358   CALCIUM 8.4 (L) 06/29/2022 0833   GFRNONAA >60 06/29/2022 0833   GFRAA 135 03/04/2019 1102    BNP No results found for: "BNP"  ProBNP No results found for: "PROBNP"  Imaging: No results  found.   Assessment & Plan:   No problem-specific Assessment & Plan notes found for this encounter.     Ivonne Andrew, NP 11/16/2022

## 2022-11-17 LAB — COMPREHENSIVE METABOLIC PANEL
ALT: 44 IU/L — ABNORMAL HIGH (ref 0–32)
AST: 43 IU/L — ABNORMAL HIGH (ref 0–40)
Albumin: 4.3 g/dL (ref 3.9–4.9)
Alkaline Phosphatase: 78 IU/L (ref 44–121)
BUN/Creatinine Ratio: 13 (ref 9–23)
BUN: 8 mg/dL (ref 6–24)
Bilirubin Total: 0.4 mg/dL (ref 0.0–1.2)
CO2: 24 mmol/L (ref 20–29)
Calcium: 9.2 mg/dL (ref 8.7–10.2)
Chloride: 105 mmol/L (ref 96–106)
Creatinine, Ser: 0.61 mg/dL (ref 0.57–1.00)
Globulin, Total: 3.1 g/dL (ref 1.5–4.5)
Glucose: 105 mg/dL — ABNORMAL HIGH (ref 70–99)
Potassium: 4.1 mmol/L (ref 3.5–5.2)
Sodium: 141 mmol/L (ref 134–144)
Total Protein: 7.4 g/dL (ref 6.0–8.5)
eGFR: 110 mL/min/{1.73_m2} (ref >59)

## 2022-11-17 LAB — CBC
Hematocrit: 37.9 % (ref 34.0–46.6)
Hemoglobin: 12.6 g/dL (ref 11.1–15.9)
MCH: 29 pg (ref 26.6–33.0)
MCHC: 33.2 g/dL (ref 31.5–35.7)
MCV: 87 fL (ref 79–97)
Platelets: 199 10*3/uL (ref 150–450)
RBC: 4.35 x10E6/uL (ref 3.77–5.28)
RDW: 14.2 % (ref 11.7–15.4)
WBC: 4.1 10*3/uL (ref 3.4–10.8)

## 2022-11-17 LAB — HEMOGLOBIN A1C
Est. average glucose Bld gHb Est-mCnc: 123 mg/dL
Hgb A1c MFr Bld: 5.9 % — ABNORMAL HIGH (ref 4.8–5.6)

## 2022-11-22 ENCOUNTER — Encounter: Payer: Self-pay | Admitting: Nurse Practitioner

## 2022-11-22 DIAGNOSIS — R600 Localized edema: Secondary | ICD-10-CM | POA: Insufficient documentation

## 2022-11-22 NOTE — Assessment & Plan Note (Signed)
-   Ambulatory referral to Vascular Surgery - CBC - Comprehensive metabolic panel  2. Varicose veins of calf  - Ambulatory referral to Vascular Surgery  3. Diabetes mellitus screening  - Hemoglobin A1c  Follow up:  Follow up in 3 months

## 2022-11-22 NOTE — Patient Instructions (Signed)
1. Peripheral edema  - Ambulatory referral to Vascular Surgery - CBC - Comprehensive metabolic panel  2. Varicose veins of calf  - Ambulatory referral to Vascular Surgery  3. Diabetes mellitus screening  - Hemoglobin A1c  Follow up:  Follow up in 3 months

## 2022-11-26 ENCOUNTER — Encounter (HOSPITAL_COMMUNITY): Payer: Self-pay

## 2022-11-26 ENCOUNTER — Ambulatory Visit (HOSPITAL_COMMUNITY)
Admission: EM | Admit: 2022-11-26 | Discharge: 2022-11-26 | Disposition: A | Discharge status: Home / Self Care | Payer: 59 | Attending: Internal Medicine | Admitting: Internal Medicine

## 2022-11-26 DIAGNOSIS — R300 Dysuria: Secondary | ICD-10-CM | POA: Diagnosis present

## 2022-11-26 DIAGNOSIS — R1032 Left lower quadrant pain: Secondary | ICD-10-CM | POA: Diagnosis present

## 2022-11-26 DIAGNOSIS — R7309 Other abnormal glucose: Secondary | ICD-10-CM | POA: Diagnosis present

## 2022-11-26 DIAGNOSIS — R1031 Right lower quadrant pain: Secondary | ICD-10-CM | POA: Insufficient documentation

## 2022-11-26 LAB — POCT URINALYSIS DIP (MANUAL ENTRY)
Bilirubin, UA: NEGATIVE
Blood, UA: NEGATIVE
Glucose, UA: NEGATIVE mg/dL
Ketones, POC UA: NEGATIVE mg/dL
Nitrite, UA: NEGATIVE
Protein Ur, POC: NEGATIVE mg/dL
Spec Grav, UA: 1.015 (ref 1.010–1.025)
Urobilinogen, UA: 0.2 E.U./dL
pH, UA: 7 (ref 5.0–8.0)

## 2022-11-26 MED ORDER — NITROFURANTOIN MONOHYD MACRO 100 MG PO CAPS: 100.0000 mg | ORAL_CAPSULE | Freq: Two times a day (BID) | ORAL | 0 refills | Status: AC

## 2022-11-26 NOTE — Discharge Instructions (Signed)
Se recomienda comenzar con el antibitico Macrobid 100 mg [employer] al da durante 5 das; tmelo con alimentos. Contine bebiendo [employer]. Puede tomar ibuprofeno de venta libre, 600 mg cada 8 horas, segn sea necesario para [employer]. Seguimiento pendiente de los resultados del urocultivo y con su Pro[employer] segn lo previsto.  Recommend start Macrobid antibiotic 100mg  twice a day for 5 days - take with food. Continue to drink plenty of water. May take OTC Ibuprofen 600mg  every 8 hours as needed for pain. Follow-up pending urine culture results and with your Primary Care Provider as planned.

## 2022-11-26 NOTE — ED Provider Notes (Signed)
[facility]    CSN: [id number] Arrival date & time: 11/26/22  4696      History   Chief Complaint Chief Complaint  Patient presents with   Dysuria    HPI Brenda Bell is a 52 y.o. female.   52 year old female accompanied by a family member presents with burning with urination.   The history is provided by the patient. A language interpreter was used.  Dysuria   Past Medical History:  Diagnosis Date   Abnormal uterine bleeding (AUB)    Anemia, iron deficiency 08/20/2013   Asthma    B12 deficiency    Cervical stenosis (uterine cervix) 07/30/2022   Complication of anesthesia    History of gastritis 2017   Hypotension after procedure    Pre-diabetes    Stenosis of cervix    Uterine fibroid    Vitamin D deficiency     Patient Active Problem List   Diagnosis Date Noted   Peripheral edema 11/22/2022   Language barrier 08/01/2022   Pneumonia due to infectious organism 05/25/2022   Obesity with body mass index 30 or greater 04/04/2021   Neck pain 04/04/2021   Vitamin D deficiency 04/04/2021   Abnormal laboratory test result 11/22/2020   Raised TSH level 11/17/2020   Asthma, mild intermittent 12/30/2006   GERD 12/30/2006   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 Janee Morn   COLONOSCOPY WITH ESOPHAGOGASTRODUODENOSCOPY (EGD)  06/06/2015   dr pyrtle   DILATATION & CURETTAGE/HYSTEROSCOPY WITH MYOSURE N/A 08/08/2022   Procedure: DILATATION & CURETTAGE/HYSTEROSCOPY WITH MYOSURE MYOMECTOMY;  Surgeon:  Bing, MD;  Location: [facility] [place];  Service: Gynecology;  Laterality: N/A;   EXCISION OF BREAST BIOPSY Left 1998   benign   KNEE ARTHROSCOPY Right 05/25/2003   @[facility]  by dr Charlann Boxer   KNEE ARTHROSCOPY W/ SYNOVECTOMY Right 08/20/2003   @[facility]  by dr Charlann Boxer;   manipulation under anesthesia and lysis adhesions    LAPAROSCOPIC APPENDECTOMY N/A 10/26/2016   Procedure: APPENDECTOMY LAPAROSCOPIC;  Surgeon: Berna Bue, MD;  Location: [facility];  Service: General;  Laterality: N/A;   OVARY SURGERY Right    age 67;    benign tumor of right ovary removed   TONSILLECTOMY Bilateral 1995    OB History     Gravida  3   Para  3   Term  3   Preterm      AB      Living  3      SAB      IAB      Ectopic      Multiple      Live Births               Home Medications    Prior to Admission medications   Medication Sig Start Date End Date Taking? Authorizing Provider  nitrofurantoin, macrocrystal-monohydrate, (MACROBID) 100 MG capsule Take 1 capsule (100 mg total) by mouth 2 (two) times daily for 5 days. 11/26/22 12/01/22 Yes Jadwiga Faidley, Ali Lowe, NP  cetirizine (ZYRTEC) 10 MG tablet Take 1 tablet (10 mg total) by mouth daily. 05/25/22   Ivonne Andrew, NP    Family History Family History  Problem Relation Age of Onset   Hypertension Mother    Diabetes Father     Social History Social History  Tobacco Use   Smoking status: Never   Smokeless tobacco: Never  Vaping Use   Vaping status: Never Used  Substance Use Topics   Alcohol use: No    Alcohol/week: 0.0 standard drinks of alcohol   Drug use: Never     Allergies   Hydrocodone   Review of Systems Review of Systems  Genitourinary:  Positive for dysuria.     Physical Exam Triage Vital Signs ED Triage Vitals  Encounter Vitals Group     BP 11/26/22 1133 122/78     Systolic BP Percentile --      Diastolic BP Percentile --      Pulse Rate 11/26/22 1133 72     Resp 11/26/22 1133 16     Temp 11/26/22 1133 97.6 F (36.4 C)     Temp Source 11/26/22 1133 Oral     SpO2 11/26/22 1133 96 %     Weight 11/26/22 1133 202 lb (91.6 kg)     Height 11/26/22 1133 5\' 5"  (1.651 m)     Head Circumference --      Peak Flow --      Pain Score 11/26/22 1132 4     Pain Loc --      Pain Education --      Exclude from Growth  Chart --    No data found.  Updated Vital Signs BP 122/78 (BP Location: Left Arm)   Pulse 72   Temp 97.6 F (36.4 C) (Oral)   Resp 16   Ht 5\' 5"  (1.651 m)   Wt 202 lb (91.6 kg)   SpO2 96%   BMI 33.61 kg/m   Visual Acuity Right Eye Distance:   Left Eye Distance:   Bilateral Distance:    Right Eye Near:   Left Eye Near:    Bilateral Near:     Physical Exam   UC Treatments / Results  Labs (all labs ordered are listed, but only abnormal results are displayed) Labs Reviewed  POCT URINALYSIS DIP (MANUAL ENTRY) - Abnormal; Notable for the following components:      Result Value   Color, UA light yellow (*)    Leukocytes, UA Small (1+) (*)    All other components within normal limits  URINE CULTURE    EKG   Radiology No results found.  Procedures Procedures (including critical care time)  Medications Ordered in UC Medications - No data to display  Initial Impression / Assessment and Plan / UC Course  I have reviewed the triage vital signs and the nursing notes.  Pertinent labs & imaging results that were available during my care of the patient were reviewed by me and considered in my medical decision making (see chart for details).     *** Final Clinical Impressions(s) / UC Diagnoses   Final diagnoses:  Dysuria  Acute bilateral lower abdominal pain  Elevated hemoglobin A1c     Discharge Instructions      Se recomienda comenzar con el antibitico Macrobid 100 mg dos veces al da durante 5 das; tmelo con alimentos. Contine bebiendo [employer]. Puede tomar ibuprofeno de venta libre, 600 mg cada 8 horas, segn sea necesario para Chief Technology Officer. Seguimiento pendiente de los resultados del urocultivo y con su Proveed[employer] segn lo previsto.  Recommend start Macrobid antibiotic 100mg  twice a day for 5 days - take with food. Continue to drink plenty of water. May take OTC Ibuprofen 600mg  every 8 hours as needed for pain. Follow-up pending urine  culture results and with your Primary Care Provider as planned.     ED Prescriptions     Medication Sig Dispense Auth. Provider   nitrofurantoin, macrocrystal-monohydrate, (MACROBID) 100 MG capsule Take 1 capsule (100 mg total) by mouth 2 (two) times daily for 5 days. 10 capsule Kiwan Gadsden, Ali Lowe, NP      PDMP not reviewed this encounter.

## 2022-11-26 NOTE — ED Triage Notes (Signed)
Patient here today with c/o burning in urination X 3-4 days. At night the patient is worse.

## 2022-11-28 LAB — URINE CULTURE
Culture: 80000 — AB
Special Requests: NORMAL

## 2023-01-28 ENCOUNTER — Other Ambulatory Visit: Payer: Self-pay | Admitting: *Deleted

## 2023-01-28 DIAGNOSIS — R600 Localized edema: Secondary | ICD-10-CM

## 2023-02-05 ENCOUNTER — Encounter: Payer: 59 | Admitting: Vascular Surgery

## 2023-02-05 ENCOUNTER — Encounter (HOSPITAL_COMMUNITY): Payer: 59

## 2023-02-07 ENCOUNTER — Ambulatory Visit: Payer: 59 | Admitting: Physician Assistant

## 2023-02-07 ENCOUNTER — Ambulatory Visit (HOSPITAL_COMMUNITY)
Admission: RE | Admit: 2023-02-07 | Discharge: 2023-02-07 | Disposition: A | Discharge status: Home / Self Care | Payer: 59 | Source: Ambulatory Visit | Attending: Physician Assistant | Admitting: Physician Assistant

## 2023-02-07 ENCOUNTER — Encounter: Payer: Self-pay | Admitting: Physician Assistant

## 2023-02-07 VITALS — BP 119/84 | HR 82 | Temp 97.3°F

## 2023-02-07 DIAGNOSIS — R600 Localized edema: Secondary | ICD-10-CM

## 2023-02-07 DIAGNOSIS — I872 Venous insufficiency (chronic) (peripheral): Secondary | ICD-10-CM

## 2023-02-07 NOTE — Progress Notes (Signed)
Office Note     CC:  follow up Requesting Provider:  Ivonne Andrew, NP  HPI: Brenda Bell is a 52 y.o. ([birth year]) female who presents for evaluation of left lower extremity edema.  Patient was referred here by her PCP.  An interpreter was used during today's visit.  The patient complains of pain in both calves ankles and heels.  Pain is worse in the morning however improves over the course of the day.  She is not bothered by edema of either lower extremity.  She does not wear compression or elevate her legs.  She has no history of DVT, venous ulcerations, trauma, or prior vascular interventions.  She denies tobacco use.  Recent hemoglobin A1c of 5.9.   Past Medical History:  Diagnosis Date   Abnormal uterine bleeding (AUB)    Anemia, iron deficiency 08/20/2013   Asthma    B12 deficiency    Cervical stenosis (uterine cervix) 07/30/2022   Complication of anesthesia    History of gastritis 2017   Hypotension after procedure    Pre-diabetes    Stenosis of cervix    Uterine fibroid    Vitamin D deficiency     Past Surgical History:  Procedure Laterality Date   CESAREAN SECTION     x3  last one 1997   CHOLECYSTECTOMY, LAPAROSCOPIC  10/18/2006   @[facility]  by dr b. Janee Morn   COLONOSCOPY WITH ESOPHAGOGASTRODUODENOSCOPY (EGD)  06/06/2015   dr pyrtle   DILATATION & CURETTAGE/HYSTEROSCOPY WITH MYOSURE N/A 08/08/2022   Procedure: DILATATION & CURETTAGE/HYSTEROSCOPY WITH MYOSURE MYOMECTOMY;  Surgeon: Dunn Bing, MD;  Location: [facility];  Service: Gynecology;  Laterality: N/A;   EXCISION OF BREAST BIOPSY Left 1998   benign   KNEE ARTHROSCOPY Right 05/25/2003   @[facility]  by dr Charlann Boxer   KNEE ARTHROSCOPY W/ SYNOVECTOMY Right 08/20/2003   @[facility]  by dr Charlann Boxer;   manipulation under anesthesia and lysis adhesions   LAPAROSCOPIC APPENDECTOMY N/A 10/26/2016   Procedure: APPENDECTOMY LAPAROSCOPIC;  Surgeon: Berna Bue, MD;  Location: [facility] OR;  Service: General;  Laterality: N/A;    OVARY SURGERY Right    age 35;    benign tumor of right ovary removed   TONSILLECTOMY Bilateral 1995    Social History   Socioeconomic History   Marital status: Married    Spouse name: Not on file   Number of children: Not on file   Years of education: Not on file   Highest education level: Not on file  Occupational History   Not on file  Tobacco Use   Smoking status: Never   Smokeless tobacco: Never  Vaping Use   Vaping status: Never Used  Substance and Sexual Activity   Alcohol use: No    Alcohol/week: 0.0 standard drinks of alcohol   Drug use: Never   Sexual activity: Yes  Other Topics Concern   Not on file  Social History Narrative   Not on file   Social Determinants of Health   Financial Resource Strain: Not on file  Food Insecurity: No Food Insecurity (07/10/2022)   Hunger Vital Sign    Worried About Running Out of Food in the Last Year: Never true    Ran Out of Food in the Last Year: Never true  Transportation Needs: No Transportation Needs (07/10/2022)   PRAPARE - Administrator, Civil Service (Medical): No    Lack of Transportation (Non-Medical): No  Physical Activity: Not on file  Stress: Not on file  Social  Connections: Not on file  Intimate Partner Violence: Not on file    Family History  Problem Relation Age of Onset   Hypertension Mother    Diabetes Father     Current Outpatient Medications  Medication Sig Dispense Refill   cetirizine (ZYRTEC) 10 MG tablet Take 1 tablet (10 mg total) by mouth daily. 30 tablet 11   No current facility-administered medications for this visit.    Allergies  Allergen Reactions   Hydrocodone Nausea And Vomiting    And decreased b/p     REVIEW OF SYSTEMS:   [X]  denotes positive finding, [ ]  denotes negative finding Cardiac  Comments:  Chest pain or chest pressure:    Shortness of breath upon exertion:    Short of breath when lying flat:    Irregular heart rhythm:        Vascular    Pain in  calf, thigh, or hip brought on by ambulation:    Pain in feet at night that wakes you up from your sleep:     Blood clot in your veins:    Leg swelling:         Pulmonary    Oxygen at home:    Productive cough:     Wheezing:         Neurologic    Sudden weakness in arms or legs:     Sudden numbness in arms or legs:     Sudden onset of difficulty speaking or slurred speech:    Temporary loss of vision in one eye:     Problems with dizziness:         Gastrointestinal    Blood in stool:     Vomited blood:         Genitourinary    Burning when urinating:     Blood in urine:        Psychiatric    Major depression:         Hematologic    Bleeding problems:    Problems with blood clotting too easily:        Skin    Rashes or ulcers:        Constitutional    Fever or chills:      PHYSICAL EXAMINATION:  There were no vitals filed for this visit.  General:  WDWN in NAD; vital signs documented above Gait: Not observed HENT: WNL, normocephalic Pulmonary: normal non-labored breathing , without Rales, rhonchi,  wheezing Cardiac: regular HR Abdomen: soft, NT, no masses Skin: without rashes Vascular Exam/Pulses: Palpable PT pulses bilaterally Extremities: without ischemic changes, without Gangrene , without cellulitis; without open wounds; slightly more swelling in the left ankle compared to right however no venous ulcerations, hyperpigmentation Musculoskeletal: no muscle wasting or atrophy  Neurologic: A&O X 3 Psychiatric:  The pt has Normal affect.   Non-Invasive Vascular Imaging:   LEFT          Reflux NoRefluxReflux TimeDiameter cmsComments                          Yes                                   +--------------+---------+------+-----------+------------+--------+  CFV                    yes   >1 second                       +--------------+---------+------+-----------+------------+--------+  FV prox       no                                               +--------------+---------+------+-----------+------------+--------+  FV mid        no                                              +--------------+---------+------+-----------+------------+--------+  FV dist       no                                              +--------------+---------+------+-----------+------------+--------+  Popliteal              yes   >1 second                       +--------------+---------+------+-----------+------------+--------+  GSV at SFJ    no                           0.432              +--------------+---------+------+-----------+------------+--------+  GSV prox thighno                           0.338              +--------------+---------+------+-----------+------------+--------+  GSV mid thigh no                           0.356              +--------------+---------+------+-----------+------------+--------+  GSV dist thigh          yes    >500 ms     0.324              +--------------+---------+------+-----------+------------+--------+  GSV at knee             yes    >500 ms     0.306              +--------------+---------+------+-----------+------------+--------+  GSV prox calf           yes    >500 ms     0.279              +--------------+---------+------+-----------+------------+--------+  SSV Pop Fossa no                            0.5               +--------------+---------+------+-----------+------------+--------+  SSV prox calf no                            0.24              +--------------+---------+------+-----------+------------+--------+  SSV mid calf  no                            0.26  ASSESSMENT/PLAN:: 52 y.o. female referred for evaluation of left lower extremity edema.  Patient however is complaining of pain in both lower legs  Brenda Bell is a 52 year old female who is complaining today of pain in both lower legs which is  worse in the morning compared to in the afternoon or evening.  She has slightly more swelling of the left ankle than the right however this does not bother her.  Patient was referred here for evaluation of left lower extremity edema.  Left lower extremity reflux study was negative for DVT.  She has some areas of incompetent valves in the deep and superficial system.  We discussed treatment for left lower extremity edema should this become symptomatic in the future.  Recommendations included regular use of compression stockings, periodic elevation of the legs, and avoiding prolonged sitting and standing.  She will be referred back to her PCP for further evaluation of bilateral lower leg pain and discomfort.   Emilie Rutter, PA-C [facility] [phone]  Clinic MD:   Karin Lieu

## 2023-05-21 ENCOUNTER — Ambulatory Visit (HOSPITAL_COMMUNITY)
Admission: EM | Admit: 2023-05-21 | Discharge: 2023-05-21 | Disposition: A | Discharge status: Home / Self Care | Payer: 59 | Attending: Physician Assistant | Admitting: Physician Assistant

## 2023-05-21 ENCOUNTER — Other Ambulatory Visit: Payer: Self-pay

## 2023-05-21 ENCOUNTER — Encounter (HOSPITAL_COMMUNITY): Payer: Self-pay | Admitting: Emergency Medicine

## 2023-05-21 DIAGNOSIS — J4521 Mild intermittent asthma with (acute) exacerbation: Secondary | ICD-10-CM | POA: Diagnosis not present

## 2023-05-21 DIAGNOSIS — J069 Acute upper respiratory infection, unspecified: Secondary | ICD-10-CM | POA: Diagnosis not present

## 2023-05-21 LAB — POC COVID19/FLU A&B COMBO
Covid Antigen, POC: NEGATIVE
Influenza A Antigen, POC: NEGATIVE
Influenza B Antigen, POC: NEGATIVE

## 2023-05-21 MED ORDER — ALBUTEROL SULFATE HFA 108 (90 BASE) MCG/ACT IN AERS: 1.0000 | INHALATION_SPRAY | Freq: Four times a day (QID) | RESPIRATORY_TRACT | 0 refills | Status: DC | PRN

## 2023-05-21 MED ORDER — FLUTICASONE-SALMETEROL 115-21 MCG/ACT IN AERO: 2.0000 | INHALATION_SPRAY | Freq: Two times a day (BID) | RESPIRATORY_TRACT | 0 refills | Status: DC

## 2023-05-21 MED ORDER — PROMETHAZINE-DM 6.25-15 MG/5ML PO SYRP: 5.0000 mL | ORAL_SOLUTION | Freq: Four times a day (QID) | ORAL | 0 refills | Status: AC | PRN

## 2023-05-21 NOTE — Discharge Instructions (Addendum)
 Your testing was negative for flu and COVID.  At this time I suspect that you likely have a viral upper respiratory infection.  This is typically managed with over-the-counter medications such as Robitussin, Mucinex, Tylenol.  Given the fact that you also have a history of asthma I have sent in 2 inhalers for you to use to help with your chest tightness and potential exacerbation.  One of them I recommend that you take twice per day every day until your chest tightness and coughing are improving.  The other is a rescue inhaler for you to use as needed for more severe coughing episodes or shortness of breath. I have also sent in a cough syrup called promethazine-dextromethorphan.  This is a cough medication that will help with coughing fits as well as nausea associated with coughing.  It can make you a little bit sleepy so please do not take when you need to remain alert or when you are driving. If at any point you start to develop shortness of breath that is not responding or improving with your inhalers, fever, chest pain, passing out please go to the emergency room immediately for further evaluation and management.

## 2023-05-21 NOTE — ED Triage Notes (Signed)
 Symptoms started yesterday.  Patient has a very forceful cough, reports she has concerns for bronchitis.  Patient ha had "hot " episodes. Has had a headache.    Has had dayquil and nyquil

## 2023-05-21 NOTE — ED Provider Notes (Signed)
 [facility]    CSN: [id number] Arrival date & time: 05/21/23  1456      History   Chief Complaint Chief Complaint  Patient presents with   Cough    HPI Brenda Bell is a 53 y.o. female.   HPI  She declines spanish translation services for visit   She reports she is coughing a lot and has some chest tightness She reports symptoms became worse yesterday but may have started a few days ago  She reports she has been taking Robitussin   She reports her husband was sick prior to her developing symptoms   Past Medical History:  Diagnosis Date   Abnormal uterine bleeding (AUB)    Anemia, iron deficiency 08/20/2013   Asthma    B12 deficiency    Cervical stenosis (uterine cervix) 07/30/2022   Complication of anesthesia    History of gastritis 2017   Hypotension after procedure    Pre-diabetes    Stenosis of cervix    Uterine fibroid    Vitamin D deficiency     Patient Active Problem List   Diagnosis Date Noted   Peripheral edema 11/22/2022   Language barrier 08/01/2022   Pneumonia due to infectious organism 05/25/2022   Obesity with body mass index 30 or greater 04/04/2021   Neck pain 04/04/2021   Vitamin D deficiency 04/04/2021   Abnormal laboratory test result 11/22/2020   Raised TSH level 11/17/2020   Asthma, mild intermittent 12/30/2006   GERD 12/30/2006   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 Janee Morn   COLONOSCOPY WITH ESOPHAGOGASTRODUODENOSCOPY (EGD)  06/06/2015   dr pyrtle   DILATATION & CURETTAGE/HYSTEROSCOPY WITH MYOSURE N/A 08/08/2022   Procedure: DILATATION & CURETTAGE/HYSTEROSCOPY WITH MYOSURE MYOMECTOMY;  Surgeon: Olivet Bing, MD;  Location: [facility] [place];  Service: Gynecology;  Laterality: N/A;   EXCISION OF BREAST BIOPSY Left 1998   benign   KNEE ARTHROSCOPY Right 05/25/2003    @[facility]  by dr Charlann Boxer   KNEE ARTHROSCOPY W/ SYNOVECTOMY Right 08/20/2003   @[facility]  by dr Charlann Boxer;   manipulation under anesthesia and lysis adhesions   LAPAROSCOPIC APPENDECTOMY N/A 10/26/2016   Procedure: APPENDECTOMY LAPAROSCOPIC;  Surgeon: Berna Bue, MD;  Location: [facility] OR;  Service: General;  Laterality: N/A;   OVARY SURGERY Right    age 84;    benign tumor of right ovary removed   TONSILLECTOMY Bilateral 1995    OB History     Gravida  3   Para  3   Term  3   Preterm      AB      Living  3      SAB      IAB      Ectopic      Multiple      Live Births               Home Medications    Prior to Admission medications   Medication Sig Start Date End Date Taking? Authorizing Provider  albuterol (VENTOLIN HFA) 108 (90 Base) MCG/ACT inhaler Inhale 1-2 puffs into the lungs every 6 (six) hours as needed for wheezing or shortness of breath. 05/21/23  Yes Lakevia Perris E, PA-C  fluticasone-salmeterol (ADVAIR HFA) 115-21 MCG/ACT inhaler Inhale 2 puffs into the lungs 2 (two) times daily. 05/21/23  Yes  Drewey Begue E, PA-C  promethazine-dextromethorphan (PROMETHAZINE-DM) 6.25-15 MG/5ML syrup Take 5 mLs by mouth 4 (four) times daily as needed for cough. 05/21/23  Yes Ashten Prats E, PA-C  cetirizine (ZYRTEC) 10 MG tablet Take 1 tablet (10 mg total) by mouth daily. Patient not taking: Reported on 05/21/2023 05/25/22   Ivonne Andrew, NP    Family History Family History  Problem Relation Age of Onset   Hypertension Mother    Diabetes Father     Social History Social History   Tobacco Use   Smoking status: Never   Smokeless tobacco: Never  Vaping Use   Vaping status: Never Used  Substance Use Topics   Alcohol use: No    Alcohol/week: 0.0 standard drinks of alcohol   Drug use: Never     Allergies   Hydrocodone   Review of Systems Review of Systems  Constitutional:  Positive for fatigue and fever. Negative for chills.  HENT:  Positive for sore throat. Negative  for congestion and rhinorrhea.   Respiratory:  Positive for cough and chest tightness.      Physical Exam Triage Vital Signs ED Triage Vitals  Encounter Vitals Group     BP 05/21/23 1554 129/82     Systolic BP Percentile --      Diastolic BP Percentile --      Pulse Rate 05/21/23 1554 87     Resp 05/21/23 1554 18     Temp 05/21/23 1554 99 F (37.2 C)     Temp Source 05/21/23 1554 Oral     SpO2 05/21/23 1554 96 %     Weight --      Height --      Head Circumference --      Peak Flow --      Pain Score 05/21/23 1552 8     Pain Loc --      Pain Education --      Exclude from Growth Chart --    No data found.  Updated Vital Signs BP 129/82 (BP Location: Right Arm) Comment (BP Location): large cuff  Pulse 87   Temp 99 F (37.2 C) (Oral)   Resp 18   LMP 06/08/2022   SpO2 96%   Visual Acuity Right Eye Distance:   Left Eye Distance:   Bilateral Distance:    Right Eye Near:   Left Eye Near:    Bilateral Near:     Physical Exam Vitals reviewed.  Constitutional:      General: She is awake.     Appearance: Normal appearance. She is well-developed and well-groomed. She is ill-appearing.  HENT:     Head: Normocephalic and atraumatic.     Right Ear: Hearing, tympanic membrane and ear canal normal.     Left Ear: Hearing, tympanic membrane and ear canal normal.     Mouth/Throat:     Lips: Pink.     Mouth: Mucous membranes are moist.     Pharynx: Oropharynx is clear. Uvula midline. No oropharyngeal exudate or posterior oropharyngeal erythema.  Cardiovascular:     Rate and Rhythm: Normal rate and regular rhythm.  Pulmonary:     Effort: Pulmonary effort is normal.     Breath sounds: Normal breath sounds. Decreased air movement present. No decreased breath sounds, wheezing, rhonchi or rales.  Musculoskeletal:     Cervical back: Normal range of motion and neck supple.  Lymphadenopathy:     Head:     Right side of head: No submental, submandibular or  preauricular  adenopathy.     Left side of head: No submental, submandibular or preauricular adenopathy.     Cervical:     Right cervical: No superficial cervical adenopathy.    Left cervical: No superficial cervical adenopathy.  Skin:    General: Skin is warm and dry.  Neurological:     General: No focal deficit present.     Mental Status: She is alert and oriented to person, place, and time.  Psychiatric:        Mood and Affect: Mood normal.        Behavior: Behavior normal. Behavior is cooperative.        Thought Content: Thought content normal.        Judgment: Judgment normal.      UC Treatments / Results  Labs (all labs ordered are listed, but only abnormal results are displayed) Labs Reviewed  POC COVID19/FLU A&B COMBO - Normal    EKG   Radiology No results found.  Procedures Procedures (including critical care time)  Medications Ordered in UC Medications - No data to display  Initial Impression / Assessment and Plan / UC Course  I have reviewed the triage vital signs and the nursing notes.  Pertinent labs & imaging results that were available during my care of the patient were reviewed by me and considered in my medical decision making (see chart for details).      Final Clinical Impressions(s) / UC Diagnoses   Final diagnoses:  Mild intermittent asthma with exacerbation  Viral upper respiratory tract infection   Patient appears to have an acute exacerbation of her asthma likely due to recent viral upper respiratory infection. Will provide Advair inhaler to be used twice per day as well as an albuterol rescue inhaler to assist with acute exacerbations such as coughing and shortness of breath.  Recommend using the Advair until she is back to baseline and then she can continue with albuterol as needed for acute shortness of breath.  Can continue to use over-the-counter medications for further symptomatic relief.  I did send in promethazine-DM cough syrup to further assist  with coughing.  ED and return precautions reviewed and provided in after visit summary.  Follow-up as needed for progressing or persistent symptoms    Discharge Instructions      Your testing was negative for flu and COVID.  At this time I suspect that you likely have a viral upper respiratory infection.  This is typically managed with over-the-counter medications such as Robitussin, Mucinex, Tylenol.  Given the fact that you also have a history of asthma I have sent in 2 inhalers for you to use to help with your chest tightness and potential exacerbation.  One of them I recommend that you take twice per day every day until your chest tightness and coughing are improving.  The other is a rescue inhaler for you to use as needed for more severe coughing episodes or shortness of breath. I have also sent in a cough syrup called promethazine-dextromethorphan.  This is a cough medication that will help with coughing fits as well as nausea associated with coughing.  It can make you a little bit sleepy so please do not take when you need to remain alert or when you are driving. If at any point you start to develop shortness of breath that is not responding or improving with your inhalers, fever, chest pain, passing out please go to the emergency room immediately for further evaluation and management.  ED Prescriptions     Medication Sig Dispense Auth. Provider   fluticasone-salmeterol (ADVAIR HFA) 115-21 MCG/ACT inhaler Inhale 2 puffs into the lungs 2 (two) times daily. 12 g Adryanna Friedt E, PA-C   albuterol (VENTOLIN HFA) 108 (90 Base) MCG/ACT inhaler Inhale 1-2 puffs into the lungs every 6 (six) hours as needed for wheezing or shortness of breath. 8 g Jakoby Melendrez E, PA-C   promethazine-dextromethorphan (PROMETHAZINE-DM) 6.25-15 MG/5ML syrup Take 5 mLs by mouth 4 (four) times daily as needed for cough. 118 mL Lotus Gover E, PA-C      PDMP not reviewed this encounter.   Providence Crosby,  PA-C 05/21/23 1743

## 2023-06-13 ENCOUNTER — Ambulatory Visit (INDEPENDENT_AMBULATORY_CARE_PROVIDER_SITE_OTHER): Payer: Self-pay | Admitting: Nurse Practitioner

## 2023-06-13 ENCOUNTER — Encounter: Payer: Self-pay | Admitting: Nurse Practitioner

## 2023-06-13 VITALS — BP 124/81 | HR 72 | Temp 97.7°F | Wt 205.4 lb

## 2023-06-13 DIAGNOSIS — Z13220 Encounter for screening for lipoid disorders: Secondary | ICD-10-CM

## 2023-06-13 DIAGNOSIS — Z1329 Encounter for screening for other suspected endocrine disorder: Secondary | ICD-10-CM

## 2023-06-13 DIAGNOSIS — H109 Unspecified conjunctivitis: Secondary | ICD-10-CM

## 2023-06-13 DIAGNOSIS — Z131 Encounter for screening for diabetes mellitus: Secondary | ICD-10-CM

## 2023-06-13 MED ORDER — FLUTICASONE-SALMETEROL 115-21 MCG/ACT IN AERO: 2.0000 | INHALATION_SPRAY | Freq: Two times a day (BID) | RESPIRATORY_TRACT | 0 refills | Status: AC

## 2023-06-13 MED ORDER — ERYTHROMYCIN 5 MG/GM OP OINT
1.0000 | TOPICAL_OINTMENT | Freq: Three times a day (TID) | OPHTHALMIC | 0 refills | Status: AC
Start: 2023-06-13 — End: ?

## 2023-06-13 MED ORDER — ALBUTEROL SULFATE HFA 108 (90 BASE) MCG/ACT IN AERS: 1.0000 | INHALATION_SPRAY | Freq: Four times a day (QID) | RESPIRATORY_TRACT | 0 refills | Status: DC | PRN

## 2023-06-13 NOTE — Progress Notes (Signed)
 Subjective   Patient ID: Brenda Bell, female    DOB: 03-14-1971, 53 y.o.   MRN: [id number]  Chief Complaint  Patient presents with   Medical Management of Chronic Issues    Patient stated that she woke up this morning and her left eye, today it is blood shot red    Referring provider: Ivonne Andrew, NP  Brenda Bell is a 53 y.o. female with Past Medical History: No date: Abnormal uterine bleeding (AUB) 08/20/2013: Anemia, iron deficiency No date: Asthma No date: B12 deficiency 07/30/2022: Cervical stenosis (uterine cervix) No date: Complication of anesthesia 2017: History of gastritis No date: Hypotension after procedure No date: Pre-diabetes No date: Stenosis of cervix No date: Uterine fibroid No date: Vitamin D deficiency   HPI  Patient presents today for a follow up. States that she fell yesterday woke up this morning with left eye red and irritated. No drainage. She did hit the side of her face when she fell.  Gave patient list to go to eye doctor today. Will trial erythromycin eye ointment. Patient does need labs today. Denies f/c/s, n/v/d, hemoptysis, PND, leg swelling Denies chest pain or edema     Allergies  Allergen Reactions   Hydrocodone Nausea And Vomiting    And decreased b/p    Immunization History  Administered Date(s) Administered   Influenza Split 01/29/2013   Influenza Whole 01/17/2007   Influenza, Quadrivalent, Recombinant, Inj, Pf 02/02/2019   Influenza,inj,Quad PF,6+ Mos 01/20/2015, 02/09/2016, 02/25/2017, 01/23/2018, 05/09/2022   Influenza-Unspecified 02/14/2021   Pneumococcal Polysaccharide-23 01/17/2007   Td 06/01/2006, 06/18/2006   Tdap 12/25/2012    Tobacco History: Social History   Tobacco Use  Smoking Status Never  Smokeless Tobacco Never   Counseling given: Not Answered   Outpatient Encounter Medications as of 06/13/2023  Medication Sig   cetirizine (ZYRTEC) 10 MG tablet Take 1 tablet (10 mg total) by mouth  daily.   erythromycin ophthalmic ointment Place 1 Application into the left eye 3 (three) times daily.   promethazine-dextromethorphan (PROMETHAZINE-DM) 6.25-15 MG/5ML syrup Take 5 mLs by mouth 4 (four) times daily as needed for cough.   [DISCONTINUED] albuterol (VENTOLIN HFA) 108 (90 Base) MCG/ACT inhaler Inhale 1-2 puffs into the lungs every 6 (six) hours as needed for wheezing or shortness of breath.   [DISCONTINUED] fluticasone-salmeterol (ADVAIR HFA) 115-21 MCG/ACT inhaler Inhale 2 puffs into the lungs 2 (two) times daily.   albuterol (VENTOLIN HFA) 108 (90 Base) MCG/ACT inhaler Inhale 1-2 puffs into the lungs every 6 (six) hours as needed for wheezing or shortness of breath.   fluticasone-salmeterol (ADVAIR HFA) 115-21 MCG/ACT inhaler Inhale 2 puffs into the lungs 2 (two) times daily.   No facility-administered encounter medications on file as of 06/13/2023.    Review of Systems  Review of Systems  Constitutional: Negative.   HENT: Negative.    Cardiovascular: Negative.   Gastrointestinal: Negative.   Allergic/Immunologic: Negative.   Neurological: Negative.   Psychiatric/Behavioral: Negative.       Objective:   BP 124/81   Pulse 72   Temp 97.7 F (36.5 C) (Oral)   Wt 205 lb 6.4 oz (93.2 kg)   LMP 06/08/2022   SpO2 98%   BMI 34.18 kg/m   Wt Readings from Last 5 Encounters:  06/13/23 205 lb 6.4 oz (93.2 kg)  11/26/22 202 lb (91.6 kg)  11/16/22 200 lb (90.7 kg)  09/10/22 203 lb 1.6 oz (92.1 kg)  08/08/22 199 lb 14.4 oz (90.7 kg)  Physical Exam Vitals and nursing note reviewed.  Constitutional:      General: She is not in acute distress.    Appearance: She is well-developed.  Cardiovascular:     Rate and Rhythm: Normal rate and regular rhythm.  Pulmonary:     Effort: Pulmonary effort is normal.     Breath sounds: Normal breath sounds.  Neurological:     Mental Status: She is alert and oriented to person, place, and time.       Assessment & Plan:    Conjunctivitis of left eye, unspecified conjunctivitis type -     Erythromycin; Place 1 Application into the left eye 3 (three) times daily.  Dispense: 3.5 g; Refill: 0  Thyroid disorder screen -     TSH  Lipid screening -     Lipid panel  Diabetes mellitus screening -     CBC -     Comprehensive metabolic panel -     Hemoglobin A1c  Other orders -     Albuterol Sulfate HFA; Inhale 1-2 puffs into the lungs every 6 (six) hours as needed for wheezing or shortness of breath.  Dispense: 8 g; Refill: 0 -     Fluticasone-Salmeterol; Inhale 2 puffs into the lungs 2 (two) times daily.  Dispense: 12 g; Refill: 0     Return in about 6 months (around 12/14/2023).   Ivonne Andrew, NP 06/13/2023

## 2023-06-13 NOTE — Patient Instructions (Signed)
 1. Conjunctivitis of left eye, unspecified conjunctivitis type (Primary)  - erythromycin ophthalmic ointment; Place 1 Application into the left eye 3 (three) times daily.  Dispense: 3.5 g; Refill: 0  2. Thyroid disorder screen  - TSH  3. Lipid screening  - Lipid Panel  4. Diabetes mellitus screening  - CBC - Comprehensive metabolic panel - Hemoglobin A1c

## 2023-06-14 ENCOUNTER — Other Ambulatory Visit: Payer: Self-pay | Admitting: Nurse Practitioner

## 2023-06-14 DIAGNOSIS — R7989 Other specified abnormal findings of blood chemistry: Secondary | ICD-10-CM

## 2023-06-14 LAB — LIPID PANEL
Chol/HDL Ratio: 3.4 ratio (ref 0.0–4.4)
Cholesterol, Total: 134 mg/dL (ref 100–199)
HDL: 39 mg/dL — ABNORMAL LOW (ref >39)
LDL Chol Calc (NIH): 78 mg/dL (ref 0–99)
Triglycerides: 89 mg/dL (ref 0–149)
VLDL Cholesterol Cal: 17 mg/dL (ref 5–40)

## 2023-06-14 LAB — HEMOGLOBIN A1C
Est. average glucose Bld gHb Est-mCnc: 126 mg/dL
Hgb A1c MFr Bld: 6 % — ABNORMAL HIGH (ref 4.8–5.6)

## 2023-06-14 LAB — CBC
Hematocrit: 42.8 % (ref 34.0–46.6)
Hemoglobin: 13.8 g/dL (ref 11.1–15.9)
MCH: 29.9 pg (ref 26.6–33.0)
MCHC: 32.2 g/dL (ref 31.5–35.7)
MCV: 93 fL (ref 79–97)
Platelets: 186 10*3/uL (ref 150–450)
RBC: 4.62 x10E6/uL (ref 3.77–5.28)
RDW: 13.5 % (ref 11.7–15.4)
WBC: 3.2 10*3/uL — ABNORMAL LOW (ref 3.4–10.8)

## 2023-06-14 LAB — COMPREHENSIVE METABOLIC PANEL
ALT: 41 IU/L — ABNORMAL HIGH (ref 0–32)
AST: 38 IU/L (ref 0–40)
Albumin: 4 g/dL (ref 3.9–4.9)
Alkaline Phosphatase: 73 IU/L (ref 44–121)
BUN/Creatinine Ratio: 18 (ref 9–23)
BUN: 9 mg/dL (ref 6–24)
Bilirubin Total: 0.6 mg/dL (ref 0.0–1.2)
CO2: 25 mmol/L (ref 20–29)
Calcium: 8.8 mg/dL (ref 8.7–10.2)
Chloride: 105 mmol/L (ref 96–106)
Creatinine, Ser: 0.5 mg/dL — ABNORMAL LOW (ref 0.57–1.00)
Globulin, Total: 2.8 g/dL (ref 1.5–4.5)
Glucose: 107 mg/dL — ABNORMAL HIGH (ref 70–99)
Potassium: 4.2 mmol/L (ref 3.5–5.2)
Sodium: 140 mmol/L (ref 134–144)
Total Protein: 6.8 g/dL (ref 6.0–8.5)
eGFR: 114 mL/min/{1.73_m2} (ref >59)

## 2023-06-14 LAB — TSH: TSH: 5.97 u[IU]/mL — ABNORMAL HIGH (ref 0.450–4.500)

## 2023-06-16 ENCOUNTER — Other Ambulatory Visit: Payer: Self-pay | Admitting: Nurse Practitioner

## 2023-06-16 DIAGNOSIS — J302 Other seasonal allergic rhinitis: Secondary | ICD-10-CM

## 2023-07-13 ENCOUNTER — Other Ambulatory Visit: Payer: Self-pay | Admitting: Nurse Practitioner

## 2023-08-09 ENCOUNTER — Other Ambulatory Visit: Payer: Self-pay

## 2023-08-09 DIAGNOSIS — R7989 Other specified abnormal findings of blood chemistry: Secondary | ICD-10-CM

## 2023-08-10 LAB — THYROID PANEL WITH TSH
Free Thyroxine Index: 1.9 (ref 1.2–4.9)
T3 Uptake Ratio: 27 % (ref 24–39)
T4, Total: 6.9 ug/dL (ref 4.5–12.0)
TSH: 11.3 u[IU]/mL — ABNORMAL HIGH (ref 0.450–4.500)

## 2023-08-12 ENCOUNTER — Other Ambulatory Visit: Payer: Self-pay | Admitting: Nurse Practitioner

## 2023-08-12 DIAGNOSIS — E038 Other specified hypothyroidism: Secondary | ICD-10-CM

## 2023-08-12 MED ORDER — LEVOTHYROXINE SODIUM 50 MCG PO TABS: 50.0000 ug | ORAL_TABLET | Freq: Every day | ORAL | 11 refills | Status: DC

## 2023-08-13 ENCOUNTER — Ambulatory Visit: Payer: Self-pay

## 2023-08-14 ENCOUNTER — Ambulatory Visit: Admitting: Nurse Practitioner

## 2023-08-14 ENCOUNTER — Encounter: Payer: Self-pay | Admitting: Nurse Practitioner

## 2023-08-14 VITALS — BP 132/87 | HR 70 | Temp 98.4°F | Wt 203.0 lb

## 2023-08-14 DIAGNOSIS — R7989 Other specified abnormal findings of blood chemistry: Secondary | ICD-10-CM

## 2023-08-14 MED ORDER — FLUTICASONE PROPIONATE 50 MCG/ACT NA SUSP: 2.0000 | Freq: Every day | NASAL | 6 refills | Status: AC

## 2023-08-14 NOTE — Patient Instructions (Signed)
 1. Elevated TSH (Primary)  Continue synthroid  Follow up:  Follow up as scheduled

## 2023-08-14 NOTE — Progress Notes (Signed)
 Subjective   Patient ID: Brenda Bell, female    DOB: 07/13/1970, 53 y.o.   MRN: [id number]  Chief Complaint  Patient presents with   Medical Management of Chronic Issues    Referring provider: Jerrlyn Morel, NP  Brenda Bell is a 53 y.o. female with Past Medical History: No date: Abnormal uterine bleeding (AUB) 08/20/2013: Anemia, iron  deficiency No date: Asthma No date: B12 deficiency 07/30/2022: Cervical stenosis (uterine cervix) No date: Complication of anesthesia 2017: History of gastritis No date: Hypotension after procedure No date: Pre-diabetes No date: Stenosis of cervix No date: Uterine fibroid No date: Vitamin D  deficiency  HPI  Patient presents today for a follow-up visit.  Overall she has been doing well.  At last visit here her thyroid  was abnormal.  She was prescribed Synthroid but states that she just started taking the medicine yesterday.  She does have an upcoming appointment in June and we will recheck thyroid  levels at that visit. Denies f/c/s, n/v/d, hemoptysis, PND, leg swelling Denies chest pain or edema      Allergies  Allergen Reactions   Hydrocodone  Nausea And Vomiting    And decreased b/p    Immunization History  Administered Date(s) Administered   Influenza Split 01/29/2013   Influenza Whole 01/17/2007   Influenza, Quadrivalent, Recombinant, Inj, Pf 02/02/2019   Influenza,inj,Quad PF,6+ Mos 01/20/2015, 02/09/2016, 02/25/2017, 01/23/2018, 05/09/2022   Influenza-Unspecified 02/14/2021   Pneumococcal Polysaccharide-23 01/17/2007   Td 06/01/2006, 06/18/2006   Tdap 12/25/2012    Tobacco History: Social History   Tobacco Use  Smoking Status Never  Smokeless Tobacco Never   Counseling given: Not Answered   Outpatient Encounter Medications as of 08/14/2023  Medication Sig   albuterol  (VENTOLIN  HFA) 108 (90 Base) MCG/ACT inhaler INHALE 1-2 PUFFS INTO THE LUNGS UP TO EVERY 6 HOURS AS NEEDED FOR WHEEZING/SHORTNESS OF BREATH    cetirizine  (ZYRTEC ) 10 MG tablet TAKE 1 TABLET BY MOUTH EVERY DAY   fluticasone  (FLONASE ) 50 MCG/ACT nasal spray Place 2 sprays into both nostrils daily.   fluticasone -salmeterol (ADVAIR HFA) 115-21 MCG/ACT inhaler Inhale 2 puffs into the lungs 2 (two) times daily.   levothyroxine (SYNTHROID) 50 MCG tablet Take 1 tablet (50 mcg total) by mouth daily.   promethazine -dextromethorphan (PROMETHAZINE -DM) 6.25-15 MG/5ML syrup Take 5 mLs by mouth 4 (four) times daily as needed for cough.   erythromycin  ophthalmic ointment Place 1 Application into the left eye 3 (three) times daily. (Patient not taking: Reported on 08/14/2023)   No facility-administered encounter medications on file as of 08/14/2023.    Review of Systems  Review of Systems  Constitutional: Negative.   HENT: Negative.    Cardiovascular: Negative.   Gastrointestinal: Negative.   Allergic/Immunologic: Negative.   Neurological: Negative.   Psychiatric/Behavioral: Negative.       Objective:   BP 132/87   Pulse 70   Temp 98.4 F (36.9 C) (Oral)   Wt 203 lb (92.1 kg)   LMP 06/08/2022   SpO2 100%   BMI 33.78 kg/m   Wt Readings from Last 5 Encounters:  08/14/23 203 lb (92.1 kg)  06/13/23 205 lb 6.4 oz (93.2 kg)  11/26/22 202 lb (91.6 kg)  11/16/22 200 lb (90.7 kg)  09/10/22 203 lb 1.6 oz (92.1 kg)     Physical Exam Vitals and nursing note reviewed.  Constitutional:      General: She is not in acute distress.    Appearance: She is well-developed.  Cardiovascular:     Rate and  Rhythm: Normal rate and regular rhythm.  Pulmonary:     Effort: Pulmonary effort is normal.     Breath sounds: Normal breath sounds.  Neurological:     Mental Status: She is alert and oriented to person, place, and time.       Assessment & Plan:   Elevated TSH  Other orders -     Fluticasone  Propionate; Place 2 sprays into both nostrils daily.  Dispense: 16 g; Refill: 6  1. Elevated TSH (Primary)  Continue synthroid  Follow  up:  Follow up as scheduled   Return if symptoms worsen or fail to improve.   Jerrlyn Morel, NP 08/14/2023

## 2023-08-14 NOTE — Telephone Encounter (Signed)
 Copied from CRM [id number]. Topic: Clinical - Lab/Test Results >> Aug 13, 2023  4:41 PM DeAngela L wrote: Reason for CRM: read message from provider to patient and scheduled 6 week appt  E2C2 made appt. KH

## 2023-09-26 ENCOUNTER — Ambulatory Visit: Payer: Self-pay | Admitting: Nurse Practitioner

## 2023-09-26 ENCOUNTER — Encounter: Payer: Self-pay | Admitting: Nurse Practitioner

## 2023-09-26 VITALS — BP 121/79 | HR 80 | Temp 97.7°F | Wt 202.0 lb

## 2023-09-26 DIAGNOSIS — E038 Other specified hypothyroidism: Secondary | ICD-10-CM

## 2023-09-26 DIAGNOSIS — R7303 Prediabetes: Secondary | ICD-10-CM | POA: Diagnosis not present

## 2023-09-26 DIAGNOSIS — M79671 Pain in right foot: Secondary | ICD-10-CM | POA: Diagnosis not present

## 2023-09-26 DIAGNOSIS — M79672 Pain in left foot: Secondary | ICD-10-CM | POA: Diagnosis not present

## 2023-09-26 MED ORDER — PREDNISONE 20 MG PO TABS: 20.0000 mg | ORAL_TABLET | Freq: Every day | ORAL | 0 refills | Status: AC

## 2023-09-26 NOTE — Progress Notes (Signed)
 Subjective   Patient ID: Brenda Bell, female    DOB: 1971/01/21, 53 y.o.   MRN: [id number]  Chief Complaint  Patient presents with   Follow-up    Referring provider: Oley Bascom RAMAN, NP  Brenda Bell is a 53 y.o. female with Past Medical History: No date: Abnormal uterine bleeding (AUB) 08/20/2013: Anemia, iron  deficiency No date: Asthma No date: B12 deficiency 07/30/2022: Cervical stenosis (uterine cervix) No date: Complication of anesthesia 2017: History of gastritis No date: Hypotension after procedure No date: Pre-diabetes No date: Stenosis of cervix No date: Uterine fibroid No date: Vitamin D  deficiency   HPI  Patient presents today for a follow-up visit.  Overall she has been doing well.  At last visit here her thyroid  was abnormal.  She was prescribed Synthroid  has been compliant and feels much better.   Denies f/c/s, n/v/d, hemoptysis, PND, leg swelling. Denies chest pain or edema.  Patient also complains of bilateral foot pain with some redness that is worse at night.  We will trial a round of prednisone  for this.   Allergies  Allergen Reactions   Hydrocodone  Nausea And Vomiting    And decreased b/p    Immunization History  Administered Date(s) Administered   Influenza Split 01/29/2013   Influenza Whole 01/17/2007   Influenza, Quadrivalent, Recombinant, Inj, Pf 02/02/2019   Influenza,inj,Quad PF,6+ Mos 01/20/2015, 02/09/2016, 02/25/2017, 01/23/2018, 05/09/2022   Influenza-Unspecified 02/14/2021   Pneumococcal Polysaccharide-23 01/17/2007   Td 06/01/2006, 06/18/2006   Tdap 12/25/2012    Tobacco History: Social History   Tobacco Use  Smoking Status Never  Smokeless Tobacco Never   Counseling given: Not Answered   Outpatient Encounter Medications as of 09/26/2023  Medication Sig   levothyroxine  (SYNTHROID ) 50 MCG tablet Take 1 tablet (50 mcg total) by mouth daily.   meloxicam  (MOBIC ) 15 MG tablet Take 15 mg by mouth daily.   predniSONE   (DELTASONE ) 20 MG tablet Take 1 tablet (20 mg total) by mouth daily with breakfast for 5 days.   albuterol  (VENTOLIN  HFA) 108 (90 Base) MCG/ACT inhaler INHALE 1-2 PUFFS INTO THE LUNGS UP TO EVERY 6 HOURS AS NEEDED FOR WHEEZING/SHORTNESS OF BREATH (Patient not taking: Reported on 09/26/2023)   cetirizine  (ZYRTEC ) 10 MG tablet TAKE 1 TABLET BY MOUTH EVERY DAY (Patient not taking: Reported on 09/26/2023)   erythromycin  ophthalmic ointment Place 1 Application into the left eye 3 (three) times daily. (Patient not taking: Reported on 09/26/2023)   fluticasone  (FLONASE ) 50 MCG/ACT nasal spray Place 2 sprays into both nostrils daily. (Patient not taking: Reported on 09/26/2023)   fluticasone -salmeterol (ADVAIR HFA) 115-21 MCG/ACT inhaler Inhale 2 puffs into the lungs 2 (two) times daily. (Patient not taking: Reported on 09/26/2023)   promethazine -dextromethorphan (PROMETHAZINE -DM) 6.25-15 MG/5ML syrup Take 5 mLs by mouth 4 (four) times daily as needed for cough. (Patient not taking: Reported on 09/26/2023)   No facility-administered encounter medications on file as of 09/26/2023.    Review of Systems  Review of Systems  Constitutional: Negative.   HENT: Negative.    Cardiovascular: Negative.   Gastrointestinal: Negative.   Allergic/Immunologic: Negative.   Neurological: Negative.   Psychiatric/Behavioral: Negative.       Objective:   BP 121/79   Pulse 80   Temp 97.7 F (36.5 C)   Wt 202 lb (91.6 kg)   LMP 06/08/2022   SpO2 98%   BMI 33.61 kg/m   Wt Readings from Last 5 Encounters:  09/26/23 202 lb (91.6 kg)  08/14/23 203 lb (  92.1 kg)  06/13/23 205 lb 6.4 oz (93.2 kg)  11/26/22 202 lb (91.6 kg)  11/16/22 200 lb (90.7 kg)     Physical Exam Vitals and nursing note reviewed.  Constitutional:      General: She is not in acute distress.    Appearance: She is well-developed.   Cardiovascular:     Rate and Rhythm: Normal rate and regular rhythm.  Pulmonary:     Effort: Pulmonary effort  is normal.     Breath sounds: Normal breath sounds.   Neurological:     Mental Status: She is alert and oriented to person, place, and time.       Assessment & Plan:   Subclinical hypothyroidism -     Thyroid  Panel With TSH; Future -     Hemoglobin A1c  Prediabetes -     Hemoglobin A1c  Bilateral foot pain -     predniSONE ; Take 1 tablet (20 mg total) by mouth daily with breakfast for 5 days.  Dispense: 5 tablet; Refill: 0     Return in about 3 months (around 12/27/2023).   Bascom GORMAN Borer, NP 09/26/2023

## 2023-09-26 NOTE — Patient Instructions (Signed)

## 2023-09-27 ENCOUNTER — Other Ambulatory Visit: Payer: Self-pay | Admitting: Nurse Practitioner

## 2023-09-27 ENCOUNTER — Ambulatory Visit: Payer: Self-pay | Admitting: Nurse Practitioner

## 2023-09-27 DIAGNOSIS — E038 Other specified hypothyroidism: Secondary | ICD-10-CM

## 2023-09-27 LAB — THYROID PANEL WITH TSH
Free Thyroxine Index: 2.3 (ref 1.2–4.9)
T3 Uptake Ratio: 29 % (ref 24–39)
T4, Total: 8 ug/dL (ref 4.5–12.0)
TSH: 2.78 u[IU]/mL (ref 0.450–4.500)

## 2023-09-27 LAB — HEMOGLOBIN A1C
Est. average glucose Bld gHb Est-mCnc: 123 mg/dL
Hgb A1c MFr Bld: 5.9 % — ABNORMAL HIGH (ref 4.8–5.6)

## 2023-09-27 MED ORDER — LEVOTHYROXINE SODIUM 50 MCG PO TABS
50.0000 ug | ORAL_TABLET | Freq: Every day | ORAL | 11 refills | Status: DC
Start: 2023-09-27 — End: 2023-12-16

## 2023-12-16 ENCOUNTER — Ambulatory Visit: Payer: Self-pay | Admitting: Nurse Practitioner

## 2023-12-16 ENCOUNTER — Encounter: Payer: Self-pay | Admitting: Nurse Practitioner

## 2023-12-16 VITALS — BP 136/80 | HR 71 | Temp 98.1°F | Wt 205.6 lb

## 2023-12-16 DIAGNOSIS — E038 Other specified hypothyroidism: Secondary | ICD-10-CM

## 2023-12-16 DIAGNOSIS — N926 Irregular menstruation, unspecified: Secondary | ICD-10-CM | POA: Diagnosis not present

## 2023-12-16 DIAGNOSIS — R300 Dysuria: Secondary | ICD-10-CM | POA: Diagnosis not present

## 2023-12-16 LAB — POCT URINALYSIS DIP (CLINITEK)
Bilirubin, UA: NEGATIVE
Blood, UA: NEGATIVE
Glucose, UA: NEGATIVE mg/dL
Ketones, POC UA: NEGATIVE mg/dL
Leukocytes, UA: NEGATIVE
Nitrite, UA: NEGATIVE
POC PROTEIN,UA: NEGATIVE
Spec Grav, UA: 1.01 (ref 1.010–1.025)
Urobilinogen, UA: 0.2 U/dL
pH, UA: 7 (ref 5.0–8.0)

## 2023-12-16 MED ORDER — LEVOTHYROXINE SODIUM 50 MCG PO TABS: 50.0000 ug | ORAL_TABLET | Freq: Every day | ORAL | 11 refills | Status: DC

## 2023-12-16 NOTE — Progress Notes (Signed)
 Subjective   Patient ID: Brenda Bell, female    DOB: [birth year], 53 y.o.   MRN: [id number]  Chief Complaint  Patient presents with   Medical Management of Chronic Issues    Referring provider: Oley Bascom RAMAN, NP  Brenda Bell is a 53 y.o. female with Past Medical History: No date: Abnormal uterine bleeding (AUB) 08/20/2013: Anemia, iron  deficiency No date: Asthma No date: B12 deficiency 07/30/2022: Cervical stenosis (uterine cervix) No date: Complication of anesthesia 2017: History of gastritis No date: Hypotension after procedure No date: Pre-diabetes No date: Stenosis of cervix No date: Uterine fibroid No date: Vitamin D  deficiency   HPI  Patient presents today for a follow-up visit.  Overall she has been doing well.  At last visit here her thyroid  was abnormal.  She was prescribed Synthroid  has been compliant and feels much better.   Denies f/c/s, n/v/d, hemoptysis, PND, leg swelling. Denies chest pain or edema.   Patient is concerned about irregular menstrual cycles.  She states that she has stopped having cycles and just recently started having irregular cycles again but she has also noticed some green drainage.  We will place a referral to OB/GYN for her.  She would like a UA checked in office today.    Allergies  Allergen Reactions   Hydrocodone  Nausea And Vomiting    And decreased b/p    Immunization History  Administered Date(s) Administered   Influenza Split 01/29/2013   Influenza Whole 01/17/2007   Influenza, Quadrivalent, Recombinant, Inj, Pf 02/02/2019   Influenza,inj,Quad PF,6+ Mos 01/20/2015, 02/09/2016, 02/25/2017, 01/23/2018, 05/09/2022   Influenza-Unspecified 02/14/2021   Pneumococcal Polysaccharide-23 01/17/2007   Td 06/01/2006, 06/18/2006   Tdap 12/25/2012    Tobacco History: Social History   Tobacco Use  Smoking Status Never  Smokeless Tobacco Never   Counseling given: Not Answered   Outpatient Encounter Medications as of  12/16/2023  Medication Sig   meloxicam  (MOBIC ) 15 MG tablet Take 15 mg by mouth daily.   [DISCONTINUED] levothyroxine  (SYNTHROID ) 50 MCG tablet Take 1 tablet (50 mcg total) by mouth daily.   albuterol  (VENTOLIN  HFA) 108 (90 Base) MCG/ACT inhaler INHALE 1-2 PUFFS INTO THE LUNGS UP TO EVERY 6 HOURS AS NEEDED FOR WHEEZING/SHORTNESS OF BREATH (Patient not taking: Reported on 09/26/2023)   cetirizine  (ZYRTEC ) 10 MG tablet TAKE 1 TABLET BY MOUTH EVERY DAY (Patient not taking: Reported on 09/26/2023)   erythromycin  ophthalmic ointment Place 1 Application into the left eye 3 (three) times daily. (Patient not taking: Reported on 09/26/2023)   fluticasone  (FLONASE ) 50 MCG/ACT nasal spray Place 2 sprays into both nostrils daily. (Patient not taking: Reported on 09/26/2023)   fluticasone -salmeterol (ADVAIR HFA) 115-21 MCG/ACT inhaler Inhale 2 puffs into the lungs 2 (two) times daily. (Patient not taking: Reported on 09/26/2023)   levothyroxine  (SYNTHROID ) 50 MCG tablet Take 1 tablet (50 mcg total) by mouth daily.   promethazine -dextromethorphan (PROMETHAZINE -DM) 6.25-15 MG/5ML syrup Take 5 mLs by mouth 4 (four) times daily as needed for cough. (Patient not taking: Reported on 09/26/2023)   No facility-administered encounter medications on file as of 12/16/2023.    Review of Systems  Review of Systems  Constitutional: Negative.   HENT: Negative.    Cardiovascular: Negative.   Gastrointestinal: Negative.   Allergic/Immunologic: Negative.   Neurological: Negative.   Psychiatric/Behavioral: Negative.       Objective:   BP 136/80   Pulse 71   Temp 98.1 F (36.7 C) (Oral)   Wt 205 lb 9.6 oz (93.3  kg)   LMP 06/08/2022   SpO2 95%   BMI 34.21 kg/m   Wt Readings from Last 5 Encounters:  12/16/23 205 lb 9.6 oz (93.3 kg)  09/26/23 202 lb (91.6 kg)  08/14/23 203 lb (92.1 kg)  06/13/23 205 lb 6.4 oz (93.2 kg)  11/26/22 202 lb (91.6 kg)     Physical Exam Vitals and nursing note reviewed.   Constitutional:      General: She is not in acute distress.    Appearance: She is well-developed.  Cardiovascular:     Rate and Rhythm: Normal rate and regular rhythm.  Pulmonary:     Effort: Pulmonary effort is normal.     Breath sounds: Normal breath sounds.  Neurological:     Mental Status: She is alert and oriented to person, place, and time.       Assessment & Plan:   Irregular menstrual bleeding -     Ambulatory referral to Obstetrics / Gynecology  Subclinical hypothyroidism -     Levothyroxine  Sodium; Take 1 tablet (50 mcg total) by mouth daily.  Dispense: 30 tablet; Refill: 11  Dysuria -     POCT URINALYSIS DIP (CLINITEK)     Return in about 3 months (around 03/16/2024).   Bascom GORMAN Borer, NP 12/16/2023

## 2023-12-26 ENCOUNTER — Ambulatory Visit: Payer: Self-pay | Admitting: Nurse Practitioner

## 2024-03-16 ENCOUNTER — Ambulatory Visit: Payer: Self-pay | Admitting: Nurse Practitioner

## 2024-03-16 VITALS — BP 131/79 | HR 76 | Wt 206.2 lb

## 2024-03-16 DIAGNOSIS — E038 Other specified hypothyroidism: Secondary | ICD-10-CM | POA: Diagnosis not present

## 2024-03-16 DIAGNOSIS — N926 Irregular menstruation, unspecified: Secondary | ICD-10-CM | POA: Diagnosis not present

## 2024-03-16 MED ORDER — LEVOTHYROXINE SODIUM 50 MCG PO TABS: 50.0000 ug | ORAL_TABLET | Freq: Every day | ORAL | 11 refills | Status: AC

## 2024-03-16 NOTE — Progress Notes (Signed)
 Subjective   Patient ID: Brenda Bell, female    DOB: 04-Sep-1970, 53 y.o.   MRN: [id number]  Chief Complaint  Patient presents with   Follow-up    Thyroid     Labs Only    Referring provider: Oley Bascom RAMAN, NP  Brenda Bell is a 53 y.o. female with Past Medical History: No date: Abnormal uterine bleeding (AUB) 08/20/2013: Anemia, iron  deficiency No date: Asthma No date: B12 deficiency 07/30/2022: Cervical stenosis (uterine cervix) No date: Complication of anesthesia 2017: History of gastritis No date: Hypotension after procedure No date: Pre-diabetes No date: Stenosis of cervix No date: Uterine fibroid No date: Vitamin D  deficiency   HPI   Patient presents today for follow-up visit.  She is compliant with thyroid  medication does need a refill today.  Overall doing well.  She has been having some irregular menstrual cycles and does need a referral back to OB/GYN today.  Patient does have a history of prediabetes.  A1c in office today was 5.8.  Denies f/c/s, n/v/d, hemoptysis, PND, leg swelling. Denies chest pain or edema.     Allergies[1]  Immunization History  Administered Date(s) Administered   Influenza Split 01/29/2013   Influenza Whole 01/17/2007   Influenza, Quadrivalent, Recombinant, Inj, Pf 02/02/2019   Influenza,inj,Quad PF,6+ Mos 01/20/2015, 02/09/2016, 02/25/2017, 01/23/2018, 05/09/2022   Influenza-Unspecified 02/14/2021   Pneumococcal Polysaccharide-23 01/17/2007   Td 06/01/2006, 06/18/2006   Tdap 12/25/2012    Tobacco History: Tobacco Use History[2] Counseling given: Not Answered   Outpatient Encounter Medications as of 03/16/2024  Medication Sig   meloxicam  (MOBIC ) 15 MG tablet Take 15 mg by mouth daily.   [DISCONTINUED] levothyroxine  (SYNTHROID ) 50 MCG tablet Take 1 tablet (50 mcg total) by mouth daily.   albuterol  (VENTOLIN  HFA) 108 (90 Base) MCG/ACT inhaler INHALE 1-2 PUFFS INTO THE LUNGS UP TO EVERY 6 HOURS AS NEEDED FOR  WHEEZING/SHORTNESS OF BREATH (Patient not taking: Reported on 03/16/2024)   cetirizine  (ZYRTEC ) 10 MG tablet TAKE 1 TABLET BY MOUTH EVERY DAY (Patient not taking: Reported on 03/16/2024)   erythromycin  ophthalmic ointment Place 1 Application into the left eye 3 (three) times daily. (Patient not taking: Reported on 03/16/2024)   fluticasone  (FLONASE ) 50 MCG/ACT nasal spray Place 2 sprays into both nostrils daily. (Patient not taking: Reported on 03/16/2024)   fluticasone -salmeterol (ADVAIR HFA) 115-21 MCG/ACT inhaler Inhale 2 puffs into the lungs 2 (two) times daily. (Patient not taking: Reported on 03/16/2024)   levothyroxine  (SYNTHROID ) 50 MCG tablet Take 1 tablet (50 mcg total) by mouth daily.   promethazine -dextromethorphan (PROMETHAZINE -DM) 6.25-15 MG/5ML syrup Take 5 mLs by mouth 4 (four) times daily as needed for cough. (Patient not taking: Reported on 03/16/2024)   No facility-administered encounter medications on file as of 03/16/2024.    Review of Systems  Review of Systems  Constitutional: Negative.   HENT: Negative.    Cardiovascular: Negative.   Gastrointestinal: Negative.   Allergic/Immunologic: Negative.   Neurological: Negative.   Psychiatric/Behavioral: Negative.       Objective:   BP 131/79 (BP Location: Left Arm, Patient Position: Sitting)   Pulse 76   Wt 206 lb 3.2 oz (93.5 kg)   LMP 06/08/2022   SpO2 97%   BMI 34.31 kg/m   Wt Readings from Last 5 Encounters:  03/16/24 206 lb 3.2 oz (93.5 kg)  12/16/23 205 lb 9.6 oz (93.3 kg)  09/26/23 202 lb (91.6 kg)  08/14/23 203 lb (92.1 kg)  06/13/23 205 lb 6.4 oz (93.2 kg)  Physical Exam Vitals and nursing note reviewed.  Constitutional:      General: She is not in acute distress.    Appearance: She is well-developed.  Cardiovascular:     Rate and Rhythm: Normal rate and regular rhythm.  Pulmonary:     Effort: Pulmonary effort is normal.     Breath sounds: Normal breath sounds.  Neurological:     Mental  Status: She is alert and oriented to person, place, and time.       Assessment & Plan:   Irregular periods/menstrual cycles -     Ambulatory referral to Obstetrics / Gynecology  Subclinical hypothyroidism -     Thyroid  Panel With TSH; Future -     Levothyroxine  Sodium; Take 1 tablet (50 mcg total) by mouth daily.  Dispense: 30 tablet; Refill: 11     Return in about 3 months (around 06/14/2024).   Bascom GORMAN Borer, NP 03/16/2024     [1]  Allergies Allergen Reactions   Hydrocodone  Nausea And Vomiting    And decreased b/p  [2]  Social History Tobacco Use  Smoking Status Never  Smokeless Tobacco Never

## 2024-06-15 ENCOUNTER — Ambulatory Visit: Payer: Self-pay | Admitting: Nurse Practitioner
